# Patient Record
Sex: Female | Born: 1960
Health system: Southern US, Community
[De-identification: ages and names within clinical notes are randomized; demographics above are authoritative.]

## PROBLEM LIST (undated history)

## (undated) DIAGNOSIS — G43909 Migraine, unspecified, not intractable, without status migrainosus: Secondary | ICD-10-CM

## (undated) DIAGNOSIS — T7840XA Allergy, unspecified, initial encounter: Secondary | ICD-10-CM

## (undated) DIAGNOSIS — C50919 Malignant neoplasm of unspecified site of unspecified female breast: Secondary | ICD-10-CM

## (undated) DIAGNOSIS — G4730 Sleep apnea, unspecified: Secondary | ICD-10-CM

## (undated) DIAGNOSIS — R51 Headache: Secondary | ICD-10-CM

## (undated) DIAGNOSIS — R0789 Other chest pain: Secondary | ICD-10-CM

## (undated) DIAGNOSIS — C50411 Malignant neoplasm of upper-outer quadrant of right female breast: Secondary | ICD-10-CM

## (undated) DIAGNOSIS — R0600 Dyspnea, unspecified: Secondary | ICD-10-CM

## (undated) DIAGNOSIS — T8859XA Other complications of anesthesia, initial encounter: Secondary | ICD-10-CM

## (undated) DIAGNOSIS — D510 Vitamin B12 deficiency anemia due to intrinsic factor deficiency: Secondary | ICD-10-CM

## (undated) DIAGNOSIS — E039 Hypothyroidism, unspecified: Secondary | ICD-10-CM

## (undated) DIAGNOSIS — R310 Gross hematuria: Secondary | ICD-10-CM

## (undated) DIAGNOSIS — R0609 Other forms of dyspnea: Secondary | ICD-10-CM

## (undated) DIAGNOSIS — I10 Essential (primary) hypertension: Secondary | ICD-10-CM

## (undated) DIAGNOSIS — Z923 Personal history of irradiation: Secondary | ICD-10-CM

## (undated) DIAGNOSIS — M8580 Other specified disorders of bone density and structure, unspecified site: Secondary | ICD-10-CM

## (undated) DIAGNOSIS — Z1379 Encounter for other screening for genetic and chromosomal anomalies: Secondary | ICD-10-CM

## (undated) DIAGNOSIS — E785 Hyperlipidemia, unspecified: Secondary | ICD-10-CM

## (undated) DIAGNOSIS — D649 Anemia, unspecified: Secondary | ICD-10-CM

## (undated) DIAGNOSIS — Z8041 Family history of malignant neoplasm of ovary: Secondary | ICD-10-CM

## (undated) DIAGNOSIS — R002 Palpitations: Secondary | ICD-10-CM

## (undated) DIAGNOSIS — T4145XA Adverse effect of unspecified anesthetic, initial encounter: Secondary | ICD-10-CM

## (undated) DIAGNOSIS — R55 Syncope and collapse: Secondary | ICD-10-CM

## (undated) HISTORY — DX: Encounter for other screening for genetic and chromosomal anomalies: Z13.79

## (undated) HISTORY — DX: Vitamin B12 deficiency anemia due to intrinsic factor deficiency: D51.0

## (undated) HISTORY — PX: DILATION AND CURETTAGE OF UTERUS: SHX78

## (undated) HISTORY — DX: Other specified disorders of bone density and structure, unspecified site: M85.80

## (undated) HISTORY — DX: Other forms of dyspnea: R06.09

## (undated) HISTORY — PX: COLONOSCOPY: SHX174

## (undated) HISTORY — DX: Dyspnea, unspecified: R06.00

## (undated) HISTORY — DX: Malignant neoplasm of upper-outer quadrant of right female breast: C50.411

## (undated) HISTORY — DX: Hyperlipidemia, unspecified: E78.5

## (undated) HISTORY — DX: Migraine, unspecified, not intractable, without status migrainosus: G43.909

## (undated) HISTORY — DX: Syncope and collapse: R55

## (undated) HISTORY — PX: FUNCTIONAL ENDOSCOPIC SINUS SURGERY: SUR616

## (undated) HISTORY — DX: Headache: R51

## (undated) HISTORY — DX: Family history of malignant neoplasm of ovary: Z80.41

## (undated) HISTORY — DX: Allergy, unspecified, initial encounter: T78.40XA

## (undated) HISTORY — DX: Palpitations: R00.2

## (undated) HISTORY — DX: Anemia, unspecified: D64.9

## (undated) HISTORY — DX: Sleep apnea, unspecified: G47.30

## (undated) HISTORY — DX: Hypothyroidism, unspecified: E03.9

## (undated) HISTORY — PX: CHOLECYSTECTOMY: SHX55

## (undated) HISTORY — DX: Other chest pain: R07.89

## (undated) HISTORY — DX: Essential (primary) hypertension: I10

---
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
## 2003-09-18 ENCOUNTER — Other Ambulatory Visit: Payer: Self-pay

## 2004-04-01 ENCOUNTER — Ambulatory Visit: Payer: Self-pay | Admitting: Otolaryngology

## 2004-04-06 ENCOUNTER — Ambulatory Visit: Payer: Self-pay

## 2004-05-15 ENCOUNTER — Emergency Department: Payer: Self-pay | Admitting: Emergency Medicine

## 2005-04-07 ENCOUNTER — Ambulatory Visit: Payer: Self-pay

## 2006-09-19 ENCOUNTER — Ambulatory Visit: Payer: Self-pay | Admitting: Unknown Physician Specialty

## 2006-12-05 ENCOUNTER — Inpatient Hospital Stay: Payer: Self-pay | Admitting: Endocrinology

## 2008-01-02 ENCOUNTER — Ambulatory Visit: Payer: Self-pay

## 2008-01-02 ENCOUNTER — Ambulatory Visit: Payer: Self-pay | Admitting: Otolaryngology

## 2008-01-10 ENCOUNTER — Ambulatory Visit: Payer: Self-pay | Admitting: Otolaryngology

## 2008-02-08 DIAGNOSIS — G4730 Sleep apnea, unspecified: Secondary | ICD-10-CM

## 2008-02-08 HISTORY — DX: Sleep apnea, unspecified: G47.30

## 2008-10-21 ENCOUNTER — Observation Stay: Payer: Self-pay | Admitting: Surgery

## 2008-12-08 DIAGNOSIS — D510 Vitamin B12 deficiency anemia due to intrinsic factor deficiency: Secondary | ICD-10-CM | POA: Insufficient documentation

## 2009-02-07 HISTORY — PX: ABDOMINAL HYSTERECTOMY: SHX81

## 2009-05-01 ENCOUNTER — Ambulatory Visit: Payer: Self-pay

## 2009-05-08 ENCOUNTER — Ambulatory Visit: Payer: Self-pay | Admitting: Family Medicine

## 2009-12-30 ENCOUNTER — Ambulatory Visit: Payer: Self-pay

## 2010-01-07 ENCOUNTER — Ambulatory Visit: Payer: Self-pay

## 2010-01-09 LAB — PATHOLOGY REPORT

## 2010-07-12 ENCOUNTER — Ambulatory Visit: Payer: Self-pay

## 2010-12-27 ENCOUNTER — Ambulatory Visit: Payer: Self-pay | Admitting: Unknown Physician Specialty

## 2010-12-29 LAB — PATHOLOGY REPORT

## 2011-10-07 ENCOUNTER — Ambulatory Visit: Payer: Self-pay

## 2013-01-02 ENCOUNTER — Ambulatory Visit: Payer: Self-pay

## 2013-05-30 ENCOUNTER — Ambulatory Visit: Payer: Self-pay | Admitting: Family Medicine

## 2013-06-07 ENCOUNTER — Ambulatory Visit: Payer: Self-pay | Admitting: Family Medicine

## 2013-07-08 DIAGNOSIS — R51 Headache: Secondary | ICD-10-CM | POA: Insufficient documentation

## 2013-07-08 DIAGNOSIS — I10 Essential (primary) hypertension: Secondary | ICD-10-CM | POA: Insufficient documentation

## 2013-07-08 DIAGNOSIS — D649 Anemia, unspecified: Secondary | ICD-10-CM | POA: Insufficient documentation

## 2013-07-08 DIAGNOSIS — E785 Hyperlipidemia, unspecified: Secondary | ICD-10-CM | POA: Insufficient documentation

## 2013-07-08 DIAGNOSIS — R519 Headache, unspecified: Secondary | ICD-10-CM | POA: Insufficient documentation

## 2013-09-12 ENCOUNTER — Encounter: Payer: Self-pay | Admitting: *Deleted

## 2013-09-13 ENCOUNTER — Encounter: Payer: Self-pay | Admitting: Cardiovascular Disease

## 2013-09-13 ENCOUNTER — Ambulatory Visit (INDEPENDENT_AMBULATORY_CARE_PROVIDER_SITE_OTHER): Payer: 59 | Admitting: Cardiovascular Disease

## 2013-09-13 VITALS — BP 132/74 | HR 87 | Ht 61.0 in | Wt 270.2 lb

## 2013-09-13 DIAGNOSIS — R0609 Other forms of dyspnea: Secondary | ICD-10-CM | POA: Insufficient documentation

## 2013-09-13 DIAGNOSIS — R0602 Shortness of breath: Secondary | ICD-10-CM

## 2013-09-13 DIAGNOSIS — I10 Essential (primary) hypertension: Secondary | ICD-10-CM

## 2013-09-13 DIAGNOSIS — R0600 Dyspnea, unspecified: Secondary | ICD-10-CM | POA: Insufficient documentation

## 2013-09-13 DIAGNOSIS — R0789 Other chest pain: Secondary | ICD-10-CM

## 2013-09-13 MED ORDER — LOSARTAN POTASSIUM-HCTZ 50-12.5 MG PO TABS: 1.0000 | ORAL_TABLET | Freq: Every day | ORAL | Status: DC

## 2013-09-13 NOTE — Patient Instructions (Addendum)
Your physician has requested that you have an echocardiogram. Echocardiography is a painless test that uses sound waves to create images of your heart. It provides your doctor with information about the size and shape of your heart and how well your heart's chambers and valves are working. This procedure takes approximately one hour. There are no restrictions for this procedure.  Your physician has recommended you make the following change in your medication:  Stop Olmesartan  Start Losartan - HCTZ 50/12.5 mg once daily   Your physician recommends that you schedule a follow-up appointment in:  1 month

## 2013-09-13 NOTE — Progress Notes (Signed)
Primary care physician: Dr. Lovie Macadamia  HPI  This is a pleasant 53 year old female who is self-referred for evaluation of shortness of breath and hypertension. She has known history of hypertension, hypothyroidism, hyperlipidemia and obesity. She reports history of migraine headache which was being treated for verapamil which also treated hypertension. However, she had worsening control of blood pressure and was switched to diltiazem. She was also started on Olmesartan- hydrochlorothiazide. Since then, she had experienced significant diarrhea. She reports prolonged history of exertional dyspnea. She occasionally gets substernal chest pain and tightness mostly at rest when she is anxious. She has had increased stress at work recently. Recent labs were reviewed and overall were unremarkable. LDL was 127. TSH was normal. She had a previous stress test in 2008 which showed no evidence of ischemia. She is a lifelong nonsmoker and has no family history of premature coronary artery disease. She is a Naval architect with a stressful job.  Allergies  Allergen Reactions  . Niacin Anaphylaxis  . Iodine Hives  . Ivp Dye [Iodinated Diagnostic Agents]   . Morphine     Other reaction(s): Unknown  . Other     Other reaction(s): Unknown Contrast dye  . Penicillins     Other reaction(s): Unknown  . Shrimp [Shellfish Allergy]   . Cephalexin Rash  . Sulfa Antibiotics Rash     Current Outpatient Prescriptions on File Prior to Visit  Medication Sig Dispense Refill  . B Complex Vitamins (VITAMIN B COMPLEX IJ) Inject as directed every 30 (thirty) days.      . Cholecalciferol (VITAMIN D3) 2000 UNITS capsule Take 2,000 Units by mouth daily.      Marland Kitchen diltiazem (DILACOR XR) 240 MG 24 hr capsule Take 240 mg by mouth daily.      Marland Kitchen levothyroxine (SYNTHROID, LEVOTHROID) 150 MCG tablet Take 150 mcg by mouth daily before breakfast.       No current facility-administered medications on file prior to visit.      Past Medical History  Diagnosis Date  . Hyperlipidemia   . Headache(784.0)   . Anemia   . Pernicious anemia   . Sleep apnea   . Allergic state   . Hypothyroidism   . Migraines   . Osteopenia   . Hypertension      Past Surgical History  Procedure Laterality Date  . Cesarean section    . Cholecystectomy    . Abdominal hysterectomy    . Dilation and curettage of uterus    . Functional endoscopic sinus surgery    . Colonoscopy       Family History  Problem Relation Age of Onset  . Pulmonary embolism Mother   . Hypertension Mother   . Hyperlipidemia Mother   . Hypertension Father      History   Social History  . Marital Status: Married    Spouse Name: N/A    Number of Children: N/A  . Years of Education: N/A   Occupational History  . Not on file.   Social History Main Topics  . Smoking status: Never Smoker   . Smokeless tobacco: Not on file  . Alcohol Use: No  . Drug Use: No  . Sexual Activity: Not on file   Other Topics Concern  . Not on file   Social History Narrative  . No narrative on file     ROS A 10 point review of system was performed. It is negative other than that mentioned in the history of present illness.  PHYSICAL EXAM   BP 132/74  Pulse 87  Ht 5\' 1"  (1.549 m)  Wt 270 lb 4 oz (122.585 kg)  BMI 51.09 kg/m2 Constitutional: She is oriented to person, place, and time. She appears well-developed and well-nourished. No distress.  HENT: No nasal discharge.  Head: Normocephalic and atraumatic.  Eyes: Pupils are equal and round. No discharge.  Neck: Normal range of motion. Neck supple. No JVD present. No thyromegaly present.  Cardiovascular: Normal rate, regular rhythm, normal heart sounds. Exam reveals no gallop and no friction rub. No murmur heard.  Pulmonary/Chest: Effort normal and breath sounds normal. No stridor. No respiratory distress. She has no wheezes. She has no rales. She exhibits no tenderness.  Abdominal: Soft.  Bowel sounds are normal. She exhibits no distension. There is no tenderness. There is no rebound and no guarding.  Musculoskeletal: Normal range of motion. She exhibits no edema and no tenderness.  Neurological: She is alert and oriented to person, place, and time. Coordination normal.  Skin: Skin is warm and dry. No rash noted. She is not diaphoretic. No erythema. No pallor.  Psychiatric: She has a normal mood and affect. Her behavior is normal. Judgment and thought content normal.     [id number]  Rhythm  Low voltage in precordial leads.   ABNORMAL     ASSESSMENT AND PLAN

## 2013-09-13 NOTE — Assessment & Plan Note (Signed)
Exertional dyspnea is likely multifactorial due to obesity, physical deconditioning and possible diastolic heart failure. I requested an echocardiogram for evaluation. She also has multiple risk factors for coronary artery disease and at some point we should consider stress testing.

## 2013-09-13 NOTE — Assessment & Plan Note (Signed)
Blood pressure is reasonably controlled. However, she is having diarrhea likely related to treatment with olmesartan. Thus, I switched her to losartan-hydrochlorothiazide.

## 2013-09-27 ENCOUNTER — Other Ambulatory Visit: Payer: Self-pay

## 2013-09-27 ENCOUNTER — Other Ambulatory Visit (INDEPENDENT_AMBULATORY_CARE_PROVIDER_SITE_OTHER): Payer: 59

## 2013-09-27 DIAGNOSIS — R079 Chest pain, unspecified: Secondary | ICD-10-CM

## 2013-09-27 DIAGNOSIS — R0602 Shortness of breath: Secondary | ICD-10-CM

## 2013-10-01 ENCOUNTER — Telehealth: Payer: Self-pay | Admitting: *Deleted

## 2013-10-01 NOTE — Telephone Encounter (Signed)
Her BP machine is probably not accurate. It is unusual for diastolic pressure to be this high when the systolic pressure is completely normal.

## 2013-10-01 NOTE — Telephone Encounter (Signed)
Patient called again please use her work or cell phone.

## 2013-10-01 NOTE — Telephone Encounter (Signed)
LVM 8/25

## 2013-10-01 NOTE — Telephone Encounter (Signed)
Informed patient of Dr. Jacklynn Ganong response  She stated that she will stop on the way home from work today and have it checked at urgent care I instructed her to call and let us know if blood pressure if her SBP remains elevated  Patient verbalized understanding

## 2013-10-01 NOTE — Telephone Encounter (Signed)
Patient wanted to let Dr. Fletcher Anon know that her systolic pressure is still running high  On average blood pressure is around 115/95 Pressure has been reaching 115/105  Her meds were changed 08/07  Your physician has recommended you make the following change in your medication:  Stop Olmesartan  Start Losartan - HCTZ 50/12.5 mg once daily

## 2013-10-02 ENCOUNTER — Telehealth: Payer: Self-pay

## 2013-10-02 DIAGNOSIS — I10 Essential (primary) hypertension: Secondary | ICD-10-CM

## 2013-10-02 NOTE — Telephone Encounter (Signed)
Pt left vm last night at 6:30 stating she went to [facility] acute care for them to take her BP it was 164/96. States he home BP readings were accurate. Please call.

## 2013-10-02 NOTE — Telephone Encounter (Signed)
Patient went to urgent care after she called [place] yesterday regarding her elevated diastolic blood pressure  Her pressure at urgent care was 164/96

## 2013-10-03 MED ORDER — LOSARTAN POTASSIUM-HCTZ 100-25 MG PO TABS: 1.0000 | ORAL_TABLET | Freq: Every day | ORAL | Status: DC

## 2013-10-03 NOTE — Telephone Encounter (Signed)
Increase Losartan-HCTZ to 100-25 mg once daily. Check BMP in 1 week.

## 2013-10-03 NOTE — Telephone Encounter (Signed)
Informed patient of Dr. Jacklynn Ganong recommendation Appt scheduled  Patient verbalized understanding

## 2013-10-10 ENCOUNTER — Ambulatory Visit (INDEPENDENT_AMBULATORY_CARE_PROVIDER_SITE_OTHER): Payer: 59 | Admitting: *Deleted

## 2013-10-10 DIAGNOSIS — I10 Essential (primary) hypertension: Secondary | ICD-10-CM

## 2013-10-11 LAB — BASIC METABOLIC PANEL
BUN/Creatinine Ratio: 20 (ref 9–23)
BUN: 16 mg/dL (ref 6–24)
CALCIUM: 9.6 mg/dL (ref 8.7–10.2)
CHLORIDE: 97 mmol/L (ref 97–108)
CO2: 21 mmol/L (ref 18–29)
Creatinine, Ser: 0.79 mg/dL (ref 0.57–1.00)
GFR calc Af Amer: 99 mL/min/{1.73_m2} (ref >59)
GFR calc non Af Amer: 86 mL/min/{1.73_m2} (ref >59)
GLUCOSE: 88 mg/dL (ref 65–99)
Potassium: 4.3 mmol/L (ref 3.5–5.2)
Sodium: 140 mmol/L (ref 134–144)

## 2013-10-18 ENCOUNTER — Ambulatory Visit: Payer: 59 | Admitting: Cardiovascular Disease

## 2013-10-29 ENCOUNTER — Ambulatory Visit: Payer: 59 | Admitting: Cardiovascular Disease

## 2013-11-12 ENCOUNTER — Encounter: Payer: Self-pay | Admitting: Cardiovascular Disease

## 2013-11-12 ENCOUNTER — Ambulatory Visit (INDEPENDENT_AMBULATORY_CARE_PROVIDER_SITE_OTHER): Payer: 59 | Admitting: Cardiovascular Disease

## 2013-11-12 VITALS — BP 128/90 | HR 76 | Ht 61.0 in | Wt 273.2 lb

## 2013-11-12 DIAGNOSIS — R0602 Shortness of breath: Secondary | ICD-10-CM

## 2013-11-12 DIAGNOSIS — I10 Essential (primary) hypertension: Secondary | ICD-10-CM

## 2013-11-12 NOTE — Assessment & Plan Note (Signed)
She has not been able to lose weight in spite of multiple attempts. I discussed the option of surgical weight loss and referred her to the weight loss clinic at [facility].

## 2013-11-12 NOTE — Progress Notes (Signed)
Primary care physician: Dr. Lovie Macadamia  HPI  This is a pleasant 53 year old female who is here today for a followup visit regarding  shortness of breath and hypertension. She has known history of hypertension, hypothyroidism, hyperlipidemia and obesity. She reports history of migraine headache which was being treated for verapamil which also treated hypertension. However, she had worsening control of blood pressure and was switched to diltiazem. During last visit, I switched her from Olmesartan- hydrochlorothiazide to losartan- hydrochlorothiazide due to diarrhea.  Blood pressure improved after increasing the dose. She reports resolution of diarrhea and overall feeling better. She continues to have significant exertional dyspnea and she has been trying to exercise on the elliptical but there is still limitations related to dyspnea. She denies any chest discomfort. Echocardiogram was normal.  Allergies  Allergen Reactions  . Niacin Anaphylaxis  . Iodine Hives  . Ivp Dye [Iodinated Diagnostic Agents]   . Morphine     Other reaction(s): Unknown  . Other     Other reaction(s): Unknown Contrast dye  . Penicillins     Other reaction(s): Unknown  . Shrimp [Shellfish Allergy]   . Cephalexin Rash  . Sulfa Antibiotics Rash     Current Outpatient Prescriptions on File Prior to Visit  Medication Sig Dispense Refill  . B Complex Vitamins (VITAMIN B COMPLEX IJ) Inject as directed every 30 (thirty) days.      . Cholecalciferol (VITAMIN D3) 2000 UNITS capsule Take 2,000 Units by mouth daily.      Marland Kitchen diltiazem (DILACOR XR) 240 MG 24 hr capsule Take 240 mg by mouth daily.      Marland Kitchen levothyroxine (SYNTHROID, LEVOTHROID) 150 MCG tablet Take 150 mcg by mouth daily before breakfast.      . losartan-hydrochlorothiazide (HYZAAR) 100-25 MG per tablet Take 1 tablet by mouth daily.  90 tablet  3   No current facility-administered medications on file prior to visit.     Past Medical History  Diagnosis Date  .  Hyperlipidemia   . Headache(784.0)   . Anemia   . Pernicious anemia   . Sleep apnea   . Allergic state   . Hypothyroidism   . Migraines   . Osteopenia   . Hypertension      Past Surgical History  Procedure Laterality Date  . Cesarean section    . Cholecystectomy    . Abdominal hysterectomy    . Dilation and curettage of uterus    . Functional endoscopic sinus surgery    . Colonoscopy       Family History  Problem Relation Age of Onset  . Pulmonary embolism Mother   . Hypertension Mother   . Hyperlipidemia Mother   . Hypertension Father      History   Social History  . Marital Status: Married    Spouse Name: N/A    Number of Children: N/A  . Years of Education: N/A   Occupational History  . Not on file.   Social History Main Topics  . Smoking status: Never Smoker   . Smokeless tobacco: Not on file  . Alcohol Use: No  . Drug Use: No  . Sexual Activity: Not on file   Other Topics Concern  . Not on file   Social History Narrative  . No narrative on file     ROS A 10 point review of system was performed. It is negative other than that mentioned in the history of present illness.   PHYSICAL EXAM   BP 128/90  Pulse 76  Ht 5\' 1"  (1.549 m)  Wt 273 lb 4 oz (123.945 kg)  BMI 51.66 kg/m2 Constitutional: She is oriented to person, place, and time. She appears well-developed and well-nourished. No distress.  HENT: No nasal discharge.  Head: Normocephalic and atraumatic.  Eyes: Pupils are equal and round. No discharge.  Neck: Normal range of motion. Neck supple. No JVD present. No thyromegaly present.  Cardiovascular: Normal rate, regular rhythm, normal heart sounds. Exam reveals no gallop and no friction rub. No murmur heard.  Pulmonary/Chest: Effort normal and breath sounds normal. No stridor. No respiratory distress. She has no wheezes. She has no rales. She exhibits no tenderness.  Abdominal: Soft. Bowel sounds are normal. She exhibits no distension.  There is no tenderness. There is no rebound and no guarding.  Musculoskeletal: Normal range of motion. She exhibits no edema and no tenderness.  Neurological: She is alert and oriented to person, place, and time. Coordination normal.  Skin: Skin is warm and dry. No rash noted. She is not diaphoretic. No erythema. No pallor.  Psychiatric: She has a normal mood and affect. Her behavior is normal. Judgment and thought content normal.        ASSESSMENT AND PLAN

## 2013-11-12 NOTE — Assessment & Plan Note (Signed)
Blood pressure is now well controlled on current medications without significant side effects. Continue same medications.

## 2013-11-12 NOTE — Patient Instructions (Addendum)
[place]  Your caregiver has ordered a Stress Test with nuclear imaging. The purpose of this test is to evaluate the blood supply to your heart muscle. This procedure is referred to as a "Non-Invasive Stress Test." This is because other than having an IV started in your vein, nothing is inserted or "invades" your body. Cardiac stress tests are done to find areas of poor blood flow to the heart by determining the extent of coronary artery disease (CAD). Some patients exercise on a treadmill, which naturally increases the blood flow to your heart, while others who are  unable to walk on a treadmill due to physical limitations have a pharmacologic/chemical stress agent called Lexiscan . This medicine will mimic walking on a treadmill by temporarily increasing your coronary blood flow.   Please note: these test may take anywhere between 2-4 hours to complete  PLEASE REPORT TO [facility] AT THE FIRST DESK WILL DIRECT YOU WHERE TO GO  Date of Procedure:________10/16/15_____________________________  Arrival Time for Procedure:____0745 am__________________________  PLEASE NOTIFY THE OFFICE AT LEAST 24 HOURS IN ADVANCE IF YOU ARE UNABLE TO KEEP YOUR APPOINTMENT.  [phone] AND  PLEASE NOTIFY NUCLEAR MEDICINE AT [facility] AT LEAST 24 HOURS IN ADVANCE IF YOU ARE UNABLE TO KEEP YOUR APPOINTMENT. [phone]  How to prepare for your Myoview test:  1. Do not eat or drink after midnight 2. No caffeine for 24 hours prior to test 3. No smoking 24 hours prior to test. 4. Your medication may be taken with water.  If your doctor stopped a medication because of this test, do not take that medication. 5. Ladies, please do not wear dresses.  Skirts or pants are appropriate. Please wear a short sleeve shirt. 6. No perfume, cologne or lotion. 7. Wear comfortable walking shoes. No heels!   You registered for [facility] for Weight Loss  [facility]   Tuesday, November 26, 2013  6-8 pm  [phone] Call for changes or questions   Your physician wants you to follow-up in: 6 months with Dr. Fletcher Anon. You will receive a reminder letter in the mail two months in advance. If you don't receive a letter, please call our office to schedule the follow-up appointment.

## 2013-11-12 NOTE — Assessment & Plan Note (Signed)
Echocardiogram was normal. She continues to have significant exertional dyspnea without chest discomfort. I recommend evaluation with a pharmacologic nuclear stress test to rule out ischemic etiology. She is not able to exercise on a treadmill due to significant knee arthritis.

## 2013-11-22 ENCOUNTER — Ambulatory Visit: Payer: Self-pay | Admitting: Cardiovascular Disease

## 2013-11-22 DIAGNOSIS — R0602 Shortness of breath: Secondary | ICD-10-CM

## 2013-11-25 ENCOUNTER — Telehealth: Payer: Self-pay | Admitting: *Deleted

## 2013-11-25 ENCOUNTER — Other Ambulatory Visit: Payer: Self-pay

## 2013-11-25 DIAGNOSIS — R0602 Shortness of breath: Secondary | ICD-10-CM

## 2013-11-25 NOTE — Telephone Encounter (Signed)
Please call patient. She wants her stress test results

## 2013-11-27 NOTE — Telephone Encounter (Signed)
See result note.  

## 2014-01-06 ENCOUNTER — Telehealth: Payer: Self-pay

## 2014-01-06 NOTE — Telephone Encounter (Signed)
Pt called, states she has been on new BP medication, and asks does she need to have her labs rechecked. Please call and advise.

## 2014-01-06 NOTE — Telephone Encounter (Signed)
I spoke with the patient and informed her that we should not need to recheck her labs until she follows up with Dr. Fletcher Anon in 6 months. I have advised her if she starts to have persistent leg cramping to call and let us know and we can recheck her potassium since she is on the losartan/ hctz.

## 2014-02-04 LAB — HM PAP SMEAR

## 2014-02-07 DIAGNOSIS — Z1379 Encounter for other screening for genetic and chromosomal anomalies: Secondary | ICD-10-CM

## 2014-02-07 HISTORY — DX: Encounter for other screening for genetic and chromosomal anomalies: Z13.79

## 2014-02-15 DIAGNOSIS — R310 Gross hematuria: Secondary | ICD-10-CM | POA: Insufficient documentation

## 2014-03-10 ENCOUNTER — Ambulatory Visit: Payer: Self-pay

## 2014-03-10 LAB — HM MAMMOGRAPHY

## 2014-08-19 ENCOUNTER — Telehealth: Payer: Self-pay | Admitting: Cardiovascular Disease

## 2014-08-19 NOTE — Telephone Encounter (Signed)
Pt c/o Syncope: STAT if syncope occurred within 30 minutes and pt complains of lightheadedness High Priority if episode of passing out, completely, today or in last 24 hours   1. Did you pass out today? no  2. When is the last time you passed out?  About 1 month ago   3. Has this occurred multiple times? Yes, twice   4. Did you have any symptoms prior to passing out?  Checked bp after 80's/40's        Patient was rx 2 additional bp meds by Dr. Lovie Macadamia and the 2 times she took she passed out as her bp dropped too low and she has not taken              since those episodes and has since not had any episodes    bp readings have been ok lately  120's/70's  140's/90's  sometimes high in the evening as she has a stressful day

## 2014-08-20 NOTE — Telephone Encounter (Signed)
S/w pt who indicated her PCP ordered additional BP med in May due to elevated BP at [facility]. States when she took additional med (she thinks it is coreg), she felt dizzy, pressure was 80s/40s. Waited few days, took med again and became symptomatic again. Has not taken additional BP med since then and states pressure in 120s. Suggested to pt she keep daily BP log and medications taken and bring to August appt with Arida. Pt verbalized understanding with no further questions.

## 2014-09-30 ENCOUNTER — Encounter: Payer: Self-pay | Admitting: Cardiovascular Disease

## 2014-09-30 ENCOUNTER — Ambulatory Visit (INDEPENDENT_AMBULATORY_CARE_PROVIDER_SITE_OTHER): Payer: 59 | Admitting: Cardiovascular Disease

## 2014-09-30 VITALS — BP 156/82 | HR 89 | Ht 61.0 in | Wt 275.2 lb

## 2014-09-30 DIAGNOSIS — R0602 Shortness of breath: Secondary | ICD-10-CM | POA: Diagnosis not present

## 2014-09-30 DIAGNOSIS — I10 Essential (primary) hypertension: Secondary | ICD-10-CM

## 2014-09-30 NOTE — Assessment & Plan Note (Signed)
She is considering lap band procedure. No preoperative cardiovascular evaluation would be needed given that her test were normal last year. She is at low risk from a cardiac standpoint.

## 2014-09-30 NOTE — Assessment & Plan Note (Signed)
Blood pressure is overall reasonably controlled. I'm hesitant to increase or add other antihypertensives medications given that she occasionally has low blood pressure readings at home. Continue observation and lifestyle changes.

## 2014-09-30 NOTE — Progress Notes (Signed)
Primary care physician: Dr. Lovie Macadamia  HPI  This is a pleasant 54 year old female who is here today for a followup visit regarding  shortness of breath and hypertension. She has known history of hypertension, hypothyroidism, sleep apnea on CPAP, migraine, hyperlipidemia and obesity.  dyspnea. She denies any chest discomfort.  Echocardiogram in August 2015 was normal. Nuclear stress test in March 2015 was done or shortness of breath and was normal. Blood pressure has been reasonably controlled on diltiazem and losartan-hydrochlorothiazide. She reports no side effects. She has been more busy lately at work and has been traveling frequently. I reviewed her home blood pressure readings and she had few readings about 540 systolic. At the same time, she had readings of 981 systolic. The blood pressure tends to fluctuate.  Allergies  Allergen Reactions  . Niacin Anaphylaxis  . Iodine Hives  . Ivp Dye [Iodinated Diagnostic Agents]   . Morphine     Other reaction(s): Unknown  . Other     Other reaction(s): Unknown Contrast dye  . Penicillins     Other reaction(s): Unknown  . Shrimp [Shellfish Allergy]   . Cephalexin Rash  . Sulfa Antibiotics Rash     Current Outpatient Prescriptions on File Prior to Visit  Medication Sig Dispense Refill  . B Complex Vitamins (VITAMIN B COMPLEX IJ) Inject as directed every 30 (thirty) days.    . Cholecalciferol (VITAMIN D3) 2000 UNITS capsule Take 4,000 Units by mouth daily.     Marland Kitchen diltiazem (DILACOR XR) 240 MG 24 hr capsule Take 240 mg by mouth daily.    Marland Kitchen levothyroxine (SYNTHROID, LEVOTHROID) 150 MCG tablet Take 150 mcg by mouth daily before breakfast.    . losartan-hydrochlorothiazide (HYZAAR) 100-25 MG per tablet Take 1 tablet by mouth daily. 90 tablet 3   No current facility-administered medications on file prior to visit.     Past Medical History  Diagnosis Date  . Hyperlipidemia   . Headache(784.0)   . Anemia   . Pernicious anemia   . Sleep  apnea   . Allergic state   . Hypothyroidism   . Migraines   . Osteopenia   . Hypertension      Past Surgical History  Procedure Laterality Date  . Cesarean section    . Cholecystectomy    . Abdominal hysterectomy    . Dilation and curettage of uterus    . Functional endoscopic sinus surgery    . Colonoscopy       Family History  Problem Relation Age of Onset  . Pulmonary embolism Mother   . Hypertension Mother   . Hyperlipidemia Mother   . Hypertension Father      Social History   Social History  . Marital Status: Married    Spouse Name: N/A  . Number of Children: N/A  . Years of Education: N/A   Occupational History  . Not on file.   Social History Main Topics  . Smoking status: Never Smoker   . Smokeless tobacco: Not on file  . Alcohol Use: No  . Drug Use: No  . Sexual Activity: Not on file   Other Topics Concern  . Not on file   Social History Narrative     ROS A 10 point review of system was performed. It is negative other than that mentioned in the history of present illness.   PHYSICAL EXAM   BP 156/82 mmHg  Pulse 89  Ht 5\' 1"  (1.549 m)  Wt 275 lb 4 oz (124.853 kg)  BMI 52.04 kg/m2 Constitutional: She is oriented to person, place, and time. She appears well-developed and well-nourished. No distress.  HENT: No nasal discharge.  Head: Normocephalic and atraumatic.  Eyes: Pupils are equal and round. No discharge.  Neck: Normal range of motion. Neck supple. No JVD present. No thyromegaly present.  Cardiovascular: Normal rate, regular rhythm, normal heart sounds. Exam reveals no gallop and no friction rub. No murmur heard.  Pulmonary/Chest: Effort normal and breath sounds normal. No stridor. No respiratory distress. She has no wheezes. She has no rales. She exhibits no tenderness.  Abdominal: Soft. Bowel sounds are normal. She exhibits no distension. There is no tenderness. There is no rebound and no guarding.  Musculoskeletal: Normal range  of motion. She exhibits no edema and no tenderness.  Neurological: She is alert and oriented to person, place, and time. Coordination normal.  Skin: Skin is warm and dry. No rash noted. She is not diaphoretic. No erythema. No pallor.  Psychiatric: She has a normal mood and affect. Her behavior is normal. Judgment and thought content normal.     BWL:SLHTD  Rhythm  WITHIN NORMAL LIMITS   ASSESSMENT AND PLAN

## 2014-09-30 NOTE — Assessment & Plan Note (Signed)
There is probably a component of physical deconditioning. Cardiac workup last year was negative including an echocardiogram and a stress test.

## 2014-09-30 NOTE — Patient Instructions (Signed)
Medication Instructions: Continue same medications.   Labwork: None.   Procedures/Testing: None.   Follow-Up: Follow up as needed.   Any Additional Special Instructions Will Be Listed Below (If Applicable).

## 2014-10-07 ENCOUNTER — Encounter: Payer: Self-pay | Admitting: Urgent Care

## 2014-10-07 ENCOUNTER — Emergency Department
Admission: EM | Admit: 2014-10-07 | Discharge: 2014-10-08 | Disposition: A | Discharge status: Home / Self Care | Payer: 59 | Attending: Student | Admitting: Student

## 2014-10-07 ENCOUNTER — Emergency Department: Payer: 59

## 2014-10-07 DIAGNOSIS — Z79899 Other long term (current) drug therapy: Secondary | ICD-10-CM | POA: Diagnosis not present

## 2014-10-07 DIAGNOSIS — R109 Unspecified abdominal pain: Secondary | ICD-10-CM

## 2014-10-07 DIAGNOSIS — N390 Urinary tract infection, site not specified: Secondary | ICD-10-CM

## 2014-10-07 DIAGNOSIS — I10 Essential (primary) hypertension: Secondary | ICD-10-CM | POA: Insufficient documentation

## 2014-10-07 DIAGNOSIS — Z880 Allergy status to penicillin: Secondary | ICD-10-CM | POA: Diagnosis not present

## 2014-10-07 DIAGNOSIS — R1031 Right lower quadrant pain: Secondary | ICD-10-CM | POA: Diagnosis present

## 2014-10-07 LAB — COMPREHENSIVE METABOLIC PANEL
ALBUMIN: 4.4 g/dL (ref 3.5–5.0)
ALK PHOS: 82 U/L (ref 38–126)
ALT: 18 U/L (ref 14–54)
AST: 25 U/L (ref 15–41)
Anion gap: 11 (ref 5–15)
BILIRUBIN TOTAL: 1 mg/dL (ref 0.3–1.2)
BUN: 18 mg/dL (ref 6–20)
CALCIUM: 9.8 mg/dL (ref 8.9–10.3)
CO2: 28 mmol/L (ref 22–32)
CREATININE: 0.78 mg/dL (ref 0.44–1.00)
Chloride: 100 mmol/L — ABNORMAL LOW (ref 101–111)
GFR calc Af Amer: >60 mL/min (ref >60)
GFR calc non Af Amer: >60 mL/min (ref >60)
GLUCOSE: 111 mg/dL — AB (ref 65–99)
Potassium: 3.5 mmol/L (ref 3.5–5.1)
Sodium: 139 mmol/L (ref 135–145)
TOTAL PROTEIN: 7.8 g/dL (ref 6.5–8.1)

## 2014-10-07 LAB — CBC
HEMATOCRIT: 44.3 % (ref 35.0–47.0)
HEMOGLOBIN: 14.9 g/dL (ref 12.0–16.0)
MCH: 29.3 pg (ref 26.0–34.0)
MCHC: 33.7 g/dL (ref 32.0–36.0)
MCV: 86.9 fL (ref 80.0–100.0)
Platelets: 322 10*3/uL (ref 150–440)
RBC: 5.09 MIL/uL (ref 3.80–5.20)
RDW: 13.5 % (ref 11.5–14.5)
WBC: 10.9 10*3/uL (ref 3.6–11.0)

## 2014-10-07 LAB — LIPASE, BLOOD: Lipase: 19 U/L — ABNORMAL LOW (ref 22–51)

## 2014-10-07 MED ORDER — SODIUM CHLORIDE 0.9 % IV BOLUS (SEPSIS)
1000.0000 mL | Freq: Once | INTRAVENOUS | Status: AC
  Administered 2014-10-07: 1000 mL via INTRAVENOUS

## 2014-10-07 MED ORDER — LORAZEPAM 2 MG/ML IJ SOLN
INTRAMUSCULAR | Status: AC
  Filled 2014-10-07: qty 1

## 2014-10-07 NOTE — ED Notes (Signed)
Patient presents with c/o SIGHT side abd pain; worse in RLQ. (+) nausea reported. Denies urinary symptoms. PMH significant for ovarian cysts.

## 2014-10-07 NOTE — ED Notes (Signed)
Patient offered IV interventions for pain, however declined citing that she has allergies to many medications. States, "I do not do well with narcotics." Patient initially anxious in triage, however when moved to subwait area and positioned for comfort in recliner her anxiety level decreased and patient appears to be more comfortable. Patient made aware of the fact that this RN had spoken with MD regarding her presenting c/o and triage assessment; MD had given orders for CT scan and IVFs; updated on POC as it stands at this point. Charge nurse made aware of the fact that patient is in [place] and will need a bed secondary to her acute onset of pain.

## 2014-10-07 NOTE — ED Provider Notes (Signed)
[facility] Emergency Department Provider Note  ____________________________________________  Time seen: Approximately 10:37 PM  I have reviewed the triage vital signs and the nursing notes.   HISTORY  Chief Complaint Abdominal Pain    HPI Tammy Turner is a 54 y.o. female With hyperlipidemia, pernicious anemia, hypertension, history of ovarian cysts, status post cholecystectomy, 2 x C-sections, hysterectomy who presents for evaluation of gradual onset constant throbbing right lower quadrant pain today. She has had nausea but no vomiting, no diarrhea, no fevers or chills. No chest pain or difficulty breathing. No abnormal vaginal bleeding or vaginal discharge.she denies any concern for sexual transmitted infection, she is in a mutual monogamous sexual relationship. No modifying factors although she reports her pain was severe initially however she received Ativan prior to me seeing her and she reports her pain is significantly improved at this time.   Past Medical History  Diagnosis Date  . Hyperlipidemia   . Headache(784.0)   . Anemia   . Pernicious anemia   . Sleep apnea   . Allergic state   . Hypothyroidism   . Migraines   . Osteopenia   . Hypertension     Patient Active Problem List   Diagnosis Date Noted  . Morbid obesity 11/12/2013  . SOB (shortness of breath) 09/13/2013  . Hypertension     Past Surgical History  Procedure Laterality Date  . Cesarean section    . Cholecystectomy    . Abdominal hysterectomy    . Dilation and curettage of uterus    . Functional endoscopic sinus surgery    . Colonoscopy      Current Outpatient Rx  Name  Route  Sig  Dispense  Refill  . B Complex Vitamins (VITAMIN B COMPLEX IJ)   Injection   Inject as directed every 30 (thirty) days.         . Cholecalciferol (VITAMIN D3) 2000 UNITS capsule   Oral   Take 4,000 Units by mouth daily.          Marland Kitchen diltiazem (DILACOR XR) 240 MG 24 hr capsule   Oral    Take 240 mg by mouth daily.         Marland Kitchen levothyroxine (SYNTHROID, LEVOTHROID) 150 MCG tablet   Oral   Take 150 mcg by mouth daily before breakfast.         . losartan-hydrochlorothiazide (HYZAAR) 100-25 MG per tablet   Oral   Take 1 tablet by mouth daily.   90 tablet   3     Allergies Niacin; Iodine; Ivp dye; Morphine; Other; Penicillins; Shrimp; Cephalexin; and Sulfa antibiotics  Family History  Problem Relation Age of Onset  . Pulmonary embolism Mother   . Hypertension Mother   . Hyperlipidemia Mother   . Hypertension Father     Social History Social History  Substance Use Topics  . Smoking status: Never Smoker   . Smokeless tobacco: None  . Alcohol Use: No    Review of Systems Constitutional: No fever/chills Eyes: No visual changes. ENT: No sore throat. Cardiovascular: Denies chest pain. Respiratory: Denies shortness of breath. Gastrointestinal: + abdominal pain.  + nausea, no vomiting.  No diarrhea.  No constipation. Genitourinary: Negative for dysuria. Musculoskeletal: Negative for back pain. Skin: Negative for rash. Neurological: Negative for headaches, focal weakness or numbness.  10-point ROS otherwise negative.  ____________________________________________   PHYSICAL EXAM:  VITAL SIGNS: ED Triage Vitals  Enc Vitals Group     BP 10/07/14 2049 134/94 mmHg  Pulse Rate 10/07/14 2049 95     Resp 10/07/14 2049 22     Temp 10/07/14 2049 98.1 F (36.7 C)     Temp Source 10/07/14 2049 Oral     SpO2 10/07/14 2049 96 %     Weight 10/07/14 2049 268 lb (121.564 kg)     Height 10/07/14 2049 5\' 3"  (1.6 m)     Head Cir --      Peak Flow --      Pain Score 10/07/14 2050 10     Pain Loc --      Pain Edu? --      Excl. in [place]? --     Constitutional: Alert and oriented. Well appearing and in no acute distress. Eyes: Conjunctivae are normal. PERRL. EOMI. Head: Atraumatic. Nose: No congestion/rhinnorhea. Mouth/Throat: Mucous membranes are moist.   Oropharynx non-erythematous. Neck: No stridor.   Cardiovascular: Normal rate, regular rhythm. Grossly normal heart sounds.  Good peripheral circulation. Respiratory: Normal respiratory effort.  No retractions. Lungs CTAB. Gastrointestinal: Soft with mild right lower quadrant tenderness. Normal bowel sounds. Obese body habitus. No distention. No abdominal bruits. No CVA tenderness. Genitourinary: deferred Musculoskeletal: No lower extremity tenderness nor edema.  No joint effusions. Neurologic:  Normal speech and language. No gross focal neurologic deficits are appreciated. No gait instability. Skin:  Skin is warm, dry and intact. No rash noted. Psychiatric: Mood and affect are normal. Speech and behavior are normal.  ____________________________________________   LABS (all labs ordered are listed, but only abnormal results are displayed)  Labs Reviewed  LIPASE, BLOOD - Abnormal; Notable for the following:    Lipase 19 (*)    All other components within normal limits  COMPREHENSIVE METABOLIC PANEL - Abnormal; Notable for the following:    Chloride 100 (*)    Glucose, Bld 111 (*)    All other components within normal limits  CBC  URINALYSIS COMPLETEWITH MICROSCOPIC ([facility] ONLY)   ____________________________________________  EKG  none ____________________________________________  RADIOLOGY  CT abdomen and pelvis  FINDINGS: The visualized lung bases are clear. A tiny hiatal hernia is noted.  The liver and spleen are unremarkable in appearance. The patient is status post cholecystectomy, with clips noted at the gallbladder fossa. The pancreas and adrenal glands are unremarkable.  The kidneys are unremarkable in appearance. There is no evidence of hydronephrosis. No renal or ureteral stones are seen. No perinephric stranding is appreciated.  No free fluid is identified. The small bowel is unremarkable in appearance. The stomach is within normal limits. No acute  vascular abnormalities are seen.  The appendix is normal in caliber, without evidence for appendicitis. The colon is unremarkable in appearance.  The bladder is mildly distended and grossly unremarkable. The patient is status post hysterectomy. The ovaries are grossly symmetric. No suspicious adnexal masses are seen. No inguinal lymphadenopathy is seen.  No acute osseous abnormalities are identified.  IMPRESSION: 1. No acute abnormality seen within the abdomen or pelvis. 2. Tiny hiatal hernia noted.  ____________________________________________   PROCEDURES  Procedure(s) performed: None  Critical Care performed: No  ____________________________________________   INITIAL IMPRESSION / ASSESSMENT AND PLAN / ED COURSE  Pertinent labs & imaging results that were available during my care of the patient were reviewed by me and considered in my medical decision making (see chart for details).  Tammy Turner is a 54 y.o. female With hyperlipidemia, pernicious anemia, hypertension, history of ovarian cysts, status post cholecystectomy, 2 x C-sections, hysterectomy who presents for evaluation of gradual onset  constant throbbing right lower quadrant pain today. On exam, she is well-appearing and in NAD. VSS and she is afebrile. She has mild right lower quadrant tenderness to palpation.CBC normal. Normal CMP. Normal lipase. CT of the abdomen and pelvis was obtained without contrast due to the patient's iodine allergy. Appendix is normal. Ovaries appear grossly normal bilaterally and no suspicious masses are noted making acute torsion less likely. She is status post hysterectomy. There is no acute abnormality noted on her CT scan; no CT findings to explain her pain. Her pain is improved at this time. We discussed extensive and meticulous return precautions and she is comfortable with discharge plan. Her urinalysis is pending. Care transferred to Dr. Beather Arbour at this 11:45 pm pending urinalysis after  which anticipate discharge home with close PCP follow-up. ____________________________________________   FINAL CLINICAL IMPRESSION(S) / ED DIAGNOSES  Final diagnoses:  RLQ abdominal pain      Joanne Gavel, MD 10/07/14 2355

## 2014-10-08 LAB — URINALYSIS COMPLETE WITH MICROSCOPIC (ARMC ONLY)
BACTERIA UA: NONE SEEN
Bilirubin Urine: NEGATIVE
GLUCOSE, UA: NEGATIVE mg/dL
Ketones, ur: NEGATIVE mg/dL
Nitrite: NEGATIVE
Protein, ur: NEGATIVE mg/dL
Specific Gravity, Urine: 1.008 (ref 1.005–1.030)
pH: 7 (ref 5.0–8.0)

## 2014-10-08 MED ORDER — NITROFURANTOIN MONOHYD MACRO 100 MG PO CAPS: 100.0000 mg | ORAL_CAPSULE | Freq: Two times a day (BID) | ORAL | Status: DC

## 2014-10-08 MED ORDER — NITROFURANTOIN MONOHYD MACRO 100 MG PO CAPS
100.0000 mg | ORAL_CAPSULE | Freq: Once | ORAL | Status: AC
  Administered 2014-10-08: 100 mg via ORAL
  Filled 2014-10-08: qty 1

## 2014-10-08 NOTE — Discharge Instructions (Signed)
1. Take antibiotics as prescribed (Macrobid 100 mg twice daily 7 days). 2. Return to the ER for worsening symptoms, persistent vomiting, fever or other concerns.  Abdominal Pain Many things can cause abdominal pain. Usually, abdominal pain is not caused by a disease and will improve without treatment. It can often be observed and treated at home. Your health care provider will do a physical exam and possibly order blood tests and X-rays to help determine the seriousness of your pain. However, in many cases, more time must pass before a clear cause of the pain can be found. Before that point, your health care provider may not know if you need more testing or further treatment. HOME CARE INSTRUCTIONS  Monitor your abdominal pain for any changes. The following actions may help to alleviate any discomfort you are experiencing:  Only take over-the-counter or prescription medicines as directed by your health care provider.  Do not take laxatives unless directed to do so by your health care provider.  Try a clear liquid diet (broth, tea, or water) as directed by your health care provider. Slowly move to a bland diet as tolerated. SEEK MEDICAL CARE IF:  You have unexplained abdominal pain.  You have abdominal pain associated with nausea or diarrhea.  You have pain when you urinate or have a bowel movement.  You experience abdominal pain that wakes you in the night.  You have abdominal pain that is worsened or improved by eating food.  You have abdominal pain that is worsened with eating fatty foods.  You have a fever. SEEK IMMEDIATE MEDICAL CARE IF:   Your pain does not go away within 2 hours.  You keep throwing up (vomiting).  Your pain is felt only in portions of the abdomen, such as the right side or the left lower portion of the abdomen.  You pass bloody or Freund tarry stools. MAKE SURE YOU:  Understand these instructions.   Will watch your condition.   Will get help right  away if you are not doing well or get worse.  Document Released: 11/03/2004 Document Revised: 01/29/2013 Document Reviewed: 10/03/2012 [facility] Patient Information 2015 [place], [place]. This information is not intended to replace advice given to you by your health care provider. Make sure you discuss any questions you have with your health care provider.  Urinary Tract Infection Urinary tract infections (UTIs) can develop anywhere along your urinary tract. Your urinary tract is your body's drainage system for removing wastes and extra water. Your urinary tract includes two kidneys, two ureters, a bladder, and a urethra. Your kidneys are a pair of bean-shaped organs. Each kidney is about the size of your fist. They are located below your ribs, one on each side of your spine. CAUSES Infections are caused by microbes, which are microscopic organisms, including fungi, viruses, and bacteria. These organisms are so small that they can only be seen through a microscope. Bacteria are the microbes that most commonly cause UTIs. SYMPTOMS  Symptoms of UTIs may vary by age and gender of the patient and by the location of the infection. Symptoms in young women typically include a frequent and intense urge to urinate and a painful, burning feeling in the bladder or urethra during urination. Older women and men are more likely to be tired, shaky, and weak and have muscle aches and abdominal pain. A fever may mean the infection is in your kidneys. Other symptoms of a kidney infection include pain in your back or sides below the ribs, nausea,  and vomiting. DIAGNOSIS To diagnose a UTI, your caregiver will ask you about your symptoms. Your caregiver also will ask to provide a urine sample. The urine sample will be tested for bacteria and white blood cells. White blood cells are made by your body to help fight infection. TREATMENT  Typically, UTIs can be treated with medication. Because most UTIs are caused by a bacterial  infection, they usually can be treated with the use of antibiotics. The choice of antibiotic and length of treatment depend on your symptoms and the type of bacteria causing your infection. HOME CARE INSTRUCTIONS  If you were prescribed antibiotics, take them exactly as your caregiver instructs you. Finish the medication even if you feel better after you have only taken some of the medication.  Drink enough water and fluids to keep your urine clear or pale yellow.  Avoid caffeine, tea, and carbonated beverages. They tend to irritate your bladder.  Empty your bladder often. Avoid holding urine for long periods of time.  Empty your bladder before and after sexual intercourse.  After a bowel movement, women should cleanse from front to back. Use each tissue only once. SEEK MEDICAL CARE IF:   You have back pain.  You develop a fever.  Your symptoms do not begin to resolve within 3 days. SEEK IMMEDIATE MEDICAL CARE IF:   You have severe back pain or lower abdominal pain.  You develop chills.  You have nausea or vomiting.  You have continued burning or discomfort with urination. MAKE SURE YOU:   Understand these instructions.  Will watch your condition.  Will get help right away if you are not doing well or get worse. Document Released: 11/03/2004 Document Revised: 07/26/2011 Document Reviewed: 03/04/2011 [facility] Patient Information 2015 [place], [place]. This information is not intended to replace advice given to you by your health care provider. Make sure you discuss any questions you have with your health care provider.

## 2014-10-08 NOTE — ED Provider Notes (Signed)
-----------------------------------------   12:53 AM on 10/08/2014 -----------------------------------------  Urinalysis noted with 2+ leukocytes, WBCs 6-30. Updated patient and spouse of results. Extensive discussion with patient regarding her allergies and sensitivities to antibiotics. Will treat with Macrobid. Patient declines anything for pain or nausea. Strict return precautions given. Patient and spouse verbalized understanding and agree with plan of care.  Paulette Blanch, MD 10/08/14 [phone]

## 2015-01-27 DIAGNOSIS — G43909 Migraine, unspecified, not intractable, without status migrainosus: Secondary | ICD-10-CM | POA: Insufficient documentation

## 2015-01-27 DIAGNOSIS — E039 Hypothyroidism, unspecified: Secondary | ICD-10-CM | POA: Diagnosis present

## 2015-01-27 DIAGNOSIS — I10 Essential (primary) hypertension: Secondary | ICD-10-CM | POA: Insufficient documentation

## 2015-01-27 DIAGNOSIS — G4730 Sleep apnea, unspecified: Secondary | ICD-10-CM | POA: Diagnosis present

## 2015-01-27 DIAGNOSIS — M8580 Other specified disorders of bone density and structure, unspecified site: Secondary | ICD-10-CM | POA: Insufficient documentation

## 2015-10-02 ENCOUNTER — Encounter: Payer: Self-pay | Admitting: *Deleted

## 2015-10-05 ENCOUNTER — Encounter
Admission: RE | Disposition: A | Discharge status: Home / Self Care | Payer: Self-pay | Source: Ambulatory Visit | Attending: Unknown Physician Specialty

## 2015-10-05 ENCOUNTER — Ambulatory Visit
Admission: RE | Admit: 2015-10-05 | Discharge: 2015-10-05 | Disposition: A | Discharge status: Home / Self Care | Payer: 59 | Source: Ambulatory Visit | Attending: Unknown Physician Specialty | Admitting: Unknown Physician Specialty

## 2015-10-05 ENCOUNTER — Ambulatory Visit: Payer: 59 | Admitting: Anesthesiology

## 2015-10-05 DIAGNOSIS — G4730 Sleep apnea, unspecified: Secondary | ICD-10-CM | POA: Insufficient documentation

## 2015-10-05 DIAGNOSIS — Z881 Allergy status to other antibiotic agents status: Secondary | ICD-10-CM | POA: Diagnosis not present

## 2015-10-05 DIAGNOSIS — Z880 Allergy status to penicillin: Secondary | ICD-10-CM | POA: Insufficient documentation

## 2015-10-05 DIAGNOSIS — Z1211 Encounter for screening for malignant neoplasm of colon: Secondary | ICD-10-CM | POA: Insufficient documentation

## 2015-10-05 DIAGNOSIS — E785 Hyperlipidemia, unspecified: Secondary | ICD-10-CM | POA: Diagnosis not present

## 2015-10-05 DIAGNOSIS — Z86010 Personal history of colonic polyps: Secondary | ICD-10-CM | POA: Diagnosis present

## 2015-10-05 DIAGNOSIS — Z9049 Acquired absence of other specified parts of digestive tract: Secondary | ICD-10-CM | POA: Insufficient documentation

## 2015-10-05 DIAGNOSIS — Z882 Allergy status to sulfonamides status: Secondary | ICD-10-CM | POA: Diagnosis not present

## 2015-10-05 DIAGNOSIS — M8580 Other specified disorders of bone density and structure, unspecified site: Secondary | ICD-10-CM | POA: Diagnosis not present

## 2015-10-05 DIAGNOSIS — Z91048 Other nonmedicinal substance allergy status: Secondary | ICD-10-CM | POA: Diagnosis not present

## 2015-10-05 DIAGNOSIS — Z6841 Body Mass Index (BMI) 40.0 and over, adult: Secondary | ICD-10-CM | POA: Insufficient documentation

## 2015-10-05 DIAGNOSIS — Z885 Allergy status to narcotic agent status: Secondary | ICD-10-CM | POA: Diagnosis not present

## 2015-10-05 DIAGNOSIS — Z91013 Allergy to seafood: Secondary | ICD-10-CM | POA: Insufficient documentation

## 2015-10-05 DIAGNOSIS — D510 Vitamin B12 deficiency anemia due to intrinsic factor deficiency: Secondary | ICD-10-CM | POA: Diagnosis not present

## 2015-10-05 DIAGNOSIS — E039 Hypothyroidism, unspecified: Secondary | ICD-10-CM | POA: Diagnosis not present

## 2015-10-05 DIAGNOSIS — I10 Essential (primary) hypertension: Secondary | ICD-10-CM | POA: Diagnosis not present

## 2015-10-05 DIAGNOSIS — K640 First degree hemorrhoids: Secondary | ICD-10-CM | POA: Insufficient documentation

## 2015-10-05 DIAGNOSIS — Z79899 Other long term (current) drug therapy: Secondary | ICD-10-CM | POA: Diagnosis not present

## 2015-10-05 DIAGNOSIS — Z91041 Radiographic dye allergy status: Secondary | ICD-10-CM | POA: Insufficient documentation

## 2015-10-05 DIAGNOSIS — Z90710 Acquired absence of both cervix and uterus: Secondary | ICD-10-CM | POA: Diagnosis not present

## 2015-10-05 DIAGNOSIS — Z8249 Family history of ischemic heart disease and other diseases of the circulatory system: Secondary | ICD-10-CM | POA: Diagnosis not present

## 2015-10-05 HISTORY — DX: Morbid (severe) obesity due to excess calories: E66.01

## 2015-10-05 HISTORY — DX: Gross hematuria: R31.0

## 2015-10-05 HISTORY — PX: COLONOSCOPY WITH PROPOFOL: SHX5780

## 2015-10-05 SURGERY — COLONOSCOPY WITH PROPOFOL
Anesthesia: General

## 2015-10-05 MED ORDER — SODIUM CHLORIDE 0.9 % IV SOLN
INTRAVENOUS | Status: DC
  Administered 2015-10-05: 1000 mL via INTRAVENOUS

## 2015-10-05 MED ORDER — PROPOFOL 10 MG/ML IV BOLUS
INTRAVENOUS | Status: DC | PRN
  Administered 2015-10-05 (×3): 30 mg via INTRAVENOUS

## 2015-10-05 MED ORDER — DEXAMETHASONE SODIUM PHOSPHATE 10 MG/ML IJ SOLN
INTRAMUSCULAR | Status: DC | PRN
Start: 2015-10-05 — End: 2015-10-05
  Administered 2015-10-05: 10 mg via INTRAVENOUS

## 2015-10-05 MED ORDER — SODIUM CHLORIDE 0.9 % IV SOLN: INTRAVENOUS | Status: DC

## 2015-10-05 MED ORDER — PROPOFOL 500 MG/50ML IV EMUL
INTRAVENOUS | Status: DC | PRN
  Administered 2015-10-05: 80 ug/kg/min via INTRAVENOUS

## 2015-10-05 MED ORDER — LIDOCAINE HCL (CARDIAC) 20 MG/ML IV SOLN
INTRAVENOUS | Status: DC | PRN
  Administered 2015-10-05: 80 mg via INTRAVENOUS

## 2015-10-05 NOTE — Anesthesia Preprocedure Evaluation (Signed)
Anesthesia Evaluation  Patient identified by MRN, date of birth, ID band Patient awake    Reviewed: Allergy & Precautions, H&P , NPO status , Patient's Chart, lab work & pertinent test results, reviewed documented beta blocker date and time   Airway Mallampati: III   Neck ROM: full    Dental  (+) Teeth Intact   Pulmonary neg pulmonary ROS, shortness of breath and with exertion, sleep apnea and Continuous Positive Airway Pressure Ventilation ,    Pulmonary exam normal        Cardiovascular hypertension, negative cardio ROS Normal cardiovascular exam Rhythm:regular Rate:Normal     Neuro/Psych  Headaches, negative neurological ROS  negative psych ROS   GI/Hepatic negative GI ROS, Neg liver ROS,   Endo/Other  negative endocrine ROSHypothyroidism   Renal/GU negative Renal ROS  negative genitourinary   Musculoskeletal   Abdominal   Peds  Hematology negative hematology ROS (+) anemia ,   Anesthesia Other Findings Past Medical History: No date: Allergic state No date: Allergic state No date: Anemia No date: Anemia No date: Gross hematuria No date: Headache(784.0) No date: Hyperlipidemia No date: Hyperlipidemia No date: Hypertension No date: Hypothyroidism No date: Hypothyroidism No date: Migraines No date: Morbid obesity ([place]) No date: Osteopenia No date: Osteopenia No date: Pernicious anemia No date: Sleep apnea No date: Sleep apnea Past Surgical History: No date: ABDOMINAL HYSTERECTOMY No date: CESAREAN SECTION No date: CHOLECYSTECTOMY No date: COLONOSCOPY No date: DILATION AND CURETTAGE OF UTERUS No date: FUNCTIONAL ENDOSCOPIC SINUS SURGERY BMI    Body Mass Index:  50.83 kg/m     Reproductive/Obstetrics                             Anesthesia Physical Anesthesia Plan  ASA: III  Anesthesia Plan: General   Post-op Pain Management:    Induction:   Airway Management  Planned:   Additional Equipment:   Intra-op Plan:   Post-operative Plan:   Informed Consent: I have reviewed the patients History and Physical, chart, labs and discussed the procedure including the risks, benefits and alternatives for the proposed anesthesia with the patient or authorized representative who has indicated his/her understanding and acceptance.   Dental Advisory Given  Plan Discussed with: CRNA  Anesthesia Plan Comments:         Anesthesia Quick Evaluation

## 2015-10-05 NOTE — H&P (Signed)
Primary Care Physician:  Juluis Pitch, MD Primary Gastroenterologist:  Dr. Vira Agar  Pre-Procedure History & Physical: HPI:  Tammy Turner is a 55 y.o. female is here for an colonoscopy.   Past Medical History:  Diagnosis Date  . Allergic state   . Allergic state   . Anemia   . Anemia   . Gross hematuria   . Headache(784.0)   . Hyperlipidemia   . Hyperlipidemia   . Hypertension   . Hypothyroidism   . Hypothyroidism   . Migraines   . Morbid obesity ([place])   . Osteopenia   . Osteopenia   . Pernicious anemia   . Sleep apnea   . Sleep apnea     Past Surgical History:  Procedure Laterality Date  . ABDOMINAL HYSTERECTOMY    . CESAREAN SECTION    . CHOLECYSTECTOMY    . COLONOSCOPY    . DILATION AND CURETTAGE OF UTERUS    . FUNCTIONAL ENDOSCOPIC SINUS SURGERY      Prior to Admission medications   Medication Sig Start Date End Date Taking? Authorizing Provider  B Complex Vitamins (VITAMIN B COMPLEX IJ) Inject as directed every 30 (thirty) days.   Yes Historical Provider, MD  Cholecalciferol (VITAMIN D3) 2000 UNITS capsule Take 4,000 Units by mouth daily.    Yes Historical Provider, MD  diltiazem (DILACOR XR) 240 MG 24 hr capsule Take 240 mg by mouth daily.   Yes Historical Provider, MD  levothyroxine (SYNTHROID, LEVOTHROID) 150 MCG tablet Take 150 mcg by mouth daily before breakfast.   Yes Historical Provider, MD  losartan-hydrochlorothiazide (HYZAAR) 100-25 MG per tablet Take 1 tablet by mouth daily. 10/03/13  Yes Wellington Hampshire, MD  nitrofurantoin, macrocrystal-monohydrate, (MACROBID) 100 MG capsule Take 1 capsule (100 mg total) by mouth 2 (two) times daily. 10/08/14  Yes Paulette Blanch, MD    Allergies as of 08/24/2015 - Review Complete 09/30/2014  Allergen Reaction Noted  . Niacin Anaphylaxis 09/13/2013  . Iodine Hives 09/13/2013  . Ivp dye [iodinated diagnostic agents]  09/13/2013  . Morphine  09/13/2013  . Other  09/13/2013  . Penicillins  09/13/2013  . Shrimp  [shellfish allergy]  09/13/2013  . Cephalexin Rash 09/13/2013  . Sulfa antibiotics Rash 09/13/2013    Family History  Problem Relation Age of Onset  . Pulmonary embolism Mother   . Hypertension Mother   . Hyperlipidemia Mother   . Hypertension Father     Social History   Social History  . Marital status: Married    Spouse name: N/A  . Number of children: N/A  . Years of education: N/A   Occupational History  . Not on file.   Social History Main Topics  . Smoking status: Never Smoker  . Smokeless tobacco: Not on file  . Alcohol use No  . Drug use: No  . Sexual activity: Not on file   Other Topics Concern  . Not on file   Social History Narrative  . No narrative on file    Review of Systems: See HPI, otherwise negative ROS  Physical Exam: BP (!) 168/76   Pulse 75   Temp 98.3 F (36.8 C) (Tympanic)   Resp 18   Ht 5\' 1"  (1.549 m)   Wt 122 kg (269 lb)   SpO2 99%   BMI 50.83 kg/m  General:   Alert,  pleasant and cooperative in NAD Head:  Normocephalic and atraumatic. Neck:  Supple; no masses or thyromegaly. Lungs:  Clear throughout to auscultation.  Heart:  Regular rate and rhythm. Abdomen:  Soft, nontender and nondistended. Normal bowel sounds, without guarding, and without rebound.   Neurologic:  Alert and  oriented x4;  grossly normal neurologically.  Impression/Plan: Anoosha Boeh is here for an colonoscopy to be performed for [facility] colon polyps  Risks, benefits, limitations, and alternatives regarding  colonoscopy have been reviewed with the patient.  Questions have been answered.  All parties agreeable.   Gaylyn Cheers, MD  10/05/2015, 2:25 PM

## 2015-10-05 NOTE — Anesthesia Postprocedure Evaluation (Signed)
Anesthesia Post Note  Patient: Tammy Turner  Procedure(s) Performed: Procedure(s) (LRB): COLONOSCOPY WITH PROPOFOL (N/A)  Patient location during evaluation: PACU Anesthesia Type: General Level of consciousness: awake and alert Pain management: pain level controlled Vital Signs Assessment: post-procedure vital signs reviewed and stable Respiratory status: spontaneous breathing, nonlabored ventilation and respiratory function stable Cardiovascular status: blood pressure returned to baseline and stable Postop Assessment: no signs of nausea or vomiting Anesthetic complications: no    Last Vitals:  Vitals:   10/05/15 1509 10/05/15 1517  BP: 112/86 130/73  Pulse: 76 67  Resp: 15 17  Temp: (!) 36 C     Last Pain:  Vitals:   10/05/15 1322  TempSrc: Tympanic                 Branna Cortina

## 2015-10-05 NOTE — Transfer of Care (Signed)
Immediate Anesthesia Transfer of Care Note  Patient: Tammy Turner  Procedure(s) Performed: Procedure(s): COLONOSCOPY WITH PROPOFOL (N/A)  Patient Location: PACU  Anesthesia Type:General  Level of Consciousness: awake, alert  and oriented  Airway & Oxygen Therapy: Patient Spontanous Breathing and Patient connected to nasal cannula oxygen  Post-op Assessment: Report given to RN and Post -op Vital signs reviewed and stable  Post vital signs: Reviewed and stable  Last Vitals:  Vitals:   10/05/15 1322 10/05/15 1509  BP: (!) 168/76 122/76  Pulse: 75 76  Resp: 18   Temp: 36.8 C (!) (P) 36 C    Last Pain:  Vitals:   10/05/15 1322  TempSrc: Tympanic         Complications: No apparent anesthesia complications

## 2015-10-05 NOTE — Op Note (Signed)
[facility] Patient Name: Tammy Turner Procedure Date: 10/05/2015 2:19 PM MRN: [id number] Account #: [id number] Date of Birth: [birth year] Admit Type: Outpatient Age: 55 Room: [facility] ENDO ROOM 1 Gender: Female Note Status: Finalized Procedure:            Colonoscopy Indications:          High risk colon cancer surveillance: Personal history                        of colonic polyps Providers:            Manya Silvas, MD Referring MD:         Youlanda Roys. Lovie Macadamia, MD (Referring MD) Medicines:            Propofol per Anesthesia Complications:        No immediate complications. Procedure:            Pre-Anesthesia Assessment:                       - After reviewing the risks and benefits, the patient                        was deemed in satisfactory condition to undergo the                        procedure.                       After obtaining informed consent, the colonoscope was                        passed under direct vision. Throughout the procedure,                        the patient's blood pressure, pulse, and oxygen                        saturations were monitored continuously. The                        Colonoscope was introduced through the anus and                        advanced to the the cecum, identified by appendiceal                        orifice and ileocecal valve. The colonoscopy was                        performed without difficulty. The patient tolerated the                        procedure well. The quality of the bowel preparation                        was good. Findings:      Internal hemorrhoids were found during endoscopy. The hemorrhoids were       small-medium-sized and Grade I (internal hemorrhoids that do not       prolapse).      The exam was otherwise without abnormality. Impression:           -  Internal hemorrhoids.                       - The examination was otherwise normal.                       - No  specimens collected. Recommendation:       - Repeat colonoscopy in 5 years for surveillance. Manya Silvas, MD 10/05/2015 3:04:17 PM This report has been signed electronically. Number of Addenda: 0 Note Initiated On: 10/05/2015 2:19 PM Scope Withdrawal Time: 0 hours 10 minutes 11 seconds  Total Procedure Duration: 0 hours 15 minutes 46 seconds       [facility]

## 2015-11-19 ENCOUNTER — Encounter: Payer: Self-pay | Admitting: Unknown Physician Specialty

## 2016-03-03 DIAGNOSIS — G4733 Obstructive sleep apnea (adult) (pediatric): Secondary | ICD-10-CM | POA: Diagnosis not present

## 2016-03-23 DIAGNOSIS — E538 Deficiency of other specified B group vitamins: Secondary | ICD-10-CM | POA: Diagnosis not present

## 2016-04-20 ENCOUNTER — Telehealth: Payer: Self-pay | Admitting: Obstetrics and Gynecology

## 2016-04-20 DIAGNOSIS — Z1239 Encounter for other screening for malignant neoplasm of breast: Secondary | ICD-10-CM

## 2016-04-20 NOTE — Telephone Encounter (Signed)
Pt is calling today needing an referral for [place] for mammography. Pt was seen in office in December. Please advise. [phone]

## 2016-04-21 NOTE — Telephone Encounter (Signed)
Pt aware.

## 2016-04-21 NOTE — Telephone Encounter (Signed)
Order put in [place]. RN to notify pt to sched at her convenience.

## 2016-04-29 DIAGNOSIS — G4730 Sleep apnea, unspecified: Secondary | ICD-10-CM | POA: Diagnosis not present

## 2016-04-29 DIAGNOSIS — R5382 Chronic fatigue, unspecified: Secondary | ICD-10-CM | POA: Diagnosis not present

## 2016-04-29 DIAGNOSIS — E538 Deficiency of other specified B group vitamins: Secondary | ICD-10-CM | POA: Diagnosis not present

## 2016-04-29 DIAGNOSIS — E039 Hypothyroidism, unspecified: Secondary | ICD-10-CM | POA: Diagnosis not present

## 2016-04-29 DIAGNOSIS — I10 Essential (primary) hypertension: Secondary | ICD-10-CM | POA: Diagnosis not present

## 2016-05-09 DIAGNOSIS — N390 Urinary tract infection, site not specified: Secondary | ICD-10-CM | POA: Diagnosis not present

## 2016-06-07 DIAGNOSIS — C50411 Malignant neoplasm of upper-outer quadrant of right female breast: Secondary | ICD-10-CM

## 2016-06-07 HISTORY — DX: Malignant neoplasm of upper-outer quadrant of right female breast: C50.411

## 2016-06-14 ENCOUNTER — Ambulatory Visit
Admission: RE | Admit: 2016-06-14 | Discharge: 2016-06-14 | Disposition: A | Discharge status: Home / Self Care | Payer: 59 | Source: Ambulatory Visit | Attending: Obstetrics and Gynecology | Admitting: Obstetrics and Gynecology

## 2016-06-14 DIAGNOSIS — Z1231 Encounter for screening mammogram for malignant neoplasm of breast: Secondary | ICD-10-CM | POA: Insufficient documentation

## 2016-06-15 ENCOUNTER — Telehealth: Payer: Self-pay

## 2016-06-15 ENCOUNTER — Other Ambulatory Visit: Payer: Self-pay | Admitting: Obstetrics and Gynecology

## 2016-06-15 DIAGNOSIS — R928 Other abnormal and inconclusive findings on diagnostic imaging of breast: Secondary | ICD-10-CM

## 2016-06-15 NOTE — Telephone Encounter (Signed)
Pt needs addl views, orders in computer. Pt hasn't been called by [facility] yet. Pt aware. Questions answered.

## 2016-06-15 NOTE — Telephone Encounter (Signed)
Patient calling for mammogram results from yesterday. Please advise. cb# [phone]. Per pt ok to leave voicemail.

## 2016-06-23 ENCOUNTER — Ambulatory Visit
Admission: RE | Admit: 2016-06-23 | Discharge: 2016-06-23 | Disposition: A | Discharge status: Home / Self Care | Payer: 59 | Source: Ambulatory Visit | Attending: Obstetrics and Gynecology | Admitting: Obstetrics and Gynecology

## 2016-06-23 ENCOUNTER — Telehealth: Payer: Self-pay | Admitting: Obstetrics and Gynecology

## 2016-06-23 DIAGNOSIS — N6311 Unspecified lump in the right breast, upper outer quadrant: Secondary | ICD-10-CM | POA: Insufficient documentation

## 2016-06-23 DIAGNOSIS — E538 Deficiency of other specified B group vitamins: Secondary | ICD-10-CM | POA: Diagnosis not present

## 2016-06-23 DIAGNOSIS — R928 Other abnormal and inconclusive findings on diagnostic imaging of breast: Secondary | ICD-10-CM | POA: Diagnosis present

## 2016-06-23 NOTE — Telephone Encounter (Signed)
Spoke with pt re: Birads 5 mammo. [facility] to sched bx. Will then send pt to gen surg regardless of results, per radiologist recommendation. Suggested [place] or [place]. Will f/u with pt once I get bx results.

## 2016-06-24 ENCOUNTER — Telehealth: Payer: Self-pay

## 2016-06-24 ENCOUNTER — Other Ambulatory Visit: Payer: Self-pay | Admitting: Obstetrics and Gynecology

## 2016-06-24 DIAGNOSIS — G4733 Obstructive sleep apnea (adult) (pediatric): Secondary | ICD-10-CM | POA: Diagnosis not present

## 2016-06-24 DIAGNOSIS — N6310 Unspecified lump in the right breast, unspecified quadrant: Secondary | ICD-10-CM

## 2016-06-24 DIAGNOSIS — R928 Other abnormal and inconclusive findings on diagnostic imaging of breast: Secondary | ICD-10-CM

## 2016-06-24 NOTE — Telephone Encounter (Signed)
Pt calling re abnl breast screening.  Radiologist rec surg regardless of bx results.  Wouldn't it be better to just have the surg vs two diff procedures if she has to do surg anyway?  She is concerned about having two procedures done.  Courtesy call to pt to let her now [facility] not in office today but may be checking msgs.  May not hear back from [facility] until Mon.  Adv pt that usually the first surg tells them how to proceed c second surg.  Pt has had two people tell her that surg makes the cancer spread.  Please call  [phone].

## 2016-06-27 ENCOUNTER — Encounter: Payer: Self-pay | Admitting: Obstetrics and Gynecology

## 2016-06-27 ENCOUNTER — Other Ambulatory Visit: Payer: Self-pay | Admitting: Obstetrics and Gynecology

## 2016-06-27 ENCOUNTER — Encounter: Payer: Self-pay | Admitting: General Surgery

## 2016-06-27 ENCOUNTER — Inpatient Hospital Stay: Payer: Self-pay

## 2016-06-27 ENCOUNTER — Ambulatory Visit (INDEPENDENT_AMBULATORY_CARE_PROVIDER_SITE_OTHER): Payer: 59 | Admitting: General Surgery

## 2016-06-27 VITALS — BP 150/76 | HR 79 | Resp 14 | Ht 61.0 in | Wt 270.0 lb

## 2016-06-27 DIAGNOSIS — C50411 Malignant neoplasm of upper-outer quadrant of right female breast: Secondary | ICD-10-CM | POA: Diagnosis not present

## 2016-06-27 DIAGNOSIS — N6311 Unspecified lump in the right breast, upper outer quadrant: Secondary | ICD-10-CM

## 2016-06-27 DIAGNOSIS — R140 Abdominal distension (gaseous): Secondary | ICD-10-CM

## 2016-06-27 DIAGNOSIS — R928 Other abnormal and inconclusive findings on diagnostic imaging of breast: Secondary | ICD-10-CM | POA: Insufficient documentation

## 2016-06-27 NOTE — Telephone Encounter (Signed)
Spoke with pt. She already had questions answered by Dr. Radford Pax. Will do ref to gen surg for after bx.

## 2016-06-27 NOTE — Progress Notes (Signed)
Pt aware of birads 5 mammo. [facility] to sched bx. Will refer to Dr. Bary Castilla for after bx for consultation.  Pt also with pelvic bloating/cramping. FH of ovar cancer but pt is My Risk neg 2016. Will check u/s.

## 2016-06-27 NOTE — Progress Notes (Signed)
Patient ID: Tammy Turner, female   DOB: 1960-03-30, 56 y.o.   MRN: [id number]  Chief Complaint  Patient presents with  . Other    mammogram    HPI Tammy Turner is a 56 y.o. female here for evaluation of findings on her mammogram. She gets regular mammograms done and does monthly breast checks. She had her mammogram done on 06/14/16 with added views and ultrasound of the right breast on 06/23/16. She has not felt anything with the breast prior to this. She denies any breast pain pain or nipple discharge. She does report that she has been feeling tired for the past 5 months.  HPI  Past Medical History:  Diagnosis Date  . Allergic state   . Anemia   . Family history of ovarian cancer    Pt is My Risk cancer genetic testing neg 2016  . Genetic testing 2016   My Risk negative  . Gross hematuria   . Headache(784.0)   . Hyperlipidemia   . Hypertension   . Hypothyroidism   . Migraines   . Morbid obesity ([place])   . Osteopenia   . Osteopenia   . Pernicious anemia   . Sleep apnea 2010   uses CPAP    Past Surgical History:  Procedure Laterality Date  . ABDOMINAL HYSTERECTOMY    . CESAREAN SECTION     x 2  . CHOLECYSTECTOMY    . COLONOSCOPY    . COLONOSCOPY WITH PROPOFOL N/A 10/05/2015   Procedure: COLONOSCOPY WITH PROPOFOL;  Surgeon: Manya Silvas, MD;  Location: [facility] ENDOSCOPY;  Service: Endoscopy;  Laterality: N/A;  . DILATION AND CURETTAGE OF UTERUS    . FUNCTIONAL ENDOSCOPIC SINUS SURGERY      Family History  Problem Relation Age of Onset  . Pulmonary embolism Mother   . Hypertension Mother   . Hyperlipidemia Mother   . Hypertension Father   . Ovarian cancer Maternal Grandmother        60  . Colon cancer Paternal Grandmother        24  . Breast cancer Neg Hx     Social History Social History  Substance Use Topics  . Smoking status: Never Smoker  . Smokeless tobacco: Never Used  . Alcohol use No    Allergies  Allergen Reactions  . Niacin Anaphylaxis  .  Iodine Hives  . Ivp Dye [Iodinated Diagnostic Agents]   . Levaquin [Levofloxacin In D5w] Other (See Comments)    Tendonitis   . Morphine     Other reaction(s): Unknown  . Other     Other reaction(s): Unknown Contrast dye  . Penicillins     Other reaction(s): Unknown  . Shrimp [Shellfish Allergy]   . Cephalexin Rash  . Sulfa Antibiotics Rash    Current Outpatient Prescriptions  Medication Sig Dispense Refill  . B Complex Vitamins (VITAMIN B COMPLEX IJ) Inject as directed every 30 (thirty) days.    . Cholecalciferol (VITAMIN D3) 2000 UNITS capsule Take 4,000 Units by mouth daily.     Marland Kitchen levothyroxine (SYNTHROID, LEVOTHROID) 150 MCG tablet Take 150 mcg by mouth daily before breakfast.    . losartan-hydrochlorothiazide (HYZAAR) 100-25 MG per tablet Take 1 tablet by mouth daily. 90 tablet 3   No current facility-administered medications for this visit.     Review of Systems Review of Systems  Constitutional: Positive for fatigue. Negative for activity change, appetite change, chills, diaphoresis, fever and unexpected weight change.  Respiratory: Negative.   Cardiovascular: Negative.  Blood pressure (!) 150/76, pulse 79, resp. rate 14, height 5\' 1"  (1.549 m), weight 270 lb (122.5 kg).  Physical Exam Physical Exam  Constitutional: She is oriented to person, place, and time. She appears well-developed and well-nourished.  Eyes: Conjunctivae are normal. No scleral icterus.  Neck: Neck supple.  Cardiovascular: Normal rate, regular rhythm and normal heart sounds.   Pulmonary/Chest: Effort normal and breath sounds normal. Right breast exhibits no inverted nipple, no mass, no nipple discharge, no skin change and no tenderness. Left breast exhibits no inverted nipple, no mass, no nipple discharge, no skin change and no tenderness.    Lymphadenopathy:    She has no cervical adenopathy.       Right: No supraclavicular adenopathy present.       Left: No supraclavicular adenopathy  present.  Neurological: She is alert and oriented to person, place, and time.  Skin: Skin is warm and dry.  Psychiatric: She has a normal mood and affect.    Data Reviewed 03/10/2014 bilateral mammograms as well as 06/14/2016 screening mammograms and 06/23/2016 diagnostic and right ultrasound imaging studies reviewed. New density has developed in the upper outer quadrant of the right breast. BI-RADS-5.  The patient was interested in proceeding with early biopsy of the new mammographic abnormality.  Ultrasound examination of the right breast showed irregular density at the 10:00 location, 5 cm (slightly more medial than noted above) measuring 0.28 x 0.43 x 0.56 cm. This had dense acoustic shadowing. It was not particularly taller than it was wide. A reaming tissue is noted around it.  10 mL of 0.5% Xylocaine with 0.25% Marcaine with 1-200,000 epinephrine was instilled well tolerated.  Making use of a 14-gauge Finesse biopsy device a proximally 8 core samples were obtained from a lateral-medial position. Approximately 2 mL of blood loss was noted. A postbiopsy clip was placed. At least 50% of the lesion was removed. Skin defect was closed with benzoin followed by Steri-Strips, Telfa and Tegaderm dressing.  Postbiopsy instructions reviewed.  Assessment    New right breast mass, biopsied.    Plan    The patient tolerated the biopsy well. She will be contacted by phone tomorrow with pathology results.    HPI, Physical Exam, Assessment and Plan have been scribed under the direction and in the presence of Robert Bellow, MD  Concepcion Living, LPN  I have completed the exam and reviewed the above documentation for accuracy and completeness.  I agree with the above.  Haematologist has been used and any errors in dictation or transcription are unintentional.  Hervey Ard, M.D., F.A.C.S.   Robert Bellow 06/27/2016, 8:37 PM

## 2016-06-27 NOTE — Patient Instructions (Addendum)
CARE AFTER BREAST BIOPSY  1. Leave the dressing on that your doctor applied after the biopsy. It is waterproof. You may bathe, shower and/or swim. The dressing can be removed in 3 days, you will see small strips of tape against your skin on the incision. Do not remove these strips they will gradually fall off in about 2-3 weeks. You may use an ice pack on and off for the first 12-24 hours for comfort.  2. You may want to use a gauze,cloth or similar protection in your bra to prevent rubbing against your dressing and incision. This is not necessary, but you may feel more comfortable doing so.  3. It is recommended that you wear a bra day and night to give support to the breast. This will prevent the weight of the breast from pulling on the incision.  4. Your breast may feel hard and lumpy under the incision. Do not be alarmed. This is the underlying stitching of tissue. Softening of this tissue will occur in time.  5. You may have a follow up appointment or phone follow up in one week after your biopsy. The office phone number is [phone].  6. You will notice about a week or two after your office visit that the strips of the tape on your incision will begin to loosen. These may then be removed.  7. Report to your doctor any of the following:  * Severe pain not relieved by your pain medication  *Redness of the incision  * Drainage from the incision  *Fever greater than 101 degrees  We will call you with the results.

## 2016-06-28 ENCOUNTER — Telehealth: Payer: Self-pay | Admitting: General Surgery

## 2016-06-28 NOTE — Telephone Encounter (Signed)
The patient was notified that yesterday's biopsy showed a grade 1 invasive ductal carcinoma. She reports tolerating the biopsy well.  We briefly discussed options for management including mastectomy and breast conservation is being equivalent procedures. With her very large breasts and very tiny tumor I think she would be best served by lumpectomy.  Opportunity for second surgical opinion was reviewed.  The patient has a scheduled business trip to [place], [place] in the first half of June. We'll try to have an office visit prior to that and then plan for surgery afterwards. She was encouraged to call the office if she has any questions between now and her preop visit.

## 2016-06-28 NOTE — Telephone Encounter (Signed)
Spoke with pt. She is doing ok, just in shock.

## 2016-06-29 ENCOUNTER — Other Ambulatory Visit: Payer: Self-pay | Admitting: *Deleted

## 2016-06-29 ENCOUNTER — Telehealth: Payer: Self-pay

## 2016-06-29 ENCOUNTER — Ambulatory Visit: Payer: 59

## 2016-06-29 ENCOUNTER — Ambulatory Visit: Payer: 59 | Admitting: General Surgery

## 2016-06-29 DIAGNOSIS — C50911 Malignant neoplasm of unspecified site of right female breast: Secondary | ICD-10-CM

## 2016-06-29 NOTE — Telephone Encounter (Signed)
Patient would like to have her surgery at [facility] on 07/25/16. She will be seen here for a pre op and discussion with Dr Bary Castilla on 07/12/16 at 8:30 am.

## 2016-06-29 NOTE — Telephone Encounter (Signed)
-----   Message from Robert Bellow, MD sent at 06/28/2016  4:15 PM EDT ----- The patient has been notified of her biopsy results.   Please give her a call on her work number to find a surgical date after her upcoming trip to [place]. I would like to see her in the office prior to her trip to review her plan of care.

## 2016-07-01 ENCOUNTER — Encounter: Payer: Self-pay | Admitting: General Surgery

## 2016-07-05 ENCOUNTER — Ambulatory Visit (INDEPENDENT_AMBULATORY_CARE_PROVIDER_SITE_OTHER): Payer: 59

## 2016-07-05 DIAGNOSIS — R140 Abdominal distension (gaseous): Secondary | ICD-10-CM | POA: Diagnosis not present

## 2016-07-06 ENCOUNTER — Telehealth: Payer: Self-pay

## 2016-07-06 ENCOUNTER — Encounter: Payer: Self-pay | Admitting: Obstetrics and Gynecology

## 2016-07-06 NOTE — Telephone Encounter (Signed)
Pt calling for results of yesterday's u/s of ovaries.  Pt wasn't able to sleep last night worried about results and recent dx of breast cancer.  Pt would like to have results today.  [facility] told her last week she would let her know.  [phone].

## 2016-07-06 NOTE — Telephone Encounter (Signed)
Sent pt email through [place] since I was out of town traveling. Will call pt early tomorrow AM as well.

## 2016-07-07 ENCOUNTER — Encounter: Payer: Self-pay | Admitting: Obstetrics and Gynecology

## 2016-07-07 NOTE — Progress Notes (Signed)
Review of ULTRASOUND.    I have personally reviewed images and report of recent ultrasound done at [facility].    Plan of management to be discussed with patient.  

## 2016-07-07 NOTE — Telephone Encounter (Signed)
Pt aware of neg u/s. Discussed etiology of pelvic pain and to f/u with PCP if sx persist.

## 2016-07-12 ENCOUNTER — Ambulatory Visit (INDEPENDENT_AMBULATORY_CARE_PROVIDER_SITE_OTHER): Payer: 59 | Admitting: General Surgery

## 2016-07-12 ENCOUNTER — Encounter: Payer: Self-pay | Admitting: General Surgery

## 2016-07-12 VITALS — BP 130/74 | HR 74 | Resp 12 | Ht 60.0 in | Wt 264.0 lb

## 2016-07-12 DIAGNOSIS — Z170 Estrogen receptor positive status [ER+]: Secondary | ICD-10-CM | POA: Diagnosis not present

## 2016-07-12 DIAGNOSIS — C50411 Malignant neoplasm of upper-outer quadrant of right female breast: Secondary | ICD-10-CM

## 2016-07-12 NOTE — Progress Notes (Signed)
Patient ID: Tammy Turner, female   DOB: Jul 08, 1960, 56 y.o.   MRN: [id number]  Chief Complaint  Patient presents with  . Pre-op Exam    HPI Tammy Turner is a 56 y.o. female here today for her pre op right partial mastectomy with axillarysentinel lymph node biopsy scheduled on 07/25/2016. Husband, Nicki Reaper is present at visit.  HPI  Past Medical History:  Diagnosis Date  . Allergic state   . Anemia   . Breast cancer of upper-outer quadrant of right female breast ([place]) 06/2016   ER 95%, PR 90%, HER-2/neu not overexpressed.  . Family history of ovarian cancer    Pt is My Risk cancer genetic testing neg 2016  . Genetic testing 2016   My Risk negative  . Gross hematuria   . Headache(784.0)   . Hyperlipidemia   . Hypertension   . Hypothyroidism   . Migraines   . Morbid obesity ([place])   . Osteopenia   . Osteopenia   . Pernicious anemia   . Sleep apnea 2010   uses CPAP    Past Surgical History:  Procedure Laterality Date  . ABDOMINAL HYSTERECTOMY  2011   [place]; Dr. Laurey Morale, due to leio/adenomyosis  . CESAREAN SECTION     x 2  . CHOLECYSTECTOMY    . COLONOSCOPY    . COLONOSCOPY WITH PROPOFOL N/A 10/05/2015   Procedure: COLONOSCOPY WITH PROPOFOL;  Surgeon: Manya Silvas, MD;  Location: [name] ENDOSCOPY;  Service: Endoscopy;  Laterality: N/A;  . DILATION AND CURETTAGE OF UTERUS    . FUNCTIONAL ENDOSCOPIC SINUS SURGERY      Family History  Problem Relation Age of Onset  . Pulmonary embolism Mother   . Hypertension Mother   . Hyperlipidemia Mother   . Hypertension Father   . Ovarian cancer Maternal Grandmother        50  . Colon cancer Paternal Grandmother        76  . Breast cancer Neg Hx     Social History Social History  Substance Use Topics  . Smoking status: Never Smoker  . Smokeless tobacco: Never Used  . Alcohol use No    Allergies  Allergen Reactions  . Niacin Anaphylaxis  . Iodine Hives  . Ivp Dye [Iodinated Diagnostic Agents]   . Levaquin [Levofloxacin  In D5w] Other (See Comments)    Tendonitis   . Morphine     Other reaction(s): Unknown  . Other     Other reaction(s): Unknown Contrast dye  . Penicillins     Other reaction(s): Unknown  . Shrimp [Shellfish Allergy]   . Cephalexin Rash  . Sulfa Antibiotics Rash    Current Outpatient Prescriptions  Medication Sig Dispense Refill  . Ascorbic Acid (VITAMIN C) 100 MG tablet Take 100 mg by mouth daily.    . B Complex Vitamins (VITAMIN B COMPLEX IJ) Inject as directed every 30 (thirty) days.    . Cholecalciferol (VITAMIN D3) 2000 UNITS capsule Take 4,000 Units by mouth daily.     . Flaxseed, Linseed, (FLAX SEEDS PO) Take by mouth.    . levothyroxine (SYNTHROID, LEVOTHROID) 175 MCG tablet TAKE 1 TABLET BY MOUTH ONCE DAILY ON EMPTY STOMACH WITH GLASS OF WATER 30-60 MIN BEFORE BREAKFAST  1  . losartan-hydrochlorothiazide (HYZAAR) 100-25 MG per tablet Take 1 tablet by mouth daily. 90 tablet 3   No current facility-administered medications for this visit.     Review of Systems Review of Systems  Constitutional: Negative.   Respiratory: Negative.  Cardiovascular: Negative.     Blood pressure 130/74, pulse 74, resp. rate 12, height 5' (1.524 m), weight 264 lb (119.7 kg).  Physical Exam Physical Exam  Constitutional: She is oriented to person, place, and time. She appears well-developed and well-nourished.  Cardiovascular: Normal rate, regular rhythm and normal heart sounds.   Pulmonary/Chest: Effort normal and breath sounds normal.  Neurological: She is alert and oriented to person, place, and time.  Skin: Skin is warm and dry.    Data Reviewed Pathology revealed a 5 mm ER/PR positive, HER-2/neu not overexpressed invasive ductal carcinoma.  Assessment    Stage I carcinoma the right breast.    Plan    The majority of the visit was spent reviewing the options for breast cancer treatment. Breast conservation with lumpectomy and radiation therapy  was presented as equivalent to  mastectomy for long-term control. The pros and cons of each treatment regimen were reviewed.    At this time, there is no indication to chemotherapy will be of benefit based on tumor size and receptor status.  The patient desires to proceed with breast conservation. The role for sentinel node biopsy was discussed.  The patient is going to [place] for the next week as part of business. Surgeries presently scheduled for 07/25/2016.  HPI, Physical Exam, Assessment and Plan have been scribed under the direction and in the presence of Hervey Ard, MD.   Gaspar Cola, CMA  I have completed the exam and reviewed the above documentation for accuracy and completeness.  I agree with the above.  Haematologist has been used and any errors in dictation or transcription are unintentional.  Hervey Ard, M.D., F.A.C.S. Robert Bellow 07/12/2016, 9:52 AM

## 2016-07-15 DIAGNOSIS — M1712 Unilateral primary osteoarthritis, left knee: Secondary | ICD-10-CM | POA: Diagnosis not present

## 2016-07-15 DIAGNOSIS — M25562 Pain in left knee: Secondary | ICD-10-CM | POA: Diagnosis not present

## 2016-07-18 ENCOUNTER — Encounter: Payer: Self-pay | Admitting: *Deleted

## 2016-07-18 ENCOUNTER — Encounter
Admission: RE | Admit: 2016-07-18 | Discharge: 2016-07-18 | Disposition: A | Discharge status: Home / Self Care | Payer: 59 | Source: Ambulatory Visit | Attending: General Surgery | Admitting: General Surgery

## 2016-07-18 NOTE — Patient Instructions (Signed)
  Your procedure is scheduled on: 07/25/16 Report to [place] AM    Remember: Instructions that are not followed completely may result in serious medical risk, up to and including death, or upon the discretion of your surgeon and anesthesiologist your surgery may need to be rescheduled.    _x___ 1. Do not eat food or drink liquids after midnight. No gum chewing or                              hard candies.     __x__ 2. No Alcohol for 24 hours before or after surgery.   __x__3. No Smoking for 24 prior to surgery.   ____  4. Bring all medications with you on the day of surgery if instructed.    __x__ 5. Notify your doctor if there is any change in your medical condition     (cold, fever, infections).     Do not wear jewelry, make-up, hairpins, clips or nail polish.  Do not wear lotions, powders, or perfumes. You may wear deodorant.  Do not shave 48 hours prior to surgery. Men may shave face and neck.  Do not bring valuables to the hospital.    [facility] is not responsible for any belongings or valuables.               Contacts, dentures or bridgework may not be worn into surgery.  Leave your suitcase in the car. After surgery it may be brought to your room.  For patients admitted to the hospital, discharge time is determined by your                       treatment team.   Patients discharged the day of surgery will not be allowed to drive home.  You will need someone to drive you home and stay with you the night of your procedure.    Please read over the following fact sheets that you were given:   [facility] Preparing for Surgery and or MRSA Information   _x___ Take anti-hypertensive (unless it includes a diuretic), cardiac, seizure, asthma,     anti-reflux and psychiatric medicines. These include:  1. LEVOTHYROXINE  2.  3.  4.  5.  6.  ____Fleets enema or Magnesium Citrate as directed.   _x___ Use CHG Soap or sage wipes as directed on instruction sheet   ____  Use inhalers on the day of surgery and bring to hospital day of surgery  ____ Stop Metformin and Janumet 2 days prior to surgery.    ____ Take 1/2 of usual insulin dose the night before surgery and none on the morning     surgery.   ____ Follow recommendations from Cardiologist, Pulmonologist or PCP regarding          stopping Aspirin, Coumadin, Pllavix ,Eliquis, Effient, or Pradaxa, and Pletal.  X____Stop Anti-inflammatories such as Advil, Aleve, Ibuprofen, Motrin, Naproxen, Naprosyn, Goodies powders or aspirin products. OK to take Tylenol and  Celebrex.   _x___ Stop supplements until after surgery.  But may continue Vitamin D, Vitamin B,  and multivitamin.  HAS ALREADY STOPPED FLAX SUPPLEMENTS. OTHER SUPPLEMENTS OK TO TAKE UNTIL SURGERY  __X__ Bring C-Pap to the hospital.

## 2016-07-22 ENCOUNTER — Encounter
Admission: RE | Admit: 2016-07-22 | Discharge: 2016-07-22 | Disposition: A | Discharge status: Home / Self Care | Payer: 59 | Source: Ambulatory Visit | Attending: General Surgery | Admitting: General Surgery

## 2016-07-22 DIAGNOSIS — I10 Essential (primary) hypertension: Secondary | ICD-10-CM | POA: Diagnosis not present

## 2016-07-22 DIAGNOSIS — E039 Hypothyroidism, unspecified: Secondary | ICD-10-CM | POA: Diagnosis not present

## 2016-07-22 DIAGNOSIS — Z8041 Family history of malignant neoplasm of ovary: Secondary | ICD-10-CM | POA: Diagnosis not present

## 2016-07-22 DIAGNOSIS — Z79899 Other long term (current) drug therapy: Secondary | ICD-10-CM | POA: Diagnosis not present

## 2016-07-22 DIAGNOSIS — G4730 Sleep apnea, unspecified: Secondary | ICD-10-CM | POA: Diagnosis not present

## 2016-07-22 DIAGNOSIS — D510 Vitamin B12 deficiency anemia due to intrinsic factor deficiency: Secondary | ICD-10-CM | POA: Diagnosis not present

## 2016-07-22 DIAGNOSIS — C50411 Malignant neoplasm of upper-outer quadrant of right female breast: Secondary | ICD-10-CM | POA: Diagnosis not present

## 2016-07-22 DIAGNOSIS — Z7951 Long term (current) use of inhaled steroids: Secondary | ICD-10-CM | POA: Diagnosis not present

## 2016-07-22 DIAGNOSIS — Z6841 Body Mass Index (BMI) 40.0 and over, adult: Secondary | ICD-10-CM | POA: Diagnosis not present

## 2016-07-22 DIAGNOSIS — Z170 Estrogen receptor positive status [ER+]: Secondary | ICD-10-CM | POA: Diagnosis not present

## 2016-07-22 LAB — POTASSIUM: Potassium: 3.3 mmol/L — ABNORMAL LOW (ref 3.5–5.1)

## 2016-07-25 ENCOUNTER — Encounter
Admission: RE | Admit: 2016-07-25 | Discharge: 2016-07-25 | Disposition: A | Discharge status: Home / Self Care | Payer: 59 | Source: Ambulatory Visit | Attending: General Surgery | Admitting: General Surgery

## 2016-07-25 ENCOUNTER — Ambulatory Visit: Payer: 59

## 2016-07-25 ENCOUNTER — Encounter
Admission: RE | Disposition: A | Discharge status: Home / Self Care | Payer: Self-pay | Source: Ambulatory Visit | Attending: General Surgery

## 2016-07-25 ENCOUNTER — Ambulatory Visit: Payer: 59 | Admitting: Certified Registered Nurse Anesthetist

## 2016-07-25 ENCOUNTER — Ambulatory Visit
Admission: RE | Admit: 2016-07-25 | Discharge: 2016-07-25 | Disposition: A | Discharge status: Home / Self Care | Payer: 59 | Source: Ambulatory Visit | Attending: General Surgery | Admitting: General Surgery

## 2016-07-25 ENCOUNTER — Encounter: Payer: Self-pay | Admitting: *Deleted

## 2016-07-25 DIAGNOSIS — I10 Essential (primary) hypertension: Secondary | ICD-10-CM | POA: Insufficient documentation

## 2016-07-25 DIAGNOSIS — N6310 Unspecified lump in the right breast, unspecified quadrant: Secondary | ICD-10-CM | POA: Diagnosis not present

## 2016-07-25 DIAGNOSIS — Z79899 Other long term (current) drug therapy: Secondary | ICD-10-CM | POA: Insufficient documentation

## 2016-07-25 DIAGNOSIS — D510 Vitamin B12 deficiency anemia due to intrinsic factor deficiency: Secondary | ICD-10-CM | POA: Insufficient documentation

## 2016-07-25 DIAGNOSIS — C50911 Malignant neoplasm of unspecified site of right female breast: Secondary | ICD-10-CM | POA: Diagnosis not present

## 2016-07-25 DIAGNOSIS — Z8041 Family history of malignant neoplasm of ovary: Secondary | ICD-10-CM | POA: Insufficient documentation

## 2016-07-25 DIAGNOSIS — G4730 Sleep apnea, unspecified: Secondary | ICD-10-CM | POA: Diagnosis not present

## 2016-07-25 DIAGNOSIS — Z6841 Body Mass Index (BMI) 40.0 and over, adult: Secondary | ICD-10-CM | POA: Insufficient documentation

## 2016-07-25 DIAGNOSIS — E039 Hypothyroidism, unspecified: Secondary | ICD-10-CM | POA: Insufficient documentation

## 2016-07-25 DIAGNOSIS — C50411 Malignant neoplasm of upper-outer quadrant of right female breast: Secondary | ICD-10-CM | POA: Diagnosis not present

## 2016-07-25 DIAGNOSIS — Z7951 Long term (current) use of inhaled steroids: Secondary | ICD-10-CM | POA: Insufficient documentation

## 2016-07-25 DIAGNOSIS — R928 Other abnormal and inconclusive findings on diagnostic imaging of breast: Secondary | ICD-10-CM

## 2016-07-25 DIAGNOSIS — Z170 Estrogen receptor positive status [ER+]: Secondary | ICD-10-CM | POA: Insufficient documentation

## 2016-07-25 HISTORY — PX: PARTIAL MASTECTOMY WITH AXILLARY SENTINEL LYMPH NODE BIOPSY: SHX6004

## 2016-07-25 HISTORY — PX: BREAST EXCISIONAL BIOPSY: SUR124

## 2016-07-25 LAB — POCT I-STAT 4, (NA,K, GLUC, HGB,HCT)
Glucose, Bld: 95 mg/dL (ref 65–99)
HCT: 39 % (ref 36.0–46.0)
Hemoglobin: 13.3 g/dL (ref 12.0–15.0)
POTASSIUM: 4 mmol/L (ref 3.5–5.1)
SODIUM: 141 mmol/L (ref 135–145)

## 2016-07-25 SURGERY — PARTIAL MASTECTOMY WITH AXILLARY SENTINEL LYMPH NODE BIOPSY
Anesthesia: General | Laterality: Right | Wound class: Clean

## 2016-07-25 MED ORDER — OXYCODONE HCL 5 MG/5ML PO SOLN: 5.0000 mg | Freq: Once | ORAL | Status: DC | PRN

## 2016-07-25 MED ORDER — FENTANYL CITRATE (PF) 100 MCG/2ML IJ SOLN
INTRAMUSCULAR | Status: AC
  Filled 2016-07-25: qty 2

## 2016-07-25 MED ORDER — METHYLENE BLUE 0.5 % INJ SOLN
INTRAVENOUS | Status: AC
  Filled 2016-07-25: qty 10

## 2016-07-25 MED ORDER — SUCCINYLCHOLINE CHLORIDE 20 MG/ML IJ SOLN
INTRAMUSCULAR | Status: DC | PRN
  Administered 2016-07-25: 100 mg via INTRAVENOUS

## 2016-07-25 MED ORDER — ONDANSETRON HCL 4 MG/2ML IJ SOLN
INTRAMUSCULAR | Status: DC | PRN
  Administered 2016-07-25: 4 mg via INTRAVENOUS

## 2016-07-25 MED ORDER — LIDOCAINE HCL (PF) 2 % IJ SOLN
INTRAMUSCULAR | Status: AC
  Filled 2016-07-25: qty 2

## 2016-07-25 MED ORDER — TECHNETIUM TC 99M SULFUR COLLOID FILTERED
1.0000 | Freq: Once | INTRAVENOUS | Status: AC | PRN
  Administered 2016-07-25: 0.862 via INTRADERMAL

## 2016-07-25 MED ORDER — DEXAMETHASONE SODIUM PHOSPHATE 10 MG/ML IJ SOLN
INTRAMUSCULAR | Status: DC | PRN
  Administered 2016-07-25: 5 mg via INTRAVENOUS

## 2016-07-25 MED ORDER — HYDROCODONE-ACETAMINOPHEN 5-325 MG PO TABS: 1.0000 | ORAL_TABLET | ORAL | 0 refills | Status: DC | PRN

## 2016-07-25 MED ORDER — PROMETHAZINE HCL 25 MG/ML IJ SOLN: 6.2500 mg | INTRAMUSCULAR | Status: DC | PRN

## 2016-07-25 MED ORDER — KETOROLAC TROMETHAMINE 30 MG/ML IJ SOLN
INTRAMUSCULAR | Status: AC
  Filled 2016-07-25: qty 1

## 2016-07-25 MED ORDER — MEPERIDINE HCL 50 MG/ML IJ SOLN: 6.2500 mg | INTRAMUSCULAR | Status: DC | PRN

## 2016-07-25 MED ORDER — ACETAMINOPHEN 10 MG/ML IV SOLN
INTRAVENOUS | Status: DC | PRN
  Administered 2016-07-25: 1000 mg via INTRAVENOUS

## 2016-07-25 MED ORDER — ROCURONIUM BROMIDE 50 MG/5ML IV SOLN
INTRAVENOUS | Status: AC
  Filled 2016-07-25: qty 1

## 2016-07-25 MED ORDER — ACETAMINOPHEN 10 MG/ML IV SOLN
INTRAVENOUS | Status: AC
  Filled 2016-07-25: qty 200

## 2016-07-25 MED ORDER — HYDROCODONE-ACETAMINOPHEN 5-325 MG PO TABS
ORAL_TABLET | ORAL | Status: AC
  Administered 2016-07-25: 1
  Filled 2016-07-25: qty 1

## 2016-07-25 MED ORDER — ONDANSETRON HCL 4 MG/2ML IJ SOLN
INTRAMUSCULAR | Status: AC
  Filled 2016-07-25: qty 2

## 2016-07-25 MED ORDER — FENTANYL CITRATE (PF) 100 MCG/2ML IJ SOLN
25.0000 ug | INTRAMUSCULAR | Status: DC | PRN
  Administered 2016-07-25 (×2): 25 ug via INTRAVENOUS

## 2016-07-25 MED ORDER — GLYCOPYRROLATE 0.2 MG/ML IJ SOLN
INTRAMUSCULAR | Status: DC | PRN
  Administered 2016-07-25: 0.2 mg via INTRAVENOUS

## 2016-07-25 MED ORDER — OXYCODONE HCL 5 MG PO TABS: 5.0000 mg | ORAL_TABLET | Freq: Once | ORAL | Status: DC | PRN

## 2016-07-25 MED ORDER — FAMOTIDINE 20 MG PO TABS
20.0000 mg | ORAL_TABLET | Freq: Once | ORAL | Status: AC
  Administered 2016-07-25: 20 mg via ORAL

## 2016-07-25 MED ORDER — LIDOCAINE HCL (CARDIAC) 20 MG/ML IV SOLN
INTRAVENOUS | Status: DC | PRN
  Administered 2016-07-25: 80 mg via INTRAVENOUS

## 2016-07-25 MED ORDER — PROPOFOL 10 MG/ML IV BOLUS
INTRAVENOUS | Status: AC
  Filled 2016-07-25: qty 20

## 2016-07-25 MED ORDER — PROPOFOL 10 MG/ML IV BOLUS
INTRAVENOUS | Status: DC | PRN
  Administered 2016-07-25: 200 mg via INTRAVENOUS
  Administered 2016-07-25 (×2): 50 mg via INTRAVENOUS

## 2016-07-25 MED ORDER — BUPIVACAINE-EPINEPHRINE (PF) 0.5% -1:200000 IJ SOLN
INTRAMUSCULAR | Status: DC | PRN
  Administered 2016-07-25: 30 mL

## 2016-07-25 MED ORDER — FENTANYL CITRATE (PF) 100 MCG/2ML IJ SOLN
INTRAMUSCULAR | Status: DC | PRN
  Administered 2016-07-25 (×4): 50 ug via INTRAVENOUS

## 2016-07-25 MED ORDER — LACTATED RINGERS IV SOLN
INTRAVENOUS | Status: DC
  Administered 2016-07-25: 10:00:00 via INTRAVENOUS

## 2016-07-25 MED ORDER — MIDAZOLAM HCL 2 MG/2ML IJ SOLN
INTRAMUSCULAR | Status: DC | PRN
  Administered 2016-07-25: 2 mg via INTRAVENOUS

## 2016-07-25 MED ORDER — DEXAMETHASONE SODIUM PHOSPHATE 10 MG/ML IJ SOLN
INTRAMUSCULAR | Status: AC
  Filled 2016-07-25: qty 1

## 2016-07-25 MED ORDER — SODIUM CHLORIDE FLUSH 0.9 % IV SOLN
INTRAVENOUS | Status: AC
  Filled 2016-07-25: qty 10

## 2016-07-25 MED ORDER — BUPIVACAINE-EPINEPHRINE (PF) 0.5% -1:200000 IJ SOLN
INTRAMUSCULAR | Status: AC
  Filled 2016-07-25: qty 30

## 2016-07-25 MED ORDER — KETOROLAC TROMETHAMINE 30 MG/ML IJ SOLN
INTRAMUSCULAR | Status: DC | PRN
  Administered 2016-07-25: 30 mg via INTRAVENOUS

## 2016-07-25 MED ORDER — FAMOTIDINE 20 MG PO TABS
ORAL_TABLET | ORAL | Status: AC
  Filled 2016-07-25: qty 1

## 2016-07-25 MED ORDER — GLYCOPYRROLATE 0.2 MG/ML IJ SOLN
INTRAMUSCULAR | Status: AC
  Filled 2016-07-25: qty 1

## 2016-07-25 MED ORDER — SUCCINYLCHOLINE CHLORIDE 20 MG/ML IJ SOLN
INTRAMUSCULAR | Status: AC
  Filled 2016-07-25: qty 1

## 2016-07-25 MED ORDER — METHYLENE BLUE 0.5 % INJ SOLN
INTRAVENOUS | Status: DC | PRN
  Administered 2016-07-25: 5 mL via SUBMUCOSAL

## 2016-07-25 MED ORDER — MIDAZOLAM HCL 2 MG/2ML IJ SOLN
INTRAMUSCULAR | Status: AC
  Filled 2016-07-25: qty 2

## 2016-07-25 SURGICAL SUPPLY — 53 items
BANDAGE ELASTIC 6 LF NS (GAUZE/BANDAGES/DRESSINGS) IMPLANT
BLADE SURG 15 STRL SS SAFETY (BLADE) ×4 IMPLANT
BNDG GAUZE 4.5X4.1 6PLY STRL (MISCELLANEOUS) IMPLANT
BRA SURGICAL 2XLRG (MISCELLANEOUS) ×2 IMPLANT
BULB RESERV EVAC DRAIN JP 100C (MISCELLANEOUS) IMPLANT
CANISTER SUCT 1200ML W/VALVE (MISCELLANEOUS) ×2 IMPLANT
CHLORAPREP W/TINT 26ML (MISCELLANEOUS) ×2 IMPLANT
CNTNR SPEC 2.5X3XGRAD LEK (MISCELLANEOUS)
CONT SPEC 4OZ STER OR WHT (MISCELLANEOUS)
CONTAINER SPEC 2.5X3XGRAD LEK (MISCELLANEOUS) IMPLANT
COVER PROBE FLX POLY STRL (MISCELLANEOUS) ×2 IMPLANT
DEVICE DUBIN SPECIMEN MAMMOGRA (MISCELLANEOUS) ×2 IMPLANT
DRAIN CHANNEL JP 15F RND 16 (MISCELLANEOUS) IMPLANT
DRAPE LAPAROTOMY TRNSV 106X77 (MISCELLANEOUS) ×2 IMPLANT
DRSG TEGADERM 2-3/8X2-3/4 SM (GAUZE/BANDAGES/DRESSINGS) ×2 IMPLANT
DRSG TELFA 3X8 NADH (GAUZE/BANDAGES/DRESSINGS) ×2 IMPLANT
ELECT CAUTERY BLADE TIP 2.5 (TIP) ×2
ELECT REM PT RETURN 9FT ADLT (ELECTROSURGICAL) ×2
ELECTRODE CAUTERY BLDE TIP 2.5 (TIP) ×1 IMPLANT
ELECTRODE REM PT RTRN 9FT ADLT (ELECTROSURGICAL) ×1 IMPLANT
GAUZE FLUFF 18X24 1PLY STRL (GAUZE/BANDAGES/DRESSINGS) ×2 IMPLANT
GAUZE SPONGE 4X4 12PLY STRL (GAUZE/BANDAGES/DRESSINGS) IMPLANT
GLOVE BIO SURGEON STRL SZ7.5 (GLOVE) ×4 IMPLANT
GLOVE INDICATOR 8.0 STRL GRN (GLOVE) ×2 IMPLANT
GOWN STRL REUS W/ TWL LRG LVL3 (GOWN DISPOSABLE) ×2 IMPLANT
GOWN STRL REUS W/TWL LRG LVL3 (GOWN DISPOSABLE) ×2
KIT RM TURNOVER STRD PROC AR (KITS) ×2 IMPLANT
LABEL OR SOLS (LABEL) IMPLANT
MARGIN MAP 10MM (MISCELLANEOUS) ×2 IMPLANT
MARKER SKIN DUAL TIP RULER LAB (MISCELLANEOUS) ×2 IMPLANT
NDL SAFETY 22GX1.5 (NEEDLE) ×2 IMPLANT
NEEDLE HYPO 25X1 1.5 SAFETY (NEEDLE) ×6 IMPLANT
PACK BASIN MINOR ARMC (MISCELLANEOUS) ×2 IMPLANT
SHEARS FOC LG CVD HARMONIC 17C (MISCELLANEOUS) IMPLANT
SHEARS HARMONIC 9CM CVD (BLADE) IMPLANT
SLEVE PROBE SENORX GAMMA FIND (MISCELLANEOUS) ×2 IMPLANT
STRIP CLOSURE SKIN 1/2X4 (GAUZE/BANDAGES/DRESSINGS) ×2 IMPLANT
SUT ETHILON 3-0 FS-10 30 BLK (SUTURE) ×2
SUT SILK 2 0 (SUTURE)
SUT SILK 2-0 18XBRD TIE 12 (SUTURE) IMPLANT
SUT VIC AB 2-0 CT1 27 (SUTURE) ×3
SUT VIC AB 2-0 CT1 TAPERPNT 27 (SUTURE) ×3 IMPLANT
SUT VIC AB 3-0 SH 27 (SUTURE) ×1
SUT VIC AB 3-0 SH 27X BRD (SUTURE) ×1 IMPLANT
SUT VIC AB 4-0 FS2 27 (SUTURE) ×2 IMPLANT
SUT VICRYL+ 3-0 144IN (SUTURE) ×2 IMPLANT
SUTURE EHLN 3-0 FS-10 30 BLK (SUTURE) ×1 IMPLANT
SWABSTK COMLB BENZOIN TINCTURE (MISCELLANEOUS) ×2 IMPLANT
SYR BULB IRRIG 60ML STRL (SYRINGE) ×2 IMPLANT
SYR CONTROL 10ML (SYRINGE) ×2 IMPLANT
SYRINGE 10CC LL (SYRINGE) ×2 IMPLANT
TAPE TRANSPORE STRL 2 31045 (GAUZE/BANDAGES/DRESSINGS) IMPLANT
WATER STERILE IRR 1000ML POUR (IV SOLUTION) ×2 IMPLANT

## 2016-07-25 NOTE — Progress Notes (Signed)
Dr Bary Castilla in to talk with patient and her family.

## 2016-07-25 NOTE — Anesthesia Post-op Follow-up Note (Cosign Needed)
Anesthesia QCDR form completed.        

## 2016-07-25 NOTE — H&P (Signed)
No change in clinical history or exam. For right breast wide excision, SLN biopsy.  

## 2016-07-25 NOTE — Anesthesia Preprocedure Evaluation (Signed)
Anesthesia Evaluation  Patient identified by MRN, date of birth, ID band Patient awake    Reviewed: Allergy & Precautions, NPO status , Patient's Chart, lab work & pertinent test results  History of Anesthesia Complications Negative for: history of anesthetic complications  Airway Mallampati: III  TM Distance: >3 FB Neck ROM: Full    Dental no notable dental hx.    Pulmonary sleep apnea and Continuous Positive Airway Pressure Ventilation , neg COPD,    breath sounds clear to auscultation- rhonchi (-) wheezing      Cardiovascular hypertension, Pt. on medications (-) CAD and (-) Past MI  Rhythm:Regular Rate:Normal - Systolic murmurs and - Diastolic murmurs    Neuro/Psych  Headaches, negative psych ROS   GI/Hepatic negative GI ROS, Neg liver ROS,   Endo/Other  neg diabetesHypothyroidism   Renal/GU negative Renal ROS     Musculoskeletal negative musculoskeletal ROS (+)   Abdominal (+) + obese,   Peds  Hematology  (+) anemia ,   Anesthesia Other Findings Past Medical History: No date: Allergic state No date: Anemia 06/2016: Breast cancer of upper-outer quadrant of right*     Comment: ER 95%, PR 90%, HER-2/neu not overexpressed. No date: Family history of ovarian cancer     Comment: Pt is My Risk cancer genetic testing neg 2016 2016: Genetic testing     Comment: My Risk negative No date: Gross hematuria No date: Headache(784.0) No date: Hyperlipidemia No date: Hypertension No date: Hypothyroidism No date: Migraines No date: Morbid obesity ([place]) No date: Osteopenia No date: Osteopenia No date: Pernicious anemia 2010: Sleep apnea     Comment: uses CPAP   Reproductive/Obstetrics                             Anesthesia Physical Anesthesia Plan  ASA: III  Anesthesia Plan: General   Post-op Pain Management:    Induction: Intravenous  PONV Risk Score and Plan: 2 and Ondansetron,  Dexamethasone and Propofol  Airway Management Planned: Oral ETT  Additional Equipment:   Intra-op Plan:   Post-operative Plan: Extubation in OR  Informed Consent: I have reviewed the patients History and Physical, chart, labs and discussed the procedure including the risks, benefits and alternatives for the proposed anesthesia with the patient or authorized representative who has indicated his/her understanding and acceptance.   Dental advisory given  Plan Discussed with: CRNA and Anesthesiologist  Anesthesia Plan Comments:         Anesthesia Quick Evaluation

## 2016-07-25 NOTE — Anesthesia Procedure Notes (Signed)
Procedure Name: Intubation Date/Time: 07/25/2016 10:47 AM Performed by: Johnna Acosta Pre-anesthesia Checklist: Patient identified, Emergency Drugs available, Suction available, Patient being monitored and Timeout performed Patient Re-evaluated:Patient Re-evaluated prior to inductionOxygen Delivery Method: Circle system utilized Preoxygenation: Pre-oxygenation with 100% oxygen Intubation Type: IV induction Ventilation: Mask ventilation without difficulty and Oral airway inserted - appropriate to patient size Laryngoscope Size: Miller and 3 Grade View: Grade II Tube type: Oral Tube size: 7.5 mm Number of attempts: 1 Airway Equipment and Method: Stylet Placement Confirmation: ETT inserted through vocal cords under direct vision,  positive ETCO2 and breath sounds checked- equal and bilateral Secured at: 22 cm Tube secured with: Tape Dental Injury: Teeth and Oropharynx as per pre-operative assessment  Difficulty Due To: Difficulty was anticipated, Difficult Airway- due to large tongue and Difficult Airway- due to limited oral opening

## 2016-07-25 NOTE — Op Note (Signed)
Preoperative diagnosis: Right breast cancer, upper outer quadrant.  Postoperative diagnosis: Same.  Operative procedure: Right breast wide excision with ultrasound guidance, sentinel node biopsy.  Operating surgeon: Ollen Bowl, M.D.  Anesthesia: Gen. endotracheal, Marcaine 0.5% with 1-200,000 units of epinephrine, 30 mL.  Estimated blood loss: 5 mL.  Clinical note: This 56 year old woman had an abnormal mammogram and core biopsy showed evidence of an invasive mammary cancer. She desired breast conservation. She was injected with technetium sulfur colloid prior to the procedure.  Operative note: After induction of general endotracheal anesthesia 5 mL of  percent methylene blue was injected in the subareolar plexus. The breast was then taped to the left to provide better exposure of the axilla. The breast chest and axilla was then prepped with ChloraPrep and draped. Ultrasound is used to confirm location of the previously noted superficial mass within the 10:00 position about 7 cm from the nipple. Local anesthetic was infiltrated and a curvilinear incision was made. Beginning just 5 mm below the skin surface the 4 x 4 by 4 cm block of tissue was excised, orientated and sent for specimen radiograph. This confirmed the previously placed clip somewhat away from the mass evident on the films. Phone report from pathology showed the margins to be clear.  While the breast specimen was being processed attention was turned to the axilla. Counts were proximally 100-125 in the lower edge of the axilla. Local anesthetic was infiltrated and a transverse incision was made. A generous layer of adipose tissue was divided to an or the axillary and below. A prominent blue lymphatic was followed down to a hot, blue lymph node and adjacent to this was a hot, non-blue lymph node. After these were removed counts in the axilla were below 10% of the peak level noted at 1400. Hemostasis was achieved with electrocautery  and the deep tissue was approximated in layers with interrupted 2-0 Vicryl figure-of-eight sutures. The skin was closed with a running 4-0 Vicryl subcuticular suture.  Attention was turned toward reapproximating the breast wound. Due to the generous breast volume (52 DD) efforts were made to maintain a skin spacing should accelerated partial breast radiation be chosen. The breast was mobilized circumferentially and then approximated in layers with interrupted 2-0 Vicryl figure-of-eight sutures. This provided a good centimeter, 1.2 cm offer between the area for treatment and the overlying skin. The skin was approximated with a 3-0 Vicryl septic suture. Benzoin and Steri-Strips were applied. A Tegaderm dressing was applied to the axilla. A surgical bra with fluffed gauze for compression was placed and the patient taken to recovery in stable condition.

## 2016-07-25 NOTE — Transfer of Care (Signed)
Immediate Anesthesia Transfer of Care Note  Patient: Tammy Turner  Procedure(s) Performed: Procedure(s): PARTIAL MASTECTOMY WITH AXILLARY SENTINEL LYMPH NODE BIOPSY (Right)  Patient Location: PACU  Anesthesia Type:General  Level of Consciousness: awake and alert   Airway & Oxygen Therapy: Patient Spontanous Breathing and Patient connected to face mask oxygen  Post-op Assessment: Report given to RN and Post -op Vital signs reviewed and stable  Post vital signs: Reviewed and stable  Last Vitals:  Vitals:   07/25/16 0837  BP: 133/66  Pulse: 82  Resp: 12  Temp: 36.3 C    Last Pain:  Vitals:   07/25/16 0837  TempSrc: Tympanic         Complications: No apparent anesthesia complications

## 2016-07-25 NOTE — Anesthesia Postprocedure Evaluation (Signed)
Anesthesia Post Note  Patient: Waunetta Riggle  Procedure(s) Performed: Procedure(s) (LRB): PARTIAL MASTECTOMY WITH AXILLARY SENTINEL LYMPH NODE BIOPSY (Right)  Patient location during evaluation: PACU Anesthesia Type: General Level of consciousness: awake and alert and oriented Pain management: pain level controlled Vital Signs Assessment: post-procedure vital signs reviewed and stable Respiratory status: spontaneous breathing, nonlabored ventilation and respiratory function stable Cardiovascular status: blood pressure returned to baseline and stable Postop Assessment: no signs of nausea or vomiting Anesthetic complications: no     Last Vitals:  Vitals:   07/25/16 1245 07/25/16 1301  BP: (!) 138/93 125/90  Pulse: 97 95  Resp:  18  Temp: 36.4 C 36.7 C    Last Pain:  Vitals:   07/25/16 1301  TempSrc: Tympanic  PainSc: 6                  Montrell Cessna

## 2016-07-26 ENCOUNTER — Encounter: Payer: Self-pay | Admitting: General Surgery

## 2016-07-27 ENCOUNTER — Telehealth: Payer: Self-pay | Admitting: *Deleted

## 2016-07-27 ENCOUNTER — Encounter: Payer: Self-pay | Admitting: *Deleted

## 2016-07-27 LAB — SURGICAL PATHOLOGY

## 2016-07-27 NOTE — Telephone Encounter (Signed)
Patient's surgery has been scheduled for 08-08-16 at [facility].  My chart message sent per patient's request with the details.   Patient to keep appointment with Dr. Bary Castilla next week as scheduled.

## 2016-07-27 NOTE — Telephone Encounter (Signed)
I talked with the patient and she feels like it is a possible reaction to the anesthesia. She is having body aches "soreness like I went to a gym". Denies fever or chills. She states her legs and abdomen are the worst esp if she sneezes or coughs. She is taking tylenol and one aleve twice a day. Recommend increase to 2 alee BID and monitor symptoms, that if it is anesthesia hopefully on the down hill slope (24-48 hrs) and she will get better. Aware I will let Dr Bary Castilla know and get back with her if there is anything else to do, agrees.

## 2016-07-27 NOTE — Telephone Encounter (Signed)
Patient called, she had right mastectomy on 07/25/16 and everything is good with the surgery but she is concern because every muscle in her body is really sore. She wants to know what could cause this and its it something to be concern about.

## 2016-07-27 NOTE — Telephone Encounter (Signed)
-----   Message from Robert Bellow, MD sent at 07/27/2016 12:05 PM EDT ----- The patient has been notified that we have a positive margin and will need to go back to re-excise skin. This be about a 30 minute procedure. It's a 91478-? Code. Local with sedation. She is coming in Tuesday, but I told her you would give her a call at a time to look dates. Thank you

## 2016-07-28 ENCOUNTER — Other Ambulatory Visit: Payer: Self-pay

## 2016-08-02 ENCOUNTER — Encounter: Payer: Self-pay | Admitting: General Surgery

## 2016-08-02 ENCOUNTER — Ambulatory Visit (INDEPENDENT_AMBULATORY_CARE_PROVIDER_SITE_OTHER): Payer: 59 | Admitting: General Surgery

## 2016-08-02 VITALS — BP 122/64 | HR 80 | Resp 14 | Ht 60.0 in | Wt 258.0 lb

## 2016-08-02 DIAGNOSIS — E538 Deficiency of other specified B group vitamins: Secondary | ICD-10-CM | POA: Diagnosis not present

## 2016-08-02 DIAGNOSIS — Z170 Estrogen receptor positive status [ER+]: Secondary | ICD-10-CM

## 2016-08-02 DIAGNOSIS — C50411 Malignant neoplasm of upper-outer quadrant of right female breast: Secondary | ICD-10-CM

## 2016-08-02 NOTE — Progress Notes (Signed)
Patient ID: Tammy Turner, female   DOB: [birth year], 56 y.o.   MRN: [id number]  Chief Complaint  Patient presents with  . Routine Post Op  . Pre-op Exam    HPI Tammy Turner is a 56 y.o. female.Here today for postoperative visit, right partial mastectomy on 07-25-16, she states she is doing well.  Denies any gastrointestinal issues, bowels were constipated postoperatively but now are moving regular.  The patient had severe generalized muscle aches involving the thigh abdomen and shoulders developing on postoperative day 1. She did receive succinylcholine for induction of anesthesia.    Preop for re excision right breast mass scheduled for 08-08-16. She is here with husband, Tammy Turner.  HPI  Past Medical History:  Diagnosis Date  . Allergic state   . Anemia   . Breast cancer of upper-outer quadrant of right female breast ([place]) 06/2016   ER 95%, PR 90%, HER-2/neu not overexpressed.  . Family history of ovarian cancer    Pt is My Risk cancer genetic testing neg 2016  . Genetic testing 2016   My Risk negative  . Gross hematuria   . Headache(784.0)   . Hyperlipidemia   . Hypertension   . Hypothyroidism   . Migraines   . Morbid obesity ([place])   . Osteopenia   . Osteopenia   . Pernicious anemia   . Sleep apnea 2010   uses CPAP    Past Surgical History:  Procedure Laterality Date  . ABDOMINAL HYSTERECTOMY  2011   [place]; Dr. Laurey Morale, due to leio/adenomyosis  . CESAREAN SECTION     x 2  . CHOLECYSTECTOMY    . COLONOSCOPY    . COLONOSCOPY WITH PROPOFOL N/A 10/05/2015   Procedure: COLONOSCOPY WITH PROPOFOL;  Surgeon: Manya Silvas, MD;  Location: [facility] ENDOSCOPY;  Service: Endoscopy;  Laterality: N/A;  . DILATION AND CURETTAGE OF UTERUS    . FUNCTIONAL ENDOSCOPIC SINUS SURGERY    . PARTIAL MASTECTOMY WITH AXILLARY SENTINEL LYMPH NODE BIOPSY Right 07/25/2016   Procedure: PARTIAL MASTECTOMY WITH AXILLARY SENTINEL LYMPH NODE BIOPSY;  Surgeon: Robert Bellow, MD;  Location: [facility]  ORS;  Service: General;  Laterality: Right;    Family History  Problem Relation Age of Onset  . Pulmonary embolism Mother   . Hypertension Mother   . Hyperlipidemia Mother   . Hypertension Father   . Ovarian cancer Maternal Grandmother        66  . Colon cancer Paternal Grandmother        20  . Breast cancer Neg Hx     Social History Social History  Substance Use Topics  . Smoking status: Never Smoker  . Smokeless tobacco: Never Used  . Alcohol use No    Allergies  Allergen Reactions  . Morphine Other (See Comments)    Depressed respirations  . Niacin Anaphylaxis  . Iodine Hives and Itching  . Ivp Dye [Iodinated Diagnostic Agents] Hives and Itching  . Levaquin [Levofloxacin In D5w] Other (See Comments)    Tendonitis   . Cephalexin Rash  . Penicillins Hives and Rash    Has patient had a PCN reaction causing immediate rash, facial/tongue/throat swelling, SOB or lightheadedness with hypotension: No Has patient had a PCN reaction causing severe rash involving mucus membranes or skin necrosis: No Has patient had a PCN reaction that required hospitalization: No Has patient had a PCN reaction occurring within the last 10 years: Yes If all of the above answers are "NO", then may proceed with Cephalosporin use.   Marland Kitchen  Sulfa Antibiotics Rash    Current Outpatient Prescriptions  Medication Sig Dispense Refill  . ASCORBIC ACID PO Take 2 tablets by mouth every evening.    . Carboxymethylcellulose Sodium (THERATEARS OP) Place 1 drop into both eyes 2 (two) times daily as needed (dry eyes).    . cholecalciferol (VITAMIN D) 1000 units tablet Take 1,000 Units by mouth every evening.    . Cyanocobalamin (VITAMIN B-12 IJ) Inject 1,000 mcg as directed every 30 (thirty) days.    . Flaxseed, Linseed, (FLAX SEEDS PO) Take 1 capsule by mouth every evening.     . fluticasone (FLONASE) 50 MCG/ACT nasal spray Place 2 sprays into both nostrils daily.    Marland Kitchen levothyroxine (SYNTHROID, LEVOTHROID) 175  MCG tablet TAKE 1 TABLET BY MOUTH ONCE DAILY ON EMPTY STOMACH WITH GLASS OF WATER 30-60 MIN BEFORE BREAKFAST  1  . losartan-hydrochlorothiazide (HYZAAR) 100-25 MG per tablet Take 1 tablet by mouth daily. 90 tablet 3   No current facility-administered medications for this visit.     Review of Systems Review of Systems  Constitutional: Negative.   Respiratory: Negative.   Cardiovascular: Negative.   Gastrointestinal: Positive for constipation.    Blood pressure 122/64, pulse 80, resp. rate 14, height 5' (1.524 m), weight 258 lb (117 kg).  Physical Exam Physical Exam  Constitutional: She is oriented to person, place, and time. She appears well-developed and well-nourished.  Cardiovascular: Normal rate, regular rhythm and normal heart sounds.   Pulmonary/Chest: Effort normal and breath sounds normal.    Neurological: She is alert and oriented to person, place, and time.  Skin: Skin is warm and dry.    Data Reviewed 07/25/2016 wide excision:   T1b, N0, ER/PR positive, HER-2/neu negative. 10 mm lesion (5 mm on ultrasound and mammogram) sees.   Positive superficial margin. Patient scheduled for reexcision.  Assessment    Stage I carcinoma of the right breast with positive anterior (superficial) C margin. For reexcision. Node negative.    Plan    Indications for reexcision reviewed.  The patient underwent general endotracheal anesthesia for her last procedure. The severe muscle aches on postoperative day 1 are of great concern to her.  Options for management include using a nondepolarizing relaxant for intubation if required, completing the procedure with an LMA so the relaxation is not mandated (no clear history of reflux a stone record review or medications administered or completing the procedure under local anesthesia with sedation.    Patient scheduled for re excision right partial mastectomy on 08/08/16.  Arrangements were made for the patient get a informational book  from the [place]. She'll pick this up on the way home.  Website information provided.  Plans for medical oncology consultation if desired after reexcision was discussed.  Goals of anatomically allowing partial breast radiation if appropriate based on her very generous breast volume discussed.  HPI, Physical Exam, Assessment and Plan have been scribed under the direction and in the presence of Robert Bellow, MD.  Karie Fetch, RN  I have completed the exam and reviewed the above documentation for accuracy and completeness.  I agree with the above.  Haematologist has been used and any errors in dictation or transcription are unintentional.  Hervey Ard, M.D., F.A.C.S.  Robert Bellow 08/02/2016, 9:40 PM

## 2016-08-02 NOTE — Patient Instructions (Addendum)
Patient scheduled for re excision right partial mastectomy.

## 2016-08-03 ENCOUNTER — Encounter
Admission: RE | Admit: 2016-08-03 | Discharge: 2016-08-03 | Disposition: A | Discharge status: Home / Self Care | Payer: 59 | Source: Ambulatory Visit | Attending: General Surgery | Admitting: General Surgery

## 2016-08-03 HISTORY — DX: Adverse effect of unspecified anesthetic, initial encounter: T41.45XA

## 2016-08-03 HISTORY — DX: Other complications of anesthesia, initial encounter: T88.59XA

## 2016-08-03 NOTE — Patient Instructions (Addendum)
  Your procedure is scheduled on: 08-08-16 Report to Same Day Surgery 2nd floor medical mall [facility] Entrance-take elevator on left to 2nd floor.  Check in with surgery information desk.) To find out your arrival time please call [phone] between 1PM - 3PM on 08-05-16  Remember: Instructions that are not followed completely may result in serious medical risk, up to and including death, or upon the discretion of your surgeon and anesthesiologist your surgery may need to be rescheduled.    _x___ 1. Do not eat food or drink liquids after midnight. No gum chewing or hard candies.     __x__ 2. No Alcohol for 24 hours before or after surgery.   __x__3. No Smoking for 24 prior to surgery.   ____  4. Bring all medications with you on the day of surgery if instructed.    __x__ 5. Notify your doctor if there is any change in your medical condition     (cold, fever, infections).     Do not wear jewelry, make-up, hairpins, clips or nail polish.  Do not wear lotions, powders, or perfumes. You may wear deodorant.  Do not shave 48 hours prior to surgery. Men may shave face and neck.  Do not bring valuables to the hospital.    [facility] is not responsible for any belongings or valuables.               Contacts, dentures or bridgework may not be worn into surgery.  Leave your suitcase in the car. After surgery it may be brought to your room.  For patients admitted to the hospital, discharge time is determined by your treatment team.   Patients discharged the day of surgery will not be allowed to drive home.  You will need someone to drive you home and stay with you the night of your procedure.    Please read over the following fact sheets that you were given:      _x___ [place] WITH A SMALL SIP OF WATER. These include:  1. LEVOTHYROXINE  2.  3.  4.  5.  6.  ____Fleets enema or Magnesium Citrate as directed.   _x___ Use CHG Soap or sage  wipes as directed on instruction sheet   ____ Use inhalers on the day of surgery and bring to hospital day of surgery  ____ Stop Metformin and Janumet 2 days prior to surgery.    ____ Take 1/2 of usual insulin dose the night before surgery and none on the morning     surgery.   ____ Follow recommendations from Cardiologist, Pulmonologist or PCP regarding stopping Aspirin, Coumadin, Pllavix ,Eliquis, Effient, or Pradaxa, and Pletal.  ____Stop Anti-inflammatories such as Advil, Aleve, Ibuprofen, Motrin, Naproxen, Naprosyn, Goodies powders or aspirin products. OK to take Tylenol    _x___ Stop supplements until after surgery-STOP FLAXSEED NOW   _X___ Bring C-Pap to the hospital.

## 2016-08-05 ENCOUNTER — Encounter: Payer: Self-pay | Admitting: *Deleted

## 2016-08-08 ENCOUNTER — Ambulatory Visit: Payer: 59 | Admitting: Anesthesiology

## 2016-08-08 ENCOUNTER — Encounter
Admission: RE | Disposition: A | Discharge status: Home / Self Care | Payer: Self-pay | Source: Ambulatory Visit | Attending: General Surgery

## 2016-08-08 ENCOUNTER — Encounter: Payer: Self-pay | Admitting: *Deleted

## 2016-08-08 ENCOUNTER — Ambulatory Visit
Admission: RE | Admit: 2016-08-08 | Discharge: 2016-08-08 | Disposition: A | Discharge status: Home / Self Care | Payer: 59 | Source: Ambulatory Visit | Attending: General Surgery | Admitting: General Surgery

## 2016-08-08 DIAGNOSIS — Z9011 Acquired absence of right breast and nipple: Secondary | ICD-10-CM | POA: Insufficient documentation

## 2016-08-08 DIAGNOSIS — Z79899 Other long term (current) drug therapy: Secondary | ICD-10-CM | POA: Insufficient documentation

## 2016-08-08 DIAGNOSIS — E785 Hyperlipidemia, unspecified: Secondary | ICD-10-CM | POA: Insufficient documentation

## 2016-08-08 DIAGNOSIS — Z880 Allergy status to penicillin: Secondary | ICD-10-CM | POA: Diagnosis not present

## 2016-08-08 DIAGNOSIS — Z8041 Family history of malignant neoplasm of ovary: Secondary | ICD-10-CM | POA: Diagnosis not present

## 2016-08-08 DIAGNOSIS — L7634 Postprocedural seroma of skin and subcutaneous tissue following other procedure: Secondary | ICD-10-CM | POA: Diagnosis not present

## 2016-08-08 DIAGNOSIS — M8580 Other specified disorders of bone density and structure, unspecified site: Secondary | ICD-10-CM | POA: Diagnosis not present

## 2016-08-08 DIAGNOSIS — Z87892 Personal history of anaphylaxis: Secondary | ICD-10-CM | POA: Insufficient documentation

## 2016-08-08 DIAGNOSIS — G4730 Sleep apnea, unspecified: Secondary | ICD-10-CM | POA: Diagnosis not present

## 2016-08-08 DIAGNOSIS — Y848 Other medical procedures as the cause of abnormal reaction of the patient, or of later complication, without mention of misadventure at the time of the procedure: Secondary | ICD-10-CM | POA: Diagnosis not present

## 2016-08-08 DIAGNOSIS — Z7951 Long term (current) use of inhaled steroids: Secondary | ICD-10-CM | POA: Insufficient documentation

## 2016-08-08 DIAGNOSIS — Z170 Estrogen receptor positive status [ER+]: Secondary | ICD-10-CM | POA: Diagnosis not present

## 2016-08-08 DIAGNOSIS — G43909 Migraine, unspecified, not intractable, without status migrainosus: Secondary | ICD-10-CM | POA: Insufficient documentation

## 2016-08-08 DIAGNOSIS — Z91041 Radiographic dye allergy status: Secondary | ICD-10-CM | POA: Insufficient documentation

## 2016-08-08 DIAGNOSIS — Z9049 Acquired absence of other specified parts of digestive tract: Secondary | ICD-10-CM | POA: Diagnosis not present

## 2016-08-08 DIAGNOSIS — C50411 Malignant neoplasm of upper-outer quadrant of right female breast: Secondary | ICD-10-CM | POA: Diagnosis not present

## 2016-08-08 DIAGNOSIS — Z885 Allergy status to narcotic agent status: Secondary | ICD-10-CM | POA: Diagnosis not present

## 2016-08-08 DIAGNOSIS — Z800 Family history of malignant neoplasm of digestive organs: Secondary | ICD-10-CM | POA: Insufficient documentation

## 2016-08-08 DIAGNOSIS — Z882 Allergy status to sulfonamides status: Secondary | ICD-10-CM | POA: Insufficient documentation

## 2016-08-08 DIAGNOSIS — Z888 Allergy status to other drugs, medicaments and biological substances status: Secondary | ICD-10-CM | POA: Insufficient documentation

## 2016-08-08 DIAGNOSIS — Z90710 Acquired absence of both cervix and uterus: Secondary | ICD-10-CM | POA: Diagnosis not present

## 2016-08-08 DIAGNOSIS — Z8349 Family history of other endocrine, nutritional and metabolic diseases: Secondary | ICD-10-CM | POA: Insufficient documentation

## 2016-08-08 DIAGNOSIS — E039 Hypothyroidism, unspecified: Secondary | ICD-10-CM | POA: Diagnosis not present

## 2016-08-08 DIAGNOSIS — C50911 Malignant neoplasm of unspecified site of right female breast: Secondary | ICD-10-CM | POA: Diagnosis present

## 2016-08-08 DIAGNOSIS — Z8249 Family history of ischemic heart disease and other diseases of the circulatory system: Secondary | ICD-10-CM | POA: Insufficient documentation

## 2016-08-08 DIAGNOSIS — I10 Essential (primary) hypertension: Secondary | ICD-10-CM | POA: Insufficient documentation

## 2016-08-08 DIAGNOSIS — Z6841 Body Mass Index (BMI) 40.0 and over, adult: Secondary | ICD-10-CM | POA: Insufficient documentation

## 2016-08-08 DIAGNOSIS — Z9889 Other specified postprocedural states: Secondary | ICD-10-CM | POA: Insufficient documentation

## 2016-08-08 DIAGNOSIS — R897 Abnormal histological findings in specimens from other organs, systems and tissues: Secondary | ICD-10-CM | POA: Diagnosis not present

## 2016-08-08 DIAGNOSIS — Z853 Personal history of malignant neoplasm of breast: Secondary | ICD-10-CM | POA: Diagnosis not present

## 2016-08-08 HISTORY — PX: BREAST LUMPECTOMY: SHX2

## 2016-08-08 SURGERY — BREAST LUMPECTOMY
Anesthesia: General | Site: Breast | Laterality: Right | Wound class: Clean

## 2016-08-08 MED ORDER — LACTATED RINGERS IV SOLN
INTRAVENOUS | Status: DC
  Administered 2016-08-08: 09:00:00 via INTRAVENOUS

## 2016-08-08 MED ORDER — LIDOCAINE-EPINEPHRINE 1 %-1:100000 IJ SOLN
INTRAMUSCULAR | Status: DC | PRN
  Administered 2016-08-08: 30 mL

## 2016-08-08 MED ORDER — ONDANSETRON HCL 4 MG/2ML IJ SOLN
INTRAMUSCULAR | Status: DC | PRN
  Administered 2016-08-08: 4 mg via INTRAVENOUS

## 2016-08-08 MED ORDER — BUPIVACAINE HCL (PF) 0.5 % IJ SOLN
INTRAMUSCULAR | Status: DC | PRN
  Administered 2016-08-08: 30 mL

## 2016-08-08 MED ORDER — MIDAZOLAM HCL 2 MG/2ML IJ SOLN
INTRAMUSCULAR | Status: DC | PRN
  Administered 2016-08-08: 2 mg via INTRAVENOUS

## 2016-08-08 MED ORDER — PROPOFOL 500 MG/50ML IV EMUL
INTRAVENOUS | Status: DC | PRN
  Administered 2016-08-08: 100 ug/kg/min via INTRAVENOUS

## 2016-08-08 MED ORDER — FAMOTIDINE 20 MG PO TABS
20.0000 mg | ORAL_TABLET | Freq: Once | ORAL | Status: AC
  Administered 2016-08-08: 20 mg via ORAL

## 2016-08-08 MED ORDER — FENTANYL CITRATE (PF) 100 MCG/2ML IJ SOLN
INTRAMUSCULAR | Status: DC | PRN
Start: 2016-08-08 — End: 2016-08-08
  Administered 2016-08-08 (×3): 25 ug via INTRAVENOUS

## 2016-08-08 MED ORDER — PROMETHAZINE HCL 25 MG/ML IJ SOLN: 6.2500 mg | INTRAMUSCULAR | Status: DC | PRN

## 2016-08-08 MED ORDER — FENTANYL CITRATE (PF) 100 MCG/2ML IJ SOLN: 25.0000 ug | INTRAMUSCULAR | Status: DC | PRN

## 2016-08-08 SURGICAL SUPPLY — 49 items
BENZOIN TINCTURE PRP APPL 2/3 (GAUZE/BANDAGES/DRESSINGS) ×2 IMPLANT
BLADE SURG 15 STRL SS SAFETY (BLADE) ×4 IMPLANT
BRA SURGICAL 2XLRG (MISCELLANEOUS) IMPLANT
BRA SURGICAL LRG (MISCELLANEOUS) IMPLANT
BRA SURGICAL XLRG (MISCELLANEOUS) IMPLANT
BULB RESERV EVAC DRAIN JP 100C (MISCELLANEOUS) IMPLANT
CANISTER SUCT 1200ML W/VALVE (MISCELLANEOUS) ×2 IMPLANT
CHLORAPREP W/TINT 26ML (MISCELLANEOUS) ×2 IMPLANT
CNTNR SPEC 2.5X3XGRAD LEK (MISCELLANEOUS) ×1
CONT SPEC 4OZ STER OR WHT (MISCELLANEOUS) ×1
CONTAINER SPEC 2.5X3XGRAD LEK (MISCELLANEOUS) ×1 IMPLANT
COVER PROBE FLX POLY STRL (MISCELLANEOUS) ×2 IMPLANT
DRAIN CHANNEL JP 15F RND 16 (MISCELLANEOUS) IMPLANT
DRAPE LAPAROTOMY TRNSV 106X77 (MISCELLANEOUS) ×2 IMPLANT
DRSG TEGADERM 4X4.75 (GAUZE/BANDAGES/DRESSINGS) ×2 IMPLANT
DRSG TELFA 3X8 NADH (GAUZE/BANDAGES/DRESSINGS) ×2 IMPLANT
ELECT CAUTERY BLADE TIP 2.5 (TIP) ×2
ELECT REM PT RETURN 9FT ADLT (ELECTROSURGICAL) ×2
ELECTRODE CAUTERY BLDE TIP 2.5 (TIP) ×1 IMPLANT
ELECTRODE REM PT RTRN 9FT ADLT (ELECTROSURGICAL) ×1 IMPLANT
GAUZE FLUFF 18X24 1PLY STRL (GAUZE/BANDAGES/DRESSINGS) IMPLANT
GLOVE BIO SURGEON STRL SZ7.5 (GLOVE) ×2 IMPLANT
GLOVE INDICATOR 8.0 STRL GRN (GLOVE) ×2 IMPLANT
GOWN STRL REUS W/ TWL LRG LVL3 (GOWN DISPOSABLE) ×2 IMPLANT
GOWN STRL REUS W/TWL LRG LVL3 (GOWN DISPOSABLE) ×2
KIT RM TURNOVER STRD PROC AR (KITS) ×2 IMPLANT
LABEL OR SOLS (LABEL) ×2 IMPLANT
MARGIN MAP 10MM (MISCELLANEOUS) ×2 IMPLANT
NDL SAFETY 22GX1.5 (NEEDLE) ×2 IMPLANT
NEEDLE HYPO 25X1 1.5 SAFETY (NEEDLE) ×4 IMPLANT
PACK BASIN MINOR ARMC (MISCELLANEOUS) ×2 IMPLANT
PAD TELFA 2X3 NADH STRL (GAUZE/BANDAGES/DRESSINGS) ×2 IMPLANT
SHEARS FOC LG CVD HARMONIC 17C (MISCELLANEOUS) IMPLANT
SHEARS HARMONIC 9CM CVD (BLADE) IMPLANT
STRIP CLOSURE SKIN 1/2X4 (GAUZE/BANDAGES/DRESSINGS) ×2 IMPLANT
SUT ETHILON 3-0 FS-10 30 BLK (SUTURE) ×2
SUT SILK 2 0 (SUTURE)
SUT SILK 2-0 18XBRD TIE 12 (SUTURE) IMPLANT
SUT VIC AB 2-0 CT1 27 (SUTURE) ×1
SUT VIC AB 2-0 CT1 TAPERPNT 27 (SUTURE) ×1 IMPLANT
SUT VIC AB 4-0 FS2 27 (SUTURE) ×2 IMPLANT
SUT VICRYL+ 3-0 144IN (SUTURE) ×2 IMPLANT
SUTURE EHLN 3-0 FS-10 30 BLK (SUTURE) ×1 IMPLANT
SWABSTK COMLB BENZOIN TINCTURE (MISCELLANEOUS) ×2 IMPLANT
SYR BULB IRRIG 60ML STRL (SYRINGE) ×2 IMPLANT
SYR CONTROL 10ML (SYRINGE) ×2 IMPLANT
SYRINGE 10CC LL (SYRINGE) ×2 IMPLANT
TAPE TRANSPORE STRL 2 31045 (GAUZE/BANDAGES/DRESSINGS) ×2 IMPLANT
WATER STERILE IRR 1000ML POUR (IV SOLUTION) ×2 IMPLANT

## 2016-08-08 NOTE — Transfer of Care (Signed)
Immediate Anesthesia Transfer of Care Note  Patient: Tammy Turner  Procedure(s) Performed: Procedure(s): BREAST RE-EXCISION (Right)  Patient Location: PACU  Anesthesia Type:General  Level of Consciousness: awake, alert  and oriented  Airway & Oxygen Therapy: Patient Spontanous Breathing  Post-op Assessment: Report given to RN and Post -op Vital signs reviewed and stable  Post vital signs: Reviewed and stable  Last Vitals:  Vitals:   08/08/16 0900 08/08/16 1136  BP: (!) 156/79 129/63  Pulse: 77 87  Resp: 16 (!) 24  Temp: 36.6 C 36.9 C    Last Pain:  Vitals:   08/08/16 1136  TempSrc: Tympanic  PainSc:          Complications: No apparent anesthesia complications

## 2016-08-08 NOTE — Discharge Instructions (Signed)

## 2016-08-08 NOTE — Anesthesia Preprocedure Evaluation (Signed)
Anesthesia Evaluation  Patient identified by MRN, date of birth, ID band Patient awake    Reviewed: Allergy & Precautions, NPO status , Patient's Chart, lab work & pertinent test results  History of Anesthesia Complications (+) history of anesthetic complications (myalgias with succinylcholine)  Airway Mallampati: III  TM Distance: >3 FB Neck ROM: Full    Dental no notable dental hx.    Pulmonary sleep apnea and Continuous Positive Airway Pressure Ventilation , neg COPD,    breath sounds clear to auscultation- rhonchi (-) wheezing      Cardiovascular Exercise Tolerance: Good hypertension, Pt. on medications (-) CAD and (-) Past MI  Rhythm:Regular Rate:Normal - Systolic murmurs and - Diastolic murmurs    Neuro/Psych  Headaches, negative psych ROS   GI/Hepatic negative GI ROS, Neg liver ROS,   Endo/Other  neg diabetesHypothyroidism Morbid obesity  Renal/GU negative Renal ROS     Musculoskeletal negative musculoskeletal ROS (+)   Abdominal (+) + obese,   Peds  Hematology  (+) anemia ,   Anesthesia Other Findings Past Medical History: No date: Allergic state No date: Anemia 06/2016: Breast cancer of upper-outer quadrant of right*     Comment: ER 95%, PR 90%, HER-2/neu not overexpressed. No date: Family history of ovarian cancer     Comment: Pt is My Risk cancer genetic testing neg 2016 2016: Genetic testing     Comment: My Risk negative No date: Gross hematuria No date: Headache(784.0) No date: Hyperlipidemia No date: Hypertension No date: Hypothyroidism No date: Migraines No date: Morbid obesity ([place]) No date: Osteopenia No date: Osteopenia No date: Pernicious anemia 2010: Sleep apnea     Comment: uses CPAP   Reproductive/Obstetrics negative OB ROS                             Anesthesia Physical  Anesthesia Plan  ASA: III  Anesthesia Plan: General   Post-op Pain  Management:    Induction: Intravenous  PONV Risk Score and Plan: 3 and Ondansetron, Dexamethasone, Propofol and Midazolam  Airway Management Planned: Natural Airway and Nasal Cannula  Additional Equipment:   Intra-op Plan:   Post-operative Plan:   Informed Consent: I have reviewed the patients History and Physical, chart, labs and discussed the procedure including the risks, benefits and alternatives for the proposed anesthesia with the patient or authorized representative who has indicated his/her understanding and acceptance.   Dental advisory given  Plan Discussed with: CRNA and Anesthesiologist  Anesthesia Plan Comments:         Anesthesia Quick Evaluation

## 2016-08-08 NOTE — Anesthesia Post-op Follow-up Note (Cosign Needed)
Anesthesia QCDR form completed.        

## 2016-08-08 NOTE — H&P (Signed)
Positive anterior, superficial margin noted on recent wide excision of the upper-outer quadrant of the right breast. For reexcision of the overlying skin.  Anesthesia options have been reviewed with the patient after her significant muscular discomfort with succinylcholine during her last procedure.  Based on limited resection will plan to do local anesthesia with sedation.

## 2016-08-08 NOTE — Anesthesia Postprocedure Evaluation (Signed)
Anesthesia Post Note  Patient: Tammy Turner  Procedure(s) Performed: Procedure(s) (LRB): BREAST RE-EXCISION (Right)  Patient location during evaluation: PACU Anesthesia Type: General Level of consciousness: awake and alert Pain management: pain level controlled Vital Signs Assessment: post-procedure vital signs reviewed and stable Respiratory status: spontaneous breathing, nonlabored ventilation, respiratory function stable and patient connected to nasal cannula oxygen Cardiovascular status: blood pressure returned to baseline and stable Postop Assessment: no signs of nausea or vomiting Anesthetic complications: no     Last Vitals:  Vitals:   08/08/16 1200 08/08/16 1212  BP: 129/74 131/76  Pulse: 73 71  Resp: 17 17  Temp:  37.2 C    Last Pain:  Vitals:   08/08/16 1212  TempSrc:   PainSc: 0-No pain                 Martha Clan

## 2016-08-08 NOTE — Op Note (Signed)
Preoperative diagnosis: Invasive mammary carcinoma right breast, positive anterior margin.  Postoperative diagnosis: Same.  Operative procedure: Reexcision anterior margin.  Operating surgeon: Ollen Bowl, M.D.  Anesthesia: Attended local, 60 mL 0.5% Xylocaine with 0.25% Marcaine with 1-200,000 epinephrine.  Estimated blood loss: Minimal.  Clinical note: This 56 year old woman had previously undergone biopsy of a cancer in the upper-outer quadrant right breast. Wide excision was undertaken and gross examination in radiographic imaging was negative for grossly positive margins. On final report the anterior superficial margin was positive. She returns the operating today for planned local excision.  Operative note: With the patient currently spine on the operating table and rolled to the left, with adhesive used to distract the breast medially the areas prepped with ChloraPrep and draped. Local anesthetic was infiltrated. An ellipse of skin including the medial aspect of the original incision which would've encompassed the area of the positive margin was excised. Hemostasis was electrocautery. The deep tissue was approximated with interrupted 2-0 Vicryl figure-of-eight sutures. The adipose layer was approximated with a running 3-0 Vicryl suture. Skin was closed with a running 4-0 Vicryl septic suture. Benzoin, Steri-Strips, Telfa and Tegaderm dressing was applied.  Ultrasound examination prior to excision suggested there was at least a 1 cm area between the skin and the underlying seroma cavity which was evacuated at the time of the procedure.  Surgical bra was placed and the patient was taken to the recovery room in stable condition.

## 2016-08-09 LAB — SURGICAL PATHOLOGY

## 2016-08-15 ENCOUNTER — Telehealth: Payer: Self-pay

## 2016-08-15 ENCOUNTER — Ambulatory Visit: Payer: 59 | Admitting: General Surgery

## 2016-08-15 ENCOUNTER — Inpatient Hospital Stay: Payer: Self-pay

## 2016-08-15 VITALS — BP 132/89 | HR 78 | Temp 98.0°F | Resp 13 | Ht 60.0 in | Wt 253.0 lb

## 2016-08-15 DIAGNOSIS — Z170 Estrogen receptor positive status [ER+]: Principal | ICD-10-CM

## 2016-08-15 DIAGNOSIS — N6489 Other specified disorders of breast: Secondary | ICD-10-CM

## 2016-08-15 DIAGNOSIS — C50411 Malignant neoplasm of upper-outer quadrant of right female breast: Secondary | ICD-10-CM

## 2016-08-15 MED ORDER — SULFAMETHOXAZOLE-TRIMETHOPRIM 800-160 MG PO TABS: 1.0000 | ORAL_TABLET | Freq: Two times a day (BID) | ORAL | 0 refills | Status: DC

## 2016-08-15 NOTE — Progress Notes (Signed)
Patient ID: Tammy Turner, female   DOB: 10-29-1960, 56 y.o.   MRN: [id number]  Chief Complaint  Patient presents with  . Routine Post Op    HPI Tammy Turner is a 56 y.o. female Patient here for surgical site check. Patient had breast re excision surgery on 08/08/16. She states that yesterday she had a very large amount of drainage from the surgical site, watery reddish brown in color. It was enough to soak her bra and bed sheets. She spoke with the On call doctor and was told it was probably a hematoma that drained. This morning she has had very little drainage but is still concerned about the area. She states that she changed the dressing at lunch time today and only had a small amount of light brownish drainage. She states that she had a little bit of a low grade fever last week but it has been normal the past two days.   Dressing removed and very small spot of light brown drainage noted. Breast surgical site is clean. No hardness or localized heat noted. Temp 98.0  HPI  Past Medical History:  Diagnosis Date  . Allergic state   . Anemia   . Breast cancer of upper-outer quadrant of right female breast ([place]) 06/2016   ER 95%, PR 90%, HER-2/neu not overexpressed.  . Complication of anesthesia    SEVERE MUSCLE ACHES FROM DIAPHRAGM TO THIGHS X 2 DAYS AFTER PARTIAL MASTECTOMY ON 07-25-16-PT WAS GIVEN SUCCINYLCHOLINE FOR INDUCTION  . Family history of ovarian cancer    Pt is My Risk cancer genetic testing neg 2016  . Genetic testing 2016   My Risk negative  . Gross hematuria   . Headache(784.0)   . Hyperlipidemia   . Hypertension   . Hypothyroidism   . Migraines   . Morbid obesity ([place])   . Osteopenia   . Osteopenia   . Pernicious anemia   . Sleep apnea 2010   uses CPAP    Past Surgical History:  Procedure Laterality Date  . ABDOMINAL HYSTERECTOMY  2011   [place]; Dr. Laurey Morale, due to leio/adenomyosis  . BREAST LUMPECTOMY Right 08/08/2016   Procedure: BREAST RE-EXCISION;  Surgeon:  Robert Bellow, MD;  Location: [facility] ORS;  Service: General;  Laterality: Right;  . CESAREAN SECTION     x 2  . CHOLECYSTECTOMY    . COLONOSCOPY    . COLONOSCOPY WITH PROPOFOL N/A 10/05/2015   Procedure: COLONOSCOPY WITH PROPOFOL;  Surgeon: Manya Silvas, MD;  Location: [facility] ENDOSCOPY;  Service: Endoscopy;  Laterality: N/A;  . DILATION AND CURETTAGE OF UTERUS    . FUNCTIONAL ENDOSCOPIC SINUS SURGERY    . PARTIAL MASTECTOMY WITH AXILLARY SENTINEL LYMPH NODE BIOPSY Right 07/25/2016   Procedure: PARTIAL MASTECTOMY WITH AXILLARY SENTINEL LYMPH NODE BIOPSY;  Surgeon: Robert Bellow, MD;  Location: [facility] ORS;  Service: General;  Laterality: Right;    Family History  Problem Relation Age of Onset  . Pulmonary embolism Mother   . Hypertension Mother   . Hyperlipidemia Mother   . Hypertension Father   . Ovarian cancer Maternal Grandmother        35  . Colon cancer Paternal Grandmother        23  . Breast cancer Neg Hx     Social History Social History  Substance Use Topics  . Smoking status: Never Smoker  . Smokeless tobacco: Never Used  . Alcohol use No    Allergies  Allergen Reactions  . Morphine Other (See  Comments)    Depressed respirations  . Niacin Anaphylaxis  . Iodine Hives and Itching  . Ivp Dye [Iodinated Diagnostic Agents] Hives and Itching  . Levaquin [Levofloxacin In D5w] Other (See Comments)    Tendonitis   . Cephalexin Rash  . Penicillins Hives and Rash    Has patient had a PCN reaction causing immediate rash, facial/tongue/throat swelling, SOB or lightheadedness with hypotension: No Has patient had a PCN reaction causing severe rash involving mucus membranes or skin necrosis: No Has patient had a PCN reaction that required hospitalization: No Has patient had a PCN reaction occurring within the last 10 years: Yes If all of the above answers are "NO", then may proceed with Cephalosporin use.   . Sulfa Antibiotics Rash    Current Outpatient  Prescriptions  Medication Sig Dispense Refill  . ASCORBIC ACID PO Take 2 tablets by mouth every evening.    . Carboxymethylcellulose Sodium (THERATEARS OP) Place 1 drop into both eyes 2 (two) times daily as needed (dry eyes).    . cholecalciferol (VITAMIN D) 1000 units tablet Take 1,000 Units by mouth every evening.    . Cyanocobalamin (VITAMIN B-12 IJ) Inject 1,000 mcg as directed every 30 (thirty) days.    . Flaxseed, Linseed, (FLAX SEEDS PO) Take 1 capsule by mouth every evening.     . fluticasone (FLONASE) 50 MCG/ACT nasal spray Place 2 sprays into both nostrils daily.    Marland Kitchen levothyroxine (SYNTHROID, LEVOTHROID) 175 MCG tablet TAKE 1 TABLET BY MOUTH ONCE DAILY ON EMPTY STOMACH WITH GLASS OF WATER 30-60 MIN BEFORE BREAKFAST  1  . losartan-hydrochlorothiazide (HYZAAR) 100-25 MG per tablet Take 1 tablet by mouth daily. 90 tablet 3  . sulfamethoxazole-trimethoprim (BACTRIM DS,SEPTRA DS) 800-160 MG tablet Take 1 tablet by mouth 2 (two) times daily. 20 tablet 0   No current facility-administered medications for this visit.     Review of Systems Review of Systems  Blood pressure 132/89, pulse 78, temperature 98 F (36.7 C), resp. rate 13, height 5' (1.524 m), weight 253 lb (114.8 kg).  Physical Exam Physical Exam  Constitutional: She is oriented to person, place, and time. She appears well-developed and well-nourished.  Neurological: She is alert and oriented to person, place, and time.  Skin: Skin is warm and dry.  Psychiatric: She has a normal mood and affect.    Data Reviewed Ultrasound examination of the breast was undertaken showing a complex cystic mass in the area of wide excision..  Assessment    Spontaneous seroma drainage.    Plan    With the faint erythema on the lower half of the breast the patient will be placed on Bactrim DS one by mouth twice a day. She has multiple medical allergies 10 biotics. She reports as a child she had a rash with Bactrim and has had no  reintroduction since that time. She'll take her first dose of Bactrim "negative" and if she needs to make use of Benadryl for a rashshe will do so. If she does develop a rash with the first toe she'll make use of Benadryl with each additional dose.    HPI, Physical Exam, Assessment and Plan have been scribed under the direction and in the presence of Robert Bellow, MD  Concepcion Living, LPN  I have completed the exam and reviewed the above documentation for accuracy and completeness.  I agree with the above.  Haematologist has been used and any errors in dictation or transcription are unintentional.  Hervey Ard,  M.D., F.A.C.S.  Robert Bellow 08/16/2016, 8:07 PM

## 2016-08-15 NOTE — Progress Notes (Signed)
Patient ID: Tammy Turner, female   DOB: Jul 02, 1960, 56 y.o.   MRN: [id number]

## 2016-08-15 NOTE — Telephone Encounter (Signed)
Patient had breast re excision surgery on 08/08/16. She states that yesterday she had a very large amount of drainage from the surgical site, watery reddish brown in color. It was enough to soak her bra and bed sheets. She spoke with the On call doctor and was told it was probably a hematoma that drained. This morning she has had very little drainage but is still concerned about the area. Patient to come in today to have the nurse look at the area.

## 2016-08-17 ENCOUNTER — Encounter: Payer: Self-pay | Admitting: General Surgery

## 2016-08-17 ENCOUNTER — Ambulatory Visit (INDEPENDENT_AMBULATORY_CARE_PROVIDER_SITE_OTHER): Payer: 59 | Admitting: General Surgery

## 2016-08-17 VITALS — BP 106/60 | HR 82 | Temp 98.1°F | Ht 60.0 in | Wt 255.0 lb

## 2016-08-17 DIAGNOSIS — N6489 Other specified disorders of breast: Secondary | ICD-10-CM

## 2016-08-17 NOTE — Patient Instructions (Signed)
The patient is aware to call back for any questions or concerns.  

## 2016-08-17 NOTE — Progress Notes (Addendum)
Patient ID: Tammy Turner, female   DOB: 06-06-1960, 56 y.o.   MRN: [id number]  Chief Complaint  Patient presents with  . Routine Post Op    HPI Tammy Turner is a 56 y.o. female. Here for postoperative incision check. Patient had breast re excisionsurgery on 08/08/16. She states that yesterday she had a fairly large amount of drainage from the surgical site, yellowish red  in color. She did notice some discomfort int he breast over night. She is currently on bactrim. She states her temperature yesterday was 99.2  The patient has had no ill effect with the initiation of Bactrim therapy. HPI  Past Medical History:  Diagnosis Date  . Allergic state   . Anemia   . Breast cancer of upper-outer quadrant of right female breast ([place]) 06/2016   ER 95%, PR 90%, HER-2/neu not overexpressed.  . Complication of anesthesia    SEVERE MUSCLE ACHES FROM DIAPHRAGM TO THIGHS X 2 DAYS AFTER PARTIAL MASTECTOMY ON 07-25-16-PT WAS GIVEN SUCCINYLCHOLINE FOR INDUCTION  . Family history of ovarian cancer    Pt is My Risk cancer genetic testing neg 2016  . Genetic testing 2016   My Risk negative  . Gross hematuria   . Headache(784.0)   . Hyperlipidemia   . Hypertension   . Hypothyroidism   . Migraines   . Morbid obesity ([place])   . Osteopenia   . Osteopenia   . Pernicious anemia   . Sleep apnea 2010   uses CPAP    Past Surgical History:  Procedure Laterality Date  . ABDOMINAL HYSTERECTOMY  2011   [place]; Dr. Laurey Morale, due to leio/adenomyosis  . BREAST LUMPECTOMY Right 08/08/2016   Procedure: BREAST RE-EXCISION;  Surgeon: Robert Bellow, MD;  Location: [facility] ORS;  Service: General;  Laterality: Right;  . CESAREAN SECTION     x 2  . CHOLECYSTECTOMY    . COLONOSCOPY    . COLONOSCOPY WITH PROPOFOL N/A 10/05/2015   Procedure: COLONOSCOPY WITH PROPOFOL;  Surgeon: Manya Silvas, MD;  Location: [facility] ENDOSCOPY;  Service: Endoscopy;  Laterality: N/A;  . DILATION AND CURETTAGE OF UTERUS    . FUNCTIONAL  ENDOSCOPIC SINUS SURGERY    . PARTIAL MASTECTOMY WITH AXILLARY SENTINEL LYMPH NODE BIOPSY Right 07/25/2016   Procedure: PARTIAL MASTECTOMY WITH AXILLARY SENTINEL LYMPH NODE BIOPSY;  Surgeon: Robert Bellow, MD;  Location: [facility] ORS;  Service: General;  Laterality: Right;    Family History  Problem Relation Age of Onset  . Pulmonary embolism Mother   . Hypertension Mother   . Hyperlipidemia Mother   . Hypertension Father   . Ovarian cancer Maternal Grandmother        33  . Colon cancer Paternal Grandmother        36  . Breast cancer Neg Hx     Social History Social History  Substance Use Topics  . Smoking status: Never Smoker  . Smokeless tobacco: Never Used  . Alcohol use No    Allergies  Allergen Reactions  . Morphine Other (See Comments)    Depressed respirations  . Niacin Anaphylaxis  . Iodine Hives and Itching  . Ivp Dye [Iodinated Diagnostic Agents] Hives and Itching  . Levaquin [Levofloxacin In D5w] Other (See Comments)    Tendonitis   . Cephalexin Rash  . Penicillins Hives and Rash    Has patient had a PCN reaction causing immediate rash, facial/tongue/throat swelling, SOB or lightheadedness with hypotension: No Has patient had a PCN reaction causing severe rash involving  mucus membranes or skin necrosis: No Has patient had a PCN reaction that required hospitalization: No Has patient had a PCN reaction occurring within the last 10 years: Yes If all of the above answers are "NO", then may proceed with Cephalosporin use.   . Sulfa Antibiotics Rash    Current Outpatient Prescriptions  Medication Sig Dispense Refill  . ASCORBIC ACID PO Take 2 tablets by mouth every evening.    . Carboxymethylcellulose Sodium (THERATEARS OP) Place 1 drop into both eyes 2 (two) times daily as needed (dry eyes).    . cholecalciferol (VITAMIN D) 1000 units tablet Take 1,000 Units by mouth every evening.    . Cyanocobalamin (VITAMIN B-12 IJ) Inject 1,000 mcg as directed every 30  (thirty) days.    . Flaxseed, Linseed, (FLAX SEEDS PO) Take 1 capsule by mouth every evening.     . fluticasone (FLONASE) 50 MCG/ACT nasal spray Place 2 sprays into both nostrils daily.    Marland Kitchen levothyroxine (SYNTHROID, LEVOTHROID) 175 MCG tablet TAKE 1 TABLET BY MOUTH ONCE DAILY ON EMPTY STOMACH WITH GLASS OF WATER 30-60 MIN BEFORE BREAKFAST  1  . sulfamethoxazole-trimethoprim (BACTRIM DS,SEPTRA DS) 800-160 MG tablet Take 1 tablet by mouth 2 (two) times daily. 20 tablet 0  . losartan-hydrochlorothiazide (HYZAAR) 100-25 MG per tablet Take 1 tablet by mouth daily. (Patient not taking: Reported on 08/17/2016) 90 tablet 3   No current facility-administered medications for this visit.     Review of Systems Review of Systems  Constitutional: Positive for chills.  Respiratory: Negative.   Cardiovascular: Negative.     Blood pressure 106/60, pulse 82, temperature 98.1 F (36.7 C), temperature source Oral, height 5' (1.524 m), weight 255 lb (115.7 kg).  Physical Exam Physical Exam  Constitutional: She is oriented to person, place, and time. She appears well-developed and well-nourished.  Pulmonary/Chest:    Neurological: She is alert and oriented to person, place, and time.  Skin: Skin is warm and dry.  Psychiatric: Her behavior is normal.    Data Reviewed Based on the ultrasound from 07/16/2016 was elected to undertake incision and drainage of the underlying seroma. 10 mL of 1% Xylocaine plain was utilized well tolerated. ChloraPrep was applied to the skin. A 1 cm incision was made at the lateral edge of the wide excision site. 15 mL of blood was obtained. The cavity was irrigated with 20 mL of normal saline. A quarter inch Penrose drain was placed and anchored into position with a single 4-0 Prolene suture. Dry dressing applied.  Assessment    Seroma status post spontaneous drainage.    Plan    The patient will continue her present Bactrim which she is tolerating without ill  effect.  Patient may shower.    Follow up on Tuesday for drain removal by the nurse and with me the following week.  HPI, Physical Exam, Assessment and Plan have been scribed under the direction and in the presence of Robert Bellow, MD. Karie Fetch, RN  I have completed the exam and reviewed the above documentation for accuracy and completeness.  I agree with the above.  Haematologist has been used and any errors in dictation or transcription are unintentional.  Hervey Ard, M.D., F.A.C.S.  Robert Bellow 08/17/2016, 9:17 PM

## 2016-08-22 ENCOUNTER — Ambulatory Visit (INDEPENDENT_AMBULATORY_CARE_PROVIDER_SITE_OTHER): Payer: 59 | Admitting: *Deleted

## 2016-08-22 DIAGNOSIS — N6489 Other specified disorders of breast: Secondary | ICD-10-CM

## 2016-08-22 NOTE — Progress Notes (Signed)
Patient came in today for a wound check.  The wound is clean, with no signs of infection or redness.noted. The drain was removed. Follow up as scheduled.

## 2016-08-24 ENCOUNTER — Emergency Department
Admission: EM | Admit: 2016-08-24 | Discharge: 2016-08-24 | Disposition: A | Discharge status: Home / Self Care | Payer: 59 | Attending: Surgery | Admitting: Surgery

## 2016-08-24 ENCOUNTER — Encounter: Payer: Self-pay | Admitting: Emergency Medicine

## 2016-08-24 DIAGNOSIS — I10 Essential (primary) hypertension: Secondary | ICD-10-CM | POA: Insufficient documentation

## 2016-08-24 DIAGNOSIS — E039 Hypothyroidism, unspecified: Secondary | ICD-10-CM | POA: Diagnosis not present

## 2016-08-24 DIAGNOSIS — L7632 Postprocedural hematoma of skin and subcutaneous tissue following other procedure: Secondary | ICD-10-CM | POA: Diagnosis not present

## 2016-08-24 DIAGNOSIS — L0291 Cutaneous abscess, unspecified: Secondary | ICD-10-CM

## 2016-08-24 DIAGNOSIS — L7634 Postprocedural seroma of skin and subcutaneous tissue following other procedure: Secondary | ICD-10-CM | POA: Diagnosis not present

## 2016-08-24 DIAGNOSIS — R509 Fever, unspecified: Secondary | ICD-10-CM | POA: Diagnosis present

## 2016-08-24 LAB — CBC
HEMATOCRIT: 39.4 % (ref 35.0–47.0)
Hemoglobin: 13.5 g/dL (ref 12.0–16.0)
MCH: 29.8 pg (ref 26.0–34.0)
MCHC: 34.3 g/dL (ref 32.0–36.0)
MCV: 86.7 fL (ref 80.0–100.0)
PLATELETS: 237 10*3/uL (ref 150–440)
RBC: 4.54 MIL/uL (ref 3.80–5.20)
RDW: 13.5 % (ref 11.5–14.5)
WBC: 6 10*3/uL (ref 3.6–11.0)

## 2016-08-24 MED ORDER — DEXTROSE 5 % IV SOLN: INTRAVENOUS | Status: DC

## 2016-08-24 MED ORDER — LIDOCAINE-EPINEPHRINE (PF) 1 %-1:200000 IJ SOLN
10.0000 mL | Freq: Once | INTRAMUSCULAR | Status: AC
  Administered 2016-08-24: 10 mL
  Filled 2016-08-24: qty 10

## 2016-08-24 NOTE — ED Notes (Signed)
Wound culture sent to lab.

## 2016-08-24 NOTE — ED Provider Notes (Signed)
-----------------------------------------   11:12 PM on 08/24/2016 -----------------------------------------  Patient was seen directly by Dr. Rochel Brome. I provided the patient with discharge paperwork. I spoke with the patient, she states Dr. Tamala Julian that are answered all of her questions and have provided her with follow-up information. She had no further questions for myself.   Harvest Dark, MD 08/24/16 2312

## 2016-08-24 NOTE — Op Note (Signed)
OPERATIVE REPORT  PREOPERATIVE  DIAGNOSIS: . Postoperative right breast seroma with infection  POSTOPERATIVE DIAGNOSIS: . Postoperative right breast seroma with infection  PROCEDURE: . Incision and drainage of seroma  ANESTHESIA:  General  SURGEON: Rochel Brome  MD   INDICATIONS: . She had recent partial mastectomy and recent wound infection. Her drain was removed 2 days ago now having  chills and fever.  With the patient on the stretcher in the supine position the right breast was prepared with ChloraPrep and draped in a sterile manner. The skin surrounding the recent partial mastectomy wound was infiltrated with 1% Xylocaine with epinephrine. The patient did experience some hot sensations after the injection. Her blood pressure was slightly elevated and oxygen saturation 100%. This sensation subsequently resolved. An incision was made at approximately the junction of the lateral third and the middle third of the wound and dilated the tract with the scissors draining cloudy dark bloodstained drainage amounting to approximately 20 cc. A culture swab was submitted for culture. The small finger was introduced and could palpate the inside of the seroma and there were no identifiable loculations. 2 quarter-inch Penrose drains were inserted and held in place with a single 3-0 nylon suture. The site was further cleaned with towels and applied 4 x 4 cotton gauze dressings with 2 inch paper tape  The patient appeared to be in satisfactory condition and was observed in the emergency room for brief period of observation prior to discharge.  Rochel Brome M.D.

## 2016-08-24 NOTE — ED Triage Notes (Addendum)
Patient ambulatory to triage with steady gait, without difficulty or distress noted; pt reports right partial mastectomy & lymph node removal 6/18; removed more 7/2; developed post op infection and drain was placed and rx bactrim 7/9; drain removed Monday; tonight developed fever (101.8), body aches; no meds taken PTA; Dr Rochel Brome called [phone]) to give orders and will see pt in ED exam room, not to be seen by ED provider; charge nurse notified

## 2016-08-24 NOTE — ED Notes (Signed)
Per MD Tamala Julian Tammy Turner should be able to go home and follow up with doctor. Tammy Turner given water and tolerated well. Respirations even and unlabored Tammy Turner resting now comfortably.

## 2016-08-24 NOTE — ED Notes (Signed)
Pt up to toilet 

## 2016-08-24 NOTE — H&P (Signed)
Tammy Turner is an 56 y.o. female.   Chief Complaint: Fever HPI: She had recent right partial mastectomy with sentinel lymph node biopsy. There was involvement of margins. She therefore had a reexcision on July 2. Last week she had findings of a wound infection and was incised and drained with a Penrose drain and did have drainage of pus. The drain was removed 2 days ago. She reports she had some minimal drainage after the drain was removed and no drainage today. She called reporting chills and muscle aches and temperature 101.8. She therefore called and again came to the emergency room for evaluation  Past Medical History:  Diagnosis Date  . Allergic state   . Anemia   . Breast cancer of upper-outer quadrant of right female breast ([place]) 06/2016   ER 95%, PR 90%, HER-2/neu not overexpressed.  . Complication of anesthesia    SEVERE MUSCLE ACHES FROM DIAPHRAGM TO THIGHS X 2 DAYS AFTER PARTIAL MASTECTOMY ON 07-25-16-PT WAS GIVEN SUCCINYLCHOLINE FOR INDUCTION  . Family history of ovarian cancer    Pt is My Risk cancer genetic testing neg 2016  . Genetic testing 2016   My Risk negative  . Gross hematuria   . Headache(784.0)   . Hyperlipidemia   . Hypertension   . Hypothyroidism   . Migraines   . Morbid obesity ([place])   . Osteopenia   . Osteopenia   . Pernicious anemia   . Sleep apnea 2010   uses CPAP  We discussed her history of sleep apnea, pernicious anemia, history of obesity, history of hypertension  Past Surgical History:  Procedure Laterality Date  . ABDOMINAL HYSTERECTOMY  2011   [place]; Dr. Laurey Morale, due to leio/adenomyosis  . BREAST LUMPECTOMY Right 08/08/2016   Procedure: BREAST RE-EXCISION;  Surgeon: Robert Bellow, MD;  Location: [facility] ORS;  Service: General;  Laterality: Right;  . CESAREAN SECTION     x 2  . CHOLECYSTECTOMY    . COLONOSCOPY    . COLONOSCOPY WITH PROPOFOL N/A 10/05/2015   Procedure: COLONOSCOPY WITH PROPOFOL;  Surgeon: Manya Silvas, MD;  Location: [facility]  ENDOSCOPY;  Service: Endoscopy;  Laterality: N/A;  . DILATION AND CURETTAGE OF UTERUS    . FUNCTIONAL ENDOSCOPIC SINUS SURGERY    . PARTIAL MASTECTOMY WITH AXILLARY SENTINEL LYMPH NODE BIOPSY Right 07/25/2016   Procedure: PARTIAL MASTECTOMY WITH AXILLARY SENTINEL LYMPH NODE BIOPSY;  Surgeon: Robert Bellow, MD;  Location: [facility] ORS;  Service: General;  Laterality: Right;    Family History  Problem Relation Age of Onset  . Pulmonary embolism Mother   . Hypertension Mother   . Hyperlipidemia Mother   . Hypertension Father   . Ovarian cancer Maternal Grandmother        70  . Colon cancer Paternal Grandmother        13  . Breast cancer Neg Hx    Social History:  reports that she has never smoked. She has never used smokeless tobacco. She reports that she does not drink alcohol or use drugs.  Allergies:  Allergies  Allergen Reactions  . Morphine Other (See Comments)    Depressed respirations  . Niacin Anaphylaxis  . Iodine Hives and Itching  . Ivp Dye [Iodinated Diagnostic Agents] Hives and Itching  . Levaquin [Levofloxacin In D5w] Other (See Comments)    Tendonitis   . Cephalexin Rash  . Penicillins Hives and Rash    Has patient had a PCN reaction causing immediate rash, facial/tongue/throat swelling, SOB or lightheadedness with  hypotension: No Has patient had a PCN reaction causing severe rash involving mucus membranes or skin necrosis: No Has patient had a PCN reaction that required hospitalization: No Has patient had a PCN reaction occurring within the last 10 years: Yes If all of the above answers are "NO", then may proceed with Cephalosporin use.      (Not in a hospital admission)  Results for orders placed or performed during the hospital encounter of 08/24/16 (from the past 48 hour(s))  CBC     Status: None   Collection Time: 08/24/16  8:38 PM  Result Value Ref Range   WBC 6.0 3.6 - 11.0 K/uL   RBC 4.54 3.80 - 5.20 MIL/uL   Hemoglobin 13.5 12.0 - 16.0 g/dL    HCT 39.4 35.0 - 47.0 %   MCV 86.7 80.0 - 100.0 fL   MCH 29.8 26.0 - 34.0 pg   MCHC 34.3 32.0 - 36.0 g/dL   RDW 13.5 11.5 - 14.5 %   Platelets 237 150 - 440 K/uL   No results found.  Blood pressure 106/73, pulse (!) 108, temperature (!) 100.7 F (38.2 C), temperature source Oral, resp. rate 20, height 5' (1.524 m), weight 248 lb (112.5 kg), SpO2 97 %.  Physical Exam:  GENERAL:  Awake alert and oriented and in minimal acute distress.  HEENT:  Head is normocephalic.  Pupils are equal reactive to light.  Extraocular movements are intact. Sclera is clear.  Pharynx is clear.  NECK:  Supple with no palpable mass and no adenopathy.  LUNGS:  Clear without rales rhonchi or wheezes.  HEART:  Regular rhythm S1-S2, without murmur.  RIGHT BREAST: The axillary wound appears to be well-healed and is soft. The breast wound in the upper outer quadrant is dry. There is no significant erythema. There is moderate degree of tenderness. There is mass formation beneath the scar which is approximated 7 cm in dimension. The remainder of the breast is without erythema.  NEUROLOGIC:  Awake alert and moving all extremities.  EXTREMITIES:  Well-developed well-nourished with no dependent edema.  Assessment/Plan With the recent wound infection this appears to be highly suspicious for recurrent wound infection  I recommended incision and drainage. I discussed the procedure with her and we will do it here in the ER procedure room.  Rochel Brome, MD 08/24/2016, 9:18 PM

## 2016-08-24 NOTE — ED Notes (Signed)
MD Leodis Binet at bedside to insert Penrose to pt's right breast. Pt numbed and procedure started. Pt tolerated procedure well. Penrose inserted and incision closed up. Wound culture swab taken and sent to lab for test.

## 2016-08-24 NOTE — Discharge Instructions (Addendum)
Take Tylenol as needed for pain or fever.  Change dressing tomorrow, tucking gauze in bra and change 2 times per day and as needed for drainage.  Contact Dr. Dwyane Luo office tomorrow for follow-up appointment and recommendations regarding antibiotic treatment.  Resume usual activities as tolerated.

## 2016-08-25 ENCOUNTER — Telehealth: Payer: Self-pay | Admitting: *Deleted

## 2016-08-25 ENCOUNTER — Ambulatory Visit (INDEPENDENT_AMBULATORY_CARE_PROVIDER_SITE_OTHER): Payer: 59 | Admitting: *Deleted

## 2016-08-25 VITALS — Temp 99.9°F

## 2016-08-25 DIAGNOSIS — C50411 Malignant neoplasm of upper-outer quadrant of right female breast: Secondary | ICD-10-CM

## 2016-08-25 DIAGNOSIS — Z170 Estrogen receptor positive status [ER+]: Secondary | ICD-10-CM

## 2016-08-25 MED ORDER — DOXYCYCLINE HYCLATE 100 MG PO CAPS: 100.0000 mg | ORAL_CAPSULE | Freq: Two times a day (BID) | ORAL | 0 refills | Status: DC

## 2016-08-25 MED ORDER — SULFAMETHOXAZOLE-TRIMETHOPRIM 800-160 MG PO TABS: 1.0000 | ORAL_TABLET | Freq: Two times a day (BID) | ORAL | 0 refills | Status: DC

## 2016-08-25 NOTE — Progress Notes (Signed)
Patient ID: Tammy Turner, female   DOB: [birth year], 56 y.o.   MRN: [id number]  Patient came in today for a wound check post breast re excision with seroma.  The wound is clean, with firmness and redness noted. Temp today 99.9. Culture completed in ED last evening with Dr Nicholes Stairs and Penrose drain placed.  Patient already on bactrim last dose today, Bactrim DS RX #20 sent per Dr Bary Castilla. She is still having nausea, body aches and chills while in the office, temp rechecked 100.1.  Dr Jamal Collin present. Minimal erythremia around drain site. Given multiple allergies to ATB will changd to doxycycline 100 mg BID # 20 in place of bactrim, treat fever symptoms Advil or tylenol. Rest for today. Aware to called for worsening symptoms. She will call around 4 pm with status report. Follow up as scheduled on Tuesday.

## 2016-08-25 NOTE — Telephone Encounter (Signed)
appt made for f/u this morning.

## 2016-08-25 NOTE — Telephone Encounter (Signed)
She called to let us know she had taken the new antibiotic and 2 doses of tylenol, her current temp is 100.7. She states she still feels kinda bad with the fever but no worse than this morning. She still has the right neck node and body aches.  Dr Jamal Collin aware and instructed pt to call with status report around 11:00 in the morning. She is comfortable monitoring for now. Aware there is always an MD on call if things change, pt agrees.

## 2016-08-25 NOTE — Patient Instructions (Signed)
The patient is aware to call back for any questions or concerns.  

## 2016-08-26 ENCOUNTER — Ambulatory Visit (INDEPENDENT_AMBULATORY_CARE_PROVIDER_SITE_OTHER): Payer: 59 | Admitting: General Surgery

## 2016-08-26 ENCOUNTER — Telehealth: Payer: Self-pay

## 2016-08-26 DIAGNOSIS — N6489 Other specified disorders of breast: Secondary | ICD-10-CM

## 2016-08-26 NOTE — Progress Notes (Signed)
Subjective:     Patient ID: Carman Ching, female   DOB: [birth year], 56 y.o.   MRN: [id number]  HPI   Review of Systems  Constitutional: Positive for chills and fever.  HENT: Positive for sore throat.   Respiratory: Negative.   Gastrointestinal: Negative.   Genitourinary: Negative.   Musculoskeletal: Positive for arthralgias.  Hematological: Positive for adenopathy.  Psychiatric/Behavioral: Negative.        Objective:   Physical Exam  Constitutional: She appears well-developed and well-nourished.  HENT:  Head: Normocephalic.  Mouth/Throat:    Neck: Neck supple. No thyromegaly present.  Cardiovascular: Normal rate and regular rhythm.   Pulmonary/Chest: Effort normal and breath sounds normal.    Lymphadenopathy:       Head (right side): Submental and submandibular adenopathy present.    She has cervical adenopathy.       Right cervical: Superficial cervical adenopathy present.    She has no axillary adenopathy.       Right: No supraclavicular adenopathy present.   Laboratory Culture obtained in the emergency department on July 18 is no growth. White cells identified. No organisms.  CBC of the same date showed a white blood cell count of 6000.    Assessment:     Breast seroma, drained, not clearly source of present fever/chills.  Myalgias, sore throat, cervical lymphadenopathy, possible viral syndrome versus reaction to Bactrim.    Plan:     Patient will discontinue all antibiotics. Aleve/Tylenol for fever. Follow-up as scheduled on July 24, will call if symptoms worsen.

## 2016-08-26 NOTE — Telephone Encounter (Signed)
Patient called and states that she woke up with hives this morning. She has them everywhere even on the bottom of her feet. She did not take her am dose of Doxycycline for fear of a worsening reaction. She did take oral Benadryl and has started using the Benadryl cream. She has not taken any Tylenol today and states that her fever is 100.1. She does report that over all she does feel a little better. Reports still having some yellow-brown drainage from her drain tube. Her right neck lymph node is still swollen. She had a right inguinal lymph node swell up and become painful last night but it is back to normal this morning.  Dr Jamal Collin notified of such and he will speak with Dr Bary Castilla about the next step for the patient.

## 2016-08-30 ENCOUNTER — Ambulatory Visit (INDEPENDENT_AMBULATORY_CARE_PROVIDER_SITE_OTHER): Payer: 59 | Admitting: General Surgery

## 2016-08-30 ENCOUNTER — Encounter: Payer: Self-pay | Admitting: General Surgery

## 2016-08-30 VITALS — BP 132/80 | HR 74 | Temp 98.2°F | Resp 14 | Ht 60.0 in | Wt 252.0 lb

## 2016-08-30 DIAGNOSIS — N6489 Other specified disorders of breast: Secondary | ICD-10-CM

## 2016-08-30 LAB — AEROBIC/ANAEROBIC CULTURE W GRAM STAIN (SURGICAL/DEEP WOUND)

## 2016-08-30 LAB — AEROBIC/ANAEROBIC CULTURE (SURGICAL/DEEP WOUND): CULTURE: NO GROWTH

## 2016-08-30 NOTE — Progress Notes (Signed)
Patient ID: Tammy Turner, female   DOB: 11-23-1960, 56 y.o.   MRN: [id number]  Chief Complaint  Patient presents with  . Other    right breast seroma    HPI Tammy Turner is a 56 y.o. female here today for her two week follow right breast seroma. Patient states her fever broke on Saturday (08/28/2016).This was 24 hours after didn't discontinuation of all antibiotics.  Area still has been draining.  HPI  Past Medical History:  Diagnosis Date  . Allergic state   . Anemia   . Breast cancer of upper-outer quadrant of right female breast ([place]) 06/2016   ER 95%, PR 90%, HER-2/neu not overexpressed.  . Complication of anesthesia    SEVERE MUSCLE ACHES FROM DIAPHRAGM TO THIGHS X 2 DAYS AFTER PARTIAL MASTECTOMY ON 07-25-16-PT WAS GIVEN SUCCINYLCHOLINE FOR INDUCTION  . Family history of ovarian cancer    Pt is My Risk cancer genetic testing neg 2016  . Genetic testing 2016   My Risk negative  . Gross hematuria   . Headache(784.0)   . Hyperlipidemia   . Hypertension   . Hypothyroidism   . Migraines   . Morbid obesity ([place])   . Osteopenia   . Osteopenia   . Pernicious anemia   . Sleep apnea 2010   uses CPAP    Past Surgical History:  Procedure Laterality Date  . ABDOMINAL HYSTERECTOMY  2011   [place]; Dr. Laurey Morale, due to leio/adenomyosis  . BREAST LUMPECTOMY Right 08/08/2016   Procedure: BREAST RE-EXCISION;  Surgeon: Robert Bellow, MD;  Location: [facility] ORS;  Service: General;  Laterality: Right;  . CESAREAN SECTION     x 2  . CHOLECYSTECTOMY    . COLONOSCOPY    . COLONOSCOPY WITH PROPOFOL N/A 10/05/2015   Procedure: COLONOSCOPY WITH PROPOFOL;  Surgeon: Manya Silvas, MD;  Location: [facility] [place] ENDOSCOPY;  Service: Endoscopy;  Laterality: N/A;  . DILATION AND CURETTAGE OF UTERUS    . FUNCTIONAL ENDOSCOPIC SINUS SURGERY    . PARTIAL MASTECTOMY WITH AXILLARY SENTINEL LYMPH NODE BIOPSY Right 07/25/2016   Procedure: PARTIAL MASTECTOMY WITH AXILLARY SENTINEL LYMPH NODE BIOPSY;  Surgeon:  Robert Bellow, MD;  Location: [facility] ORS;  Service: General;  Laterality: Right;    Family History  Problem Relation Age of Onset  . Pulmonary embolism Mother   . Hypertension Mother   . Hyperlipidemia Mother   . Hypertension Father   . Ovarian cancer Maternal Grandmother        52  . Colon cancer Paternal Grandmother        73  . Breast cancer Neg Hx     Social History Social History  Substance Use Topics  . Smoking status: Never Smoker  . Smokeless tobacco: Never Used  . Alcohol use No    Allergies  Allergen Reactions  . Morphine Other (See Comments)    Depressed respirations  . Niacin Anaphylaxis  . Doxycycline Hives  . Iodine Hives and Itching  . Ivp Dye [Iodinated Diagnostic Agents] Hives and Itching  . Levaquin [Levofloxacin In D5w] Other (See Comments)    Tendonitis   . Cephalexin Rash  . Penicillins Hives and Rash    Has patient had a PCN reaction causing immediate rash, facial/tongue/throat swelling, SOB or lightheadedness with hypotension: No Has patient had a PCN reaction causing severe rash involving mucus membranes or skin necrosis: No Has patient had a PCN reaction that required hospitalization: No Has patient had a PCN reaction occurring within the last 10  years: Yes If all of the above answers are "NO", then may proceed with Cephalosporin use.     Current Outpatient Prescriptions  Medication Sig Dispense Refill  . ASCORBIC ACID PO Take 2 tablets by mouth every evening.    . Carboxymethylcellulose Sodium (THERATEARS OP) Place 1 drop into both eyes 2 (two) times daily as needed (dry eyes).    . cholecalciferol (VITAMIN D) 1000 units tablet Take 1,000 Units by mouth every evening.    . Cyanocobalamin (VITAMIN B-12 IJ) Inject 1,000 mcg as directed every 30 (thirty) days.    . Flaxseed, Linseed, (FLAX SEEDS PO) Take 1 capsule by mouth every evening.     . fluticasone (FLONASE) 50 MCG/ACT nasal spray Place 2 sprays into both nostrils daily.    Marland Kitchen  levothyroxine (SYNTHROID, LEVOTHROID) 175 MCG tablet TAKE 1 TABLET BY MOUTH ONCE DAILY ON EMPTY STOMACH WITH GLASS OF WATER 30-60 MIN BEFORE BREAKFAST  1  . losartan-hydrochlorothiazide (HYZAAR) 100-25 MG per tablet Take 1 tablet by mouth daily. 90 tablet 3   No current facility-administered medications for this visit.     Review of Systems Review of Systems  Constitutional: Negative.   Respiratory: Negative.   Cardiovascular: Negative.     Blood pressure 132/80, pulse 74, temperature 98.2 F (36.8 C), temperature source Oral, resp. rate 14, height 5' (1.524 m), weight 252 lb (114.3 kg).  Physical Exam Physical Exam  Constitutional: She is oriented to person, place, and time. She appears well-developed and well-nourished.  Pulmonary/Chest:    Neurological: She is alert and oriented to person, place, and time.  Skin: Skin is warm and dry.   Drain was removed.  Data Reviewed 08/24/2016 culture results: No growth at 5 days. Rare WBC.  Assessment    Study improvement in seroma post drainage. No evidence of active infection.    Plan    Patient will likely require whole breast radiation based on the events of healing after reexcision.    Patient to return in one week. The patient is aware to call back for any questions or concerns.  HPI, Physical Exam, Assessment and Plan have been scribed under the direction and in the presence of Hervey Ard, MD.  Gaspar Cola, CMA  I have completed the exam and reviewed the above documentation for accuracy and completeness.  I agree with the above.  Haematologist has been used and any errors in dictation or transcription are unintentional.  Hervey Ard, M.D., F.A.C.S.  Robert Bellow 08/30/2016, 12:25 PM

## 2016-09-07 ENCOUNTER — Ambulatory Visit (INDEPENDENT_AMBULATORY_CARE_PROVIDER_SITE_OTHER): Payer: 59 | Admitting: General Surgery

## 2016-09-07 ENCOUNTER — Encounter: Payer: Self-pay | Admitting: General Surgery

## 2016-09-07 VITALS — BP 104/62 | HR 74 | Resp 14 | Ht 60.0 in | Wt 250.0 lb

## 2016-09-07 DIAGNOSIS — Z170 Estrogen receptor positive status [ER+]: Secondary | ICD-10-CM

## 2016-09-07 DIAGNOSIS — C50411 Malignant neoplasm of upper-outer quadrant of right female breast: Secondary | ICD-10-CM

## 2016-09-07 NOTE — Progress Notes (Signed)
Patient ID: Tammy Turner, female   DOB: September 25, 1960, 56 y.o.   MRN: [id number]  Chief Complaint  Patient presents with  . Routine Post Op    HPI Tammy Turner is a 56 y.o. female.  here today for her follow right breast seroma. She seems to think the area has improved, only minimal drainage noted on her pads.  HPI  Past Medical History:  Diagnosis Date  . Allergic state   . Anemia   . Breast cancer of upper-outer quadrant of right female breast ([place]) 06/2016   ER 95%, PR 90%, HER-2/neu not overexpressed.  . Complication of anesthesia    SEVERE MUSCLE ACHES FROM DIAPHRAGM TO THIGHS X 2 DAYS AFTER PARTIAL MASTECTOMY ON 07-25-16-PT WAS GIVEN SUCCINYLCHOLINE FOR INDUCTION  . Family history of ovarian cancer    Pt is My Risk cancer genetic testing neg 2016  . Genetic testing 2016   My Risk negative  . Gross hematuria   . Headache(784.0)   . Hyperlipidemia   . Hypertension   . Hypothyroidism   . Migraines   . Morbid obesity ([place])   . Osteopenia   . Osteopenia   . Pernicious anemia   . Sleep apnea 2010   uses CPAP    Past Surgical History:  Procedure Laterality Date  . ABDOMINAL HYSTERECTOMY  2011   [place]; Dr. Laurey Morale, due to leio/adenomyosis  . BREAST LUMPECTOMY Right 08/08/2016   Procedure: BREAST RE-EXCISION;  Surgeon: Robert Bellow, MD;  Location: [facility] ORS;  Service: General;  Laterality: Right;  . CESAREAN SECTION     x 2  . CHOLECYSTECTOMY    . COLONOSCOPY    . COLONOSCOPY WITH PROPOFOL N/A 10/05/2015   Procedure: COLONOSCOPY WITH PROPOFOL;  Surgeon: Manya Silvas, MD;  Location: [facility] ENDOSCOPY;  Service: Endoscopy;  Laterality: N/A;  . DILATION AND CURETTAGE OF UTERUS    . FUNCTIONAL ENDOSCOPIC SINUS SURGERY    . PARTIAL MASTECTOMY WITH AXILLARY SENTINEL LYMPH NODE BIOPSY Right 07/25/2016   Procedure: PARTIAL MASTECTOMY WITH AXILLARY SENTINEL LYMPH NODE BIOPSY;  Surgeon: Robert Bellow, MD;  Location: [facility] ORS;  Service: General;  Laterality: Right;    Family  History  Problem Relation Age of Onset  . Pulmonary embolism Mother   . Hypertension Mother   . Hyperlipidemia Mother   . Hypertension Father   . Ovarian cancer Maternal Grandmother        58  . Colon cancer Paternal Grandmother        6  . Breast cancer Neg Hx     Social History Social History  Substance Use Topics  . Smoking status: Never Smoker  . Smokeless tobacco: Never Used  . Alcohol use No    Allergies  Allergen Reactions  . Morphine Other (See Comments)    Depressed respirations  . Niacin Anaphylaxis  . Doxycycline Hives  . Iodine Hives and Itching  . Ivp Dye [Iodinated Diagnostic Agents] Hives and Itching  . Levaquin [Levofloxacin In D5w] Other (See Comments)    Tendonitis   . Cephalexin Rash  . Penicillins Hives and Rash    Has patient had a PCN reaction causing immediate rash, facial/tongue/throat swelling, SOB or lightheadedness with hypotension: No Has patient had a PCN reaction causing severe rash involving mucus membranes or skin necrosis: No Has patient had a PCN reaction that required hospitalization: No Has patient had a PCN reaction occurring within the last 10 years: Yes If all of the above answers are "NO", then may proceed  with Cephalosporin use.     Current Outpatient Prescriptions  Medication Sig Dispense Refill  . ASCORBIC ACID PO Take 2 tablets by mouth every evening.    . Carboxymethylcellulose Sodium (THERATEARS OP) Place 1 drop into both eyes 2 (two) times daily as needed (dry eyes).    . cholecalciferol (VITAMIN D) 1000 units tablet Take 1,000 Units by mouth every evening.    . Cyanocobalamin (VITAMIN B-12 IJ) Inject 1,000 mcg as directed every 30 (thirty) days.    . Flaxseed, Linseed, (FLAX SEEDS PO) Take 1 capsule by mouth every evening.     . fluticasone (FLONASE) 50 MCG/ACT nasal spray Place 2 sprays into both nostrils daily.    Marland Kitchen levothyroxine (SYNTHROID, LEVOTHROID) 175 MCG tablet TAKE 1 TABLET BY MOUTH ONCE DAILY ON EMPTY STOMACH  WITH GLASS OF WATER 30-60 MIN BEFORE BREAKFAST  1   No current facility-administered medications for this visit.     Review of Systems Review of Systems  Constitutional: Negative.   Respiratory: Negative.   Cardiovascular: Negative.     Blood pressure 104/62, pulse 74, resp. rate 14, height 5' (1.524 m), weight 250 lb (113.4 kg).  Physical Exam Physical Exam  Constitutional: She is oriented to person, place, and time. She appears well-developed and well-nourished.  Pulmonary/Chest:    Mild tenderness right breast,thickening in area behind wide excision site  Neurological: She is alert and oriented to person, place, and time.  Skin: Skin is warm and dry.  Psychiatric: Her behavior is normal.    Data Reviewed 08/24/2016 wound culture: Rare WBC, no growth in 5 days. No organisms seen.  Assessment    Resolving seroma right post wide excision.    Plan    Neosporin to the area as needed Follow up in 2 weeks. Appointment with Dr Donella Stade in 3 weeks  HPI, Physical Exam, Assessment and Plan have been scribed under the direction and in the presence of Robert Bellow, MD. Karie Fetch, RN  I have completed the exam and reviewed the above documentation for accuracy and completeness.  I agree with the above.  Haematologist has been used and any errors in dictation or transcription are unintentional.  Hervey Ard, M.D., F.A.C.S.  Robert Bellow 09/07/2016, 9:37 PM  Patient has been scheduled for an appointment with Dr. Noreene Filbert at the [facility] for 09-28-16 at 9:30 am (arrive 9:15 am). She is aware of date, time, and instructions.   Dominga Ferry, CMA

## 2016-09-07 NOTE — Patient Instructions (Signed)
Neosporin to the area as needed

## 2016-09-08 ENCOUNTER — Ambulatory Visit (INDEPENDENT_AMBULATORY_CARE_PROVIDER_SITE_OTHER): Payer: 59 | Admitting: General Surgery

## 2016-09-08 ENCOUNTER — Encounter: Payer: Self-pay | Admitting: General Surgery

## 2016-09-08 VITALS — BP 130/74 | HR 76 | Resp 12 | Ht 60.0 in | Wt 249.0 lb

## 2016-09-08 DIAGNOSIS — C50411 Malignant neoplasm of upper-outer quadrant of right female breast: Secondary | ICD-10-CM

## 2016-09-08 DIAGNOSIS — Z170 Estrogen receptor positive status [ER+]: Secondary | ICD-10-CM

## 2016-09-08 NOTE — Progress Notes (Signed)
Patient ID: Tammy Turner, female   DOB: Aug 12, 1960, 56 y.o.   MRN: [id number]  Chief Complaint  Patient presents with  . Follow-up    HPI Tammy Turner is a 56 y.o. female. Here today for right breast Evaluation. She reports being awakened at 2 AM with pain in the lower inner aspect of the right breast and appreciating some faint redness and perhaps prominence of the dermal pores since that time.     HPI  Past Medical History:  Diagnosis Date  . Allergic state   . Anemia   . Breast cancer of upper-outer quadrant of right female breast ([place]) 06/2016   ER 95%, PR 90%, HER-2/neu not overexpressed.  . Complication of anesthesia    SEVERE MUSCLE ACHES FROM DIAPHRAGM TO THIGHS X 2 DAYS AFTER PARTIAL MASTECTOMY ON 07-25-16-PT WAS GIVEN SUCCINYLCHOLINE FOR INDUCTION  . Family history of ovarian cancer    Pt is My Risk cancer genetic testing neg 2016  . Genetic testing 2016   My Risk negative  . Gross hematuria   . Headache(784.0)   . Hyperlipidemia   . Hypertension   . Hypothyroidism   . Migraines   . Morbid obesity ([name])   . Osteopenia   . Osteopenia   . Pernicious anemia   . Sleep apnea 2010   uses CPAP    Past Surgical History:  Procedure Laterality Date  . ABDOMINAL HYSTERECTOMY  2011   [place]; Dr. Laurey Morale, due to leio/adenomyosis  . BREAST LUMPECTOMY Right 08/08/2016   Procedure: BREAST RE-EXCISION;  Surgeon: Robert Bellow, MD;  Location: [facility] ORS;  Service: General;  Laterality: Right;  . CESAREAN SECTION     x 2  . CHOLECYSTECTOMY    . COLONOSCOPY    . COLONOSCOPY WITH PROPOFOL N/A 10/05/2015   Procedure: COLONOSCOPY WITH PROPOFOL;  Surgeon: Manya Silvas, MD;  Location: [facility] ENDOSCOPY;  Service: Endoscopy;  Laterality: N/A;  . DILATION AND CURETTAGE OF UTERUS    . FUNCTIONAL ENDOSCOPIC SINUS SURGERY    . PARTIAL MASTECTOMY WITH AXILLARY SENTINEL LYMPH NODE BIOPSY Right 07/25/2016   Procedure: PARTIAL MASTECTOMY WITH AXILLARY SENTINEL LYMPH NODE BIOPSY;  Surgeon:  Robert Bellow, MD;  Location: [facility] ORS;  Service: General;  Laterality: Right;    Family History  Problem Relation Age of Onset  . Pulmonary embolism Mother   . Hypertension Mother   . Hyperlipidemia Mother   . Hypertension Father   . Ovarian cancer Maternal Grandmother        27  . Colon cancer Paternal Grandmother        54  . Breast cancer Neg Hx     Social History Social History  Substance Use Topics  . Smoking status: Never Smoker  . Smokeless tobacco: Never Used  . Alcohol use No    Allergies  Allergen Reactions  . Morphine Other (See Comments)    Depressed respirations  . Niacin Anaphylaxis  . Doxycycline Hives  . Iodine Hives and Itching  . Ivp Dye [Iodinated Diagnostic Agents] Hives and Itching  . Levaquin [Levofloxacin In D5w] Other (See Comments)    Tendonitis   . Cephalexin Rash  . Penicillins Hives and Rash    Has patient had a PCN reaction causing immediate rash, facial/tongue/throat swelling, SOB or lightheadedness with hypotension: No Has patient had a PCN reaction causing severe rash involving mucus membranes or skin necrosis: No Has patient had a PCN reaction that required hospitalization: No Has patient had a PCN reaction occurring within  the last 10 years: Yes If all of the above answers are "NO", then may proceed with Cephalosporin use.     Current Outpatient Prescriptions  Medication Sig Dispense Refill  . ASCORBIC ACID PO Take 2 tablets by mouth every evening.    . Carboxymethylcellulose Sodium (THERATEARS OP) Place 1 drop into both eyes 2 (two) times daily as needed (dry eyes).    . cholecalciferol (VITAMIN D) 1000 units tablet Take 1,000 Units by mouth every evening.    . Cyanocobalamin (VITAMIN B-12 IJ) Inject 1,000 mcg as directed every 30 (thirty) days.    . Flaxseed, Linseed, (FLAX SEEDS PO) Take 1 capsule by mouth every evening.     . fluticasone (FLONASE) 50 MCG/ACT nasal spray Place 2 sprays into both nostrils daily.    Marland Kitchen  levothyroxine (SYNTHROID, LEVOTHROID) 175 MCG tablet TAKE 1 TABLET BY MOUTH ONCE DAILY ON EMPTY STOMACH WITH GLASS OF WATER 30-60 MIN BEFORE BREAKFAST  1   No current facility-administered medications for this visit.     Review of Systems Review of Systems  Constitutional: Negative.   Respiratory: Negative.   Cardiovascular: Negative.     Blood pressure 130/74, pulse 76, resp. rate 12, height 5' (1.524 m), weight 249 lb (112.9 kg).  Physical Exam Physical Exam  Pulmonary/Chest:      Data Reviewed Limited ultrasound, no images, no charge showed no evidence of seroma formation in the upper-outer quadrant of the breast and the wide excision site. Modest edema of the skin in the lower inner quadrant likely secondary to lymphatic disruption.   Assessment    Stable breast.    Plan    With her marked allergies to antibiotics, and no clear indication for intervention at this time, we'll make use of Tylenol/Aleve/Advil and local heat. Patient was given my cell phone number to call should she have problems over the weekend.    Patient to return as scheduled. The patient is aware to use a heating pad as needed for comfort. The patient is aware to call back for any questions or concerns.   HPI, Physical Exam, Assessment and Plan have been scribed under the direction and in the presence of Hervey Ard, MD.  Gaspar Cola, CMA  I have completed the exam and reviewed the above documentation for accuracy and completeness.  I agree with the above.  Haematologist has been used and any errors in dictation or transcription are unintentional.  Hervey Ard, M.D., F.A.C.S.  Robert Bellow 09/08/2016, 3:40 PM

## 2016-09-08 NOTE — Patient Instructions (Signed)
  Patient to return as scheduled. The patient is aware to use a heating pad as needed for comfort. The patient is aware to call back for any questions or concerns.

## 2016-09-14 DIAGNOSIS — M2550 Pain in unspecified joint: Secondary | ICD-10-CM | POA: Diagnosis not present

## 2016-09-16 DIAGNOSIS — C50912 Malignant neoplasm of unspecified site of left female breast: Secondary | ICD-10-CM | POA: Diagnosis not present

## 2016-09-16 DIAGNOSIS — I10 Essential (primary) hypertension: Secondary | ICD-10-CM | POA: Diagnosis not present

## 2016-09-16 DIAGNOSIS — E039 Hypothyroidism, unspecified: Secondary | ICD-10-CM | POA: Diagnosis not present

## 2016-09-16 DIAGNOSIS — E538 Deficiency of other specified B group vitamins: Secondary | ICD-10-CM | POA: Diagnosis not present

## 2016-09-18 DIAGNOSIS — R531 Weakness: Secondary | ICD-10-CM | POA: Diagnosis not present

## 2016-09-18 DIAGNOSIS — R079 Chest pain, unspecified: Secondary | ICD-10-CM | POA: Diagnosis not present

## 2016-09-18 DIAGNOSIS — N390 Urinary tract infection, site not specified: Secondary | ICD-10-CM | POA: Diagnosis not present

## 2016-09-18 DIAGNOSIS — E039 Hypothyroidism, unspecified: Secondary | ICD-10-CM | POA: Diagnosis not present

## 2016-09-18 DIAGNOSIS — R55 Syncope and collapse: Secondary | ICD-10-CM | POA: Diagnosis not present

## 2016-09-18 DIAGNOSIS — R404 Transient alteration of awareness: Secondary | ICD-10-CM | POA: Diagnosis not present

## 2016-09-18 DIAGNOSIS — I4581 Long QT syndrome: Secondary | ICD-10-CM | POA: Diagnosis not present

## 2016-09-18 DIAGNOSIS — Z853 Personal history of malignant neoplasm of breast: Secondary | ICD-10-CM | POA: Diagnosis not present

## 2016-09-19 DIAGNOSIS — R55 Syncope and collapse: Secondary | ICD-10-CM | POA: Insufficient documentation

## 2016-09-19 DIAGNOSIS — R0602 Shortness of breath: Secondary | ICD-10-CM | POA: Diagnosis not present

## 2016-09-21 ENCOUNTER — Ambulatory Visit (INDEPENDENT_AMBULATORY_CARE_PROVIDER_SITE_OTHER): Payer: 59 | Admitting: General Surgery

## 2016-09-21 ENCOUNTER — Other Ambulatory Visit: Payer: Self-pay | Admitting: *Deleted

## 2016-09-21 ENCOUNTER — Encounter: Payer: Self-pay | Admitting: General Surgery

## 2016-09-21 VITALS — BP 142/74 | HR 80 | Resp 12 | Ht 60.0 in | Wt 245.0 lb

## 2016-09-21 DIAGNOSIS — Z170 Estrogen receptor positive status [ER+]: Secondary | ICD-10-CM

## 2016-09-21 DIAGNOSIS — R011 Cardiac murmur, unspecified: Secondary | ICD-10-CM

## 2016-09-21 DIAGNOSIS — M791 Myalgia, unspecified site: Secondary | ICD-10-CM

## 2016-09-21 DIAGNOSIS — C50411 Malignant neoplasm of upper-outer quadrant of right female breast: Secondary | ICD-10-CM

## 2016-09-21 DIAGNOSIS — M2550 Pain in unspecified joint: Secondary | ICD-10-CM

## 2016-09-21 NOTE — Progress Notes (Signed)
Patient ID: Tammy Turner, female   DOB: 1960/09/27, 56 y.o.   MRN: [id number]  Chief Complaint  Patient presents with  . Breast Cancer    HPI Tammy Turner is a 56 y.o. female here today for her two week follow up breast cancer. Patient states she went to the ER in [facility]. on Sunday because her heart was racing. She passed out. Patient is see  Christopher R, Berge, NP on Friday. The patient reports today a 2 week history of diffuse myalgias and arthralgias for which she was seen at the walk-in clinic and placed on a prednisone taper. No previous arthritis symptoms. She is completed the taper now has had recurrent of her symptoms.  HPI  Past Medical History:  Diagnosis Date  . Allergic state   . Anemia   . Breast cancer of upper-outer quadrant of right female breast (HCC) 06/2016   ER 95%, PR 90%, HER-2/neu not overexpressed.  . Complication of anesthesia    SEVERE MUSCLE ACHES FROM DIAPHRAGM TO THIGHS X 2 DAYS AFTER PARTIAL MASTECTOMY ON 07-25-16-PT WAS GIVEN SUCCINYLCHOLINE FOR INDUCTION  . Family history of ovarian cancer    Pt is My Risk cancer genetic testing neg 2016  . Genetic testing 2016   My Risk negative  . Gross hematuria   . Headache(784.0)   . Hyperlipidemia   . Hypertension   . Hypothyroidism   . Migraines   . Morbid obesity (HCC)   . Osteopenia   . Osteopenia   . Pernicious anemia   . Sleep apnea 2010   uses CPAP    Past Surgical History:  Procedure Laterality Date  . ABDOMINAL HYSTERECTOMY  2011   [facility]; Dr. Rosenow, due to leio/adenomyosis  . BREAST LUMPECTOMY Right 08/08/2016   Procedure: BREAST RE-EXCISION;  Surgeon: Brenn Gatton W, MD;  Location: [facility] ORS;  Service: General;  Laterality: Right;  . CESAREAN SECTION     x 2  . CHOLECYSTECTOMY    . COLONOSCOPY    . COLONOSCOPY WITH PROPOFOL N/A 10/05/2015   Procedure: COLONOSCOPY WITH PROPOFOL;  Surgeon: Robert T Elliott, MD;  Location: [facility] ENDOSCOPY;  Service: Endoscopy;  Laterality: N/A;  .  DILATION AND CURETTAGE OF UTERUS    . FUNCTIONAL ENDOSCOPIC SINUS SURGERY    . PARTIAL MASTECTOMY WITH AXILLARY SENTINEL LYMPH NODE BIOPSY Right 07/25/2016   Procedure: PARTIAL MASTECTOMY WITH AXILLARY SENTINEL LYMPH NODE BIOPSY;  Surgeon: Naphtali Zywicki W, MD;  Location: [facility] ORS;  Service: General;  Laterality: Right;    Family History  Problem Relation Age of Onset  . Pulmonary embolism Mother   . Hypertension Mother   . Hyperlipidemia Mother   . Hypertension Father   . Ovarian cancer Maternal Grandmother        58  . Colon cancer Paternal Grandmother        72   . Breast cancer Neg Hx     Social History Social History  Substance Use Topics  . Smoking status: Never Smoker  . Smokeless tobacco: Never Used  . Alcohol use No    Allergies  Allergen Reactions  . Morphine Other (See Comments)    Depressed respirations  . Niacin Anaphylaxis  . Doxycycline Hives  . Iodine Hives and Itching  . Ivp Dye [Iodinated Diagnostic Agents] Hives and Itching  . Levaquin [Levofloxacin In D5w] Other (See Comments)    Tendonitis   . Cephalexin Rash  . Penicillins Hives and Rash    Has patient had a PCN reaction causing immediate  rash, facial/tongue/throat swelling, SOB or lightheadedness with hypotension: No Has patient had a PCN reaction causing severe rash involving mucus membranes or skin necrosis: No Has patient had a PCN reaction that required hospitalization: No Has patient had a PCN reaction occurring within the last 10 years: Yes If all of the above answers are "NO", then may proceed with Cephalosporin use.     Current Outpatient Prescriptions  Medication Sig Dispense Refill  . ASCORBIC ACID PO Take 2 tablets by mouth every evening.    . Carboxymethylcellulose Sodium (THERATEARS OP) Place 1 drop into both eyes 2 (two) times daily as needed (dry eyes).    . cholecalciferol (VITAMIN D) 1000 units tablet Take 1,000 Units by mouth every evening.    . Cyanocobalamin (VITAMIN B-12  IJ) Inject 1,000 mcg as directed every 30 (thirty) days.    . Flaxseed, Linseed, (FLAX SEEDS PO) Take 1 capsule by mouth every evening.     . fluticasone (FLONASE) 50 MCG/ACT nasal spray Place 2 sprays into both nostrils daily.    Marland Kitchen levothyroxine (SYNTHROID, LEVOTHROID) 175 MCG tablet TAKE 1 TABLET BY MOUTH ONCE DAILY ON EMPTY STOMACH WITH GLASS OF WATER 30-60 MIN BEFORE BREAKFAST  1   No current facility-administered medications for this visit.     Review of Systems Review of Systems  Constitutional: Negative.   Respiratory: Negative.   Cardiovascular: Negative.     Blood pressure (!) 142/74, pulse 80, resp. rate 12, height 5' (1.524 m), weight 245 lb (111.1 kg).  Physical Exam Physical Exam  Constitutional: She is oriented to person, place, and time. She appears well-developed and well-nourished.  Eyes: Conjunctivae are normal. No scleral icterus.  Neck: Neck supple.  Cardiovascular: Normal rate and regular rhythm.   Murmur heard.  Systolic murmur is present with a grade of 2/6  New murmur across the aortic valve not appreciated time of her May 2018 initial exam.  Rare premature beat during 1 minute auscultation.  Pulmonary/Chest: Effort normal and breath sounds normal. Right breast exhibits no inverted nipple, no mass, no nipple discharge, no skin change and no tenderness. Left breast exhibits no inverted nipple, no mass, no nipple discharge, no skin change and no tenderness.    Lymphadenopathy:    She has no cervical adenopathy.    She has no axillary adenopathy.  Neurological: She is alert and oriented to person, place, and time.  Skin: Skin is warm and dry.    Data Reviewed 09/18/2016 evaluation through the [facility] for syncope. Carotid ultrasound report is normal. Troponins negative.  Outpatient referral to cardiology for further evaluation.  Laboratory studies dated 09/14/2016 completed through the walk-in clinic showed an elevated C-reactive protein at 24.3.  Lyme disease testing negative. RA late extubated he less than 10.  Treated with oral prednisone taper which the patient was taking at the time of her cardiac/syncopal spell.  Focused ultrasound examination of the wide excision site shows no discernible cavity for balloon placement and thickening of the subcutaneous tissue consistent with a resolving inflammatory changes. Unlikely candidate for accelerated partial breast radiation unless strongly desired by radiation oncology.  Assessment    Resolution of infectious process involving the right breast.  Several week history of diffuse myalgias and arthralgias which responded to prednisone.  Syncopal spell, unclear source. Patient described appreciating irregular heartbeat just before she lost consciousness. No past cardiac events.  Newly appreciated aortic murmur.    Plan    The patient has a scheduled follow-up with Murray Hodgkins with  cardiology later this week.  Appointment with Dr. Baruch Gouty next week.  With diffuse myalgias, arthralgias have recommended assessment with rheumatology. We'll work to schedule an appointment with Dr. Jefm Bryant or his associates.    Return in three months. Patient to see a rheumatologist.   HPI, Physical Exam, Assessment and Plan have been scribed under the direction and in the presence of Hervey Ard, MD.  Gaspar Cola, CMA  I have completed the exam and reviewed the above documentation for accuracy and completeness.  I agree with the above.  Haematologist has been used and any errors in dictation or transcription are unintentional.  Hervey Ard, M.D., F.A.C.S.    Robert Bellow 09/21/2016, 8:47 AM

## 2016-09-21 NOTE — Patient Instructions (Signed)
Return in three months. Patient to see a rheumatologist.

## 2016-09-23 ENCOUNTER — Ambulatory Visit (INDEPENDENT_AMBULATORY_CARE_PROVIDER_SITE_OTHER): Payer: 59 | Admitting: Nurse Practitioner

## 2016-09-23 ENCOUNTER — Encounter: Payer: Self-pay | Admitting: Nurse Practitioner

## 2016-09-23 VITALS — BP 130/72 | HR 74 | Ht 60.0 in | Wt 242.2 lb

## 2016-09-23 DIAGNOSIS — R002 Palpitations: Secondary | ICD-10-CM | POA: Diagnosis not present

## 2016-09-23 DIAGNOSIS — R55 Syncope and collapse: Secondary | ICD-10-CM | POA: Diagnosis not present

## 2016-09-23 DIAGNOSIS — I10 Essential (primary) hypertension: Secondary | ICD-10-CM

## 2016-09-23 NOTE — Patient Instructions (Addendum)
Medication Instructions:  Your physician recommends that you continue on your current medications as directed. Please refer to the Current Medication list given to you today.   Labwork: none  Testing/Procedures: Your physician has requested that you have an echocardiogram. Echocardiography is a painless test that uses sound waves to create images of your heart. It provides your doctor with information about the size and shape of your heart and how well your heart's chambers and valves are working. This procedure takes approximately one hour. There are no restrictions for this procedure.  Your physician has recommended that you wear an event monitor. Event monitors are medical devices that record the heart's electrical activity. Doctors most often [place] these monitors to diagnose arrhythmias. Arrhythmias are problems with the speed or rhythm of the heartbeat. The monitor is a small, portable device. You can wear one while you do your normal daily activities. This is usually used to diagnose what is causing palpitations/syncope (passing out).    Follow-Up: Your physician recommends that you schedule a follow-up appointment in: 6 weeks with Dr. Fletcher Anon.    Any Other Special Instructions Will Be Listed Below (If Applicable).     If you need a refill on your cardiac medications before your next appointment, please call your pharmacy.  Echocardiogram An echocardiogram, or echocardiography, uses sound waves (ultrasound) to produce an image of your heart. The echocardiogram is simple, painless, obtained within a short period of time, and offers valuable information to your health care provider. The images from an echocardiogram can provide information such as:  Evidence of coronary artery disease (CAD).  Heart size.  Heart muscle function.  Heart valve function.  Aneurysm detection.  Evidence of a past heart attack.  Fluid buildup around the heart.  Heart muscle thickening.  Assess  heart valve function.  Tell a health care provider about:  Any allergies you have.  All medicines you are taking, including vitamins, herbs, eye drops, creams, and over-the-counter medicines.  Any problems you or family members have had with anesthetic medicines.  Any blood disorders you have.  Any surgeries you have had.  Any medical conditions you have.  Whether you are pregnant or may be pregnant. What happens before the procedure? No special preparation is needed. Eat and drink normally. What happens during the procedure?  In order to produce an image of your heart, gel will be applied to your chest and a wand-like tool (transducer) will be moved over your chest. The gel will help transmit the sound waves from the transducer. The sound waves will harmlessly bounce off your heart to allow the heart images to be captured in real-time motion. These images will then be recorded.  You may need an IV to receive a medicine that improves the quality of the pictures. What happens after the procedure? You may return to your normal schedule including diet, activities, and medicines, unless your health care provider tells you otherwise. This information is not intended to replace advice given to you by your health care provider. Make sure you discuss any questions you have with your health care provider. Document Released: 01/22/2000 Document Revised: 09/12/2015 Document Reviewed: 10/01/2012 Elsevier Interactive Patient Education  2017 [place] [employer].  Cardiac Event Monitoring A cardiac event monitor is a small recording device that is used to detect abnormal heart rhythms (arrhythmias). The monitor is used to record your heart rhythm when you have symptoms, such as:  Fast heartbeats (palpitations), such as heart racing or fluttering.  Dizziness.  Fainting or  light-headedness.  Unexplained weakness.  Some monitors are wired to electrodes placed on your chest. Electrodes are flat,  sticky disks that attach to your skin. Other monitors may be hand-held or worn on the wrist. The monitor can be worn for up to 30 days. If the monitor is attached to your chest, a technician will prepare your chest for the electrode placement and show you how to work the monitor. Take time to practice using the monitor before you leave the office. Make sure you understand how to send the information from the monitor to your health care provider. In some cases, you may need to use a landline telephone instead of a cell phone. What are the risks? Generally, this device is safe to use, but it possible that the skin under the electrodes will become irritated. How to use your cardiac event monitor  Wear your monitor at all times, except when you are in water: ? Do not let the monitor get wet. ? Take the monitor off when you bathe. Do not swim or use a hot tub with it on.  Keep your skin clean. Do not put body lotion or moisturizer on your chest.  Change the electrodes as told by your health care provider or any time they stop sticking to your skin. You may need to use medical tape to keep them on.  Try to put the electrodes in slightly different places on your chest to help prevent skin irritation. They must remain in the area under your left breast and in the upper right section of your chest.  Make sure the monitor is safely clipped to your clothing or in a location close to your body that your health care provider recommends.  Press the button to record as soon as you feel heart-related symptoms, such as: ? Dizziness. ? Weakness. ? Light-headedness. ? Palpitations. ? Thumping or pounding in your chest. ? Shortness of breath. ? Unexplained weakness.  Keep a diary of your activities, such as walking, doing chores, and taking medicine. It is very important to note what you were doing when you pushed the button to record your symptoms. This will help your health care provider determine what  might be contributing to your symptoms.  Send the recorded information as recommended by your health care provider. It may take some time for your health care provider to process the results.  Change the batteries as told by your health care provider.  Keep electronic devices away from your monitor. This includes: ? Tablets. ? MP3 players. ? Cell phones.  While wearing your monitor you should avoid: ? Electric blankets. ? Armed forces operational officer. ? Electric toothbrushes. ? Microwave ovens. ? Magnets. ? Metal detectors. Get help right away if:  You have chest pain.  You have extreme difficulty breathing or shortness of breath.  You develop a very fast heartbeat that persists.  You develop dizziness that does not go away.  You faint or constantly feel like you are about to faint. Summary  A cardiac event monitor is a small recording device that is used to help detect abnormal heart rhythms (arrhythmias).  The monitor is used to record your heart rhythm when you have heart-related symptoms.  Make sure you understand how to send the information from the monitor to your health care provider.  It is important to press the button on the monitor when you have any heart-related symptoms.  Keep a diary of your activities, such as walking, doing chores, and taking medicine. It is  very important to note what you were doing when you pushed the button to record your symptoms. This will help your health care provider learn what might be causing your symptoms. This information is not intended to replace advice given to you by your health care provider. Make sure you discuss any questions you have with your health care provider. Document Released: 11/03/2007 Document Revised: 01/09/2016 Document Reviewed: 01/09/2016 Elsevier Interactive Patient Education  2017 [employer].

## 2016-09-23 NOTE — Progress Notes (Signed)
Office Visit    Patient Name: Tammy Turner Date of Encounter: 09/23/2016  Primary Care Provider:  Juluis Pitch, MD Primary Cardiologist:  Jerilynn Mages. Fletcher Anon, MD   Chief Complaint    56 year old female with a history of hypertension, hyperlipidemia, hypothyroidism, pernicious anemia, obesity, sleep apnea, and recent diagnosis of right breast cancer in May 2018, who presents for follow-up after recent hospital evaluation for syncope.  Past Medical History    Past Medical History:  Diagnosis Date  . Allergic state   . Anemia   . Breast cancer of upper-outer quadrant of right female breast ([place]) 06/2016   ER 95%, PR 90%, HER-2/neu not overexpressed.  . Complication of anesthesia    SEVERE MUSCLE ACHES FROM DIAPHRAGM TO THIGHS X 2 DAYS AFTER PARTIAL MASTECTOMY ON 07-25-16-PT WAS GIVEN SUCCINYLCHOLINE FOR INDUCTION  . Dyspnea on exertion    a. 09/2013 Echo: EF 60-65%, no rwma;  b. 11/2013 Myoview: EF 64%, no ischemia.  . Family history of ovarian cancer    Pt is My Risk cancer genetic testing neg 2016  . Genetic testing 2016   My Risk negative  . Gross hematuria   . Headache(784.0)   . Hyperlipidemia   . Hypertension   . Hypothyroidism   . Migraines   . Morbid obesity ([place])   . Osteopenia   . Osteopenia   . Pernicious anemia   . Sleep apnea 2010   uses CPAP  . Syncope    a. 09/2014 - seen in ED - limited w/u unrevealing.   Past Surgical History:  Procedure Laterality Date  . ABDOMINAL HYSTERECTOMY  2011   [place]; Dr. Laurey Morale, due to leio/adenomyosis  . BREAST LUMPECTOMY Right 08/08/2016   Procedure: BREAST RE-EXCISION;  Surgeon: Robert Bellow, MD;  Location: [facility] ORS;  Service: General;  Laterality: Right;  . CESAREAN SECTION     x 2  . CHOLECYSTECTOMY    . COLONOSCOPY    . COLONOSCOPY WITH PROPOFOL N/A 10/05/2015   Procedure: COLONOSCOPY WITH PROPOFOL;  Surgeon: Manya Silvas, MD;  [facility] END[facility];  Service: Endoscopy;  Laterality: N/A;  . DILATION AND  CURETTAGE OF UTERUS    . FUNCTIONAL ENDOSCOPIC SINUS SURGERY    . PARTIAL MASTECTOMY WITH AXILLARY SENTINEL LYMPH NODE BIOPSY Right 07/25/2016   Procedure: PARTIAL MASTECTOMY WITH AXILLARY SENTINEL LYMPH NODE BIOPSY;  Surgeon: Robert Bellow, MD;  Location: [facility] ORS;  Service: General;  Laterality: Right;    Allergies  Allergies  Allergen Reactions  . Morphine Other (See Comments)    Depressed respirations  . Niacin Anaphylaxis  . Doxycycline Hives  . Iodine Hives and Itching  . Ivp Dye [Iodinated Diagnostic Agents] Hives and Itching  . Levaquin [Levofloxacin In D5w] Other (See Comments)    Tendonitis   . Cephalexin Rash  . Penicillins Hives and Rash    Has patient had a PCN reaction causing immediate rash, facial/tongue/throat swelling, SOB or lightheadedness with hypotension: No Has patient had a PCN reaction causing severe rash involving mucus membranes or skin necrosis: No Has patient had a PCN reaction that required hospitalization: No Has patient had a PCN reaction occurring within the last 10 years: Yes If all of the above answers are "NO", then may proceed with Cephalosporin use.     History of Present Illness    56 year old female with the above past medical history including  hypertension, hyperlipidemia, hypothyroidism, pernicious anemia, obesity, and sleep apnea. She was last evaluated in 2016 by Dr. Fletcher Anon. In  2015, she underwent echocardiogram and stress testing in the setting of dyspnea on exertion. Both were within normal limits. Earlier this year, she was diagnosed with right-sided breast cancer and has been followed by oncology and also general surgery. She is status post partial right mastectomy with axillary sentinel lymph node biopsy in June of this year.  Postop course was complicated by seroma requiring antibiotics and drainage.  She says that since her diagnosis of breast cancer, she has dramatically changed her diet. She is eating more whole foods and  vegetables and in that setting, has lost some weight.  She is actually down about 30 pounds since 2016.  In the setting of weight loss, she has had improved blood pressure controls and is no longer taking losartan-HCTZ. She says that despite her diagnosis of right-sided breast cancer with surgery and pending evaluation for radiation, she had been feeling exceptionally well. On August 12 however, she was eating out with multiple family members at [facility].  It was very hot in the restaurant as one of the air conditioners was not working. She had just started eating her salad and so she didn't have much of an appetite. She was conversing with her son when she had sudden onset of dyspnea followed by tunnel vision and lightheadedness. She lost consciousness abruptly and she has been told that she was falling forward toward the table but that her son caught her. Family assisted her to the floor. One of the family members is a Marine scientist and reportedly could not find a pulse. Patient was reportedly not breathing.  EMS was called.  Apparently patient did become responsive within a short period of time of being on the floor but was then in and out of consciousness and very groggy. CPR was not required at any point. Following EMS arrival, she was taken to a [facility] ER.  Labs are outlined below and we normal. She was observed overnight without incident and subsequently discharged home. She was offered cardiac evaluation but she deferred.  Since her discharge, she has done well without recurrent syncope.  She denies chest pain or dyspnea. She does note increased recurrence of hot flashes since her breast surgery and in that setting, she has also had tachypalpitations occurring a few times a day, lasting just a few seconds, and resolving spontaneously.  Palpitations are associated with brief episodes of flushing in the setting of hot flashes.  She denies PND, orthopnea, dizziness, syncope, edema, or early  satiety.  Home Medications    Prior to Admission medications   Medication Sig Start Date End Date Taking? Authorizing Provider  thyroid (ARMOUR) 90 MG tablet Take 90 mg by mouth daily.   Yes [provider]  ASCORBIC ACID PO Take 2 tablets by mouth every evening.    [provider]  Carboxymethylcellulose Sodium (THERATEARS OP) Place 1 drop into both eyes 2 (two) times daily as needed (dry eyes).    [provider]  cholecalciferol (VITAMIN D) 1000 units tablet Take 1,000 Units by mouth every evening.    [provider]  Cyanocobalamin (VITAMIN B-12 IJ) Inject 1,000 mcg as directed every 30 (thirty) days.    [provider]  fluticasone (FLONASE) 50 MCG/ACT nasal spray Place 2 sprays into both nostrils daily.    [provider]    Review of Systems    Syncope on 8/12 with brief periods of tachypalpitations in the setting of hot flashes since.  She denies chest pain, dyspnea, pnd, orthopnea, n, v,  edema, weight gain, or early satiety.  All other systems reviewed and are otherwise negative except as noted above.  Physical Exam    VS:  BP 130/72 (BP Location: Left Arm, Patient Position: Sitting, Cuff Size: Large)   Pulse 74   Ht 5' (1.524 m)   Wt 242 lb 4 oz (109.9 kg)   BMI 47.31 kg/m  , BMI Body mass index is 47.31 kg/m. GEN: Well nourished, well developed, in no acute distress.  HEENT: normal.  Neck: Supple, no JVD, carotid bruits, or masses. Cardiac: RRR, 1/6 syst murmur @ RUSB, no rubs, or gallops. No clubbing, cyanosis, edema.  Radials/DP/PT 2+ and equal bilaterally.  Respiratory:  Respirations regular and unlabored, clear to auscultation bilaterally. GI: Soft, nontender, nondistended, BS + x 4. MS: no deformity or atrophy. Skin: warm and dry, no rash. Neuro:  Strength and sensation are intact. Psych: Normal affect.  Accessory Clinical Findings    ECG - regular sinus rhythm, 74, incomplete right bundle branch block,  normal intervals. No acute changes.  Labs from the [facility] visit 09/18/2016:  Hemoglobin 13.7, hematocrit 40.2, WBC 12.8, platelet 275. Sodium 136, Potassium 3.3, chloride 98, CO2 24, BUN 21, creatinine 0.57, calcium 9.2 AST 14, ALT 19, alkaline phosphatase 80, albumin 3.9 Troponin less than 0.10 NT-ProBNP 11  Mg 2.0    _____________   Result Date: 09/18/2016 HEAD CT WITHOUT INJECTED CONTRAST TECHNIQUE: Axial scans were obtained through the brain without contrast. CT dose reduction techniques were utilized. PROVIDED CLINICAL INFORMATION: Syncope/fainting ADDITIONAL CLINICAL INFORMATION: None available COMPARISON: None available. FINDINGS: There is no evidence of mass, acute large territory infarct or hemorrhage. The ventricles are normal in size. Evidence of prior maxillary antrostomies, and bilateral ethmoidectomies.. The sinuses are clear. The orbits are unremarkable. Skull is intact with no identifiable fracture. _____________   Result Date: 09/18/2016 SINGLE CHEST RADIOGRAPH: PROVIDED CLINICAL INFORMATION: Weakness ADDITIONAL CLINICAL INFORMATION: None available COMPARISON: None available FINDINGS: The cardiac silhouette is unremarkable. Pulmonary vasculature is normal. There is no evidence of pneumothorax or pleural effusion. The lungs are clear without evidence of focal consolidation. The osseous structures are age-appropriate without acute abnormality.   IMPRESSION: No evidence of active disease. Electronically Signed by: Desiree Lucy _____________   Assessment & Plan    1.  Syncope: Patient recently experienced a syncopal spell while sitting in a restaurant. She had been eating some but was not eating at the time of the onset of symptoms. She denies any choking or coughing.  It was hot in the restaurant, as the air conditioner was broken. Symptoms appear to occur rapidly and included acute dyspnea followed by lightheadedness and tunnel vision. There was a report of  pulselessness by a nurse who was there and attended to her. CPR was not required however. Workup @ a [employer] was unrevealing. She has not had any recurrent presyncope or syncope but has had palpitations in the setting of hot flashes. She has previously normal LV function. She is not orthostatic on exam.  Symptoms are not clearly vasovagal and I am concerned about the possibility of tachy vs brady arrhythmia.  I will follow up an echocardiogram and also place a 30 day event monitor to assess for further arrhythmia.  2. Palpitations: As above, patient has been experiencing daily tachypalpitations, associated with flushing/hot flashes, lasting just a few seconds, and resolving spontaneously. Labs from recent hospitalization reviewed. I am placing a 30 day event monitor and will repeat an echo.  3. History of hypertension:  Patient previously required antihypertensive therapy but has lost weight over the past 2 years and was noticing hypotension and therefore came off of her losartan-HCTZ.  4. Right breast cancer:  She has been following up with oncology, surgery, and is scheduled to follow-up with radiation oncology.  5. Morbid obesity: Patient has lost significant weight over the past few years and since her cancer diagnosis, has been trying to eat a more whole foods type diet. I encouraged her to continue to focus on calorie restriction and increased activity as tolerated.  6. Dispo:  F/u echo and 30 day event monitor.  F/u with M. Arida, MD in 4-6 wks or sooner if necessary.   Murray Hodgkins, NP 09/23/2016, 10:41 AM

## 2016-09-26 ENCOUNTER — Encounter: Payer: Self-pay | Admitting: *Deleted

## 2016-09-26 ENCOUNTER — Telehealth: Payer: Self-pay | Admitting: *Deleted

## 2016-09-26 NOTE — Telephone Encounter (Signed)
Message left for patient to call the office.   Per [facility], Dr. Precious Reel is not accepting new patients.   Patient has been scheduled for an appointment with Dr. Uvaldo Bristle for 10-05-16 at 8:30 am (arrive 8:15 am-first floor check-in).

## 2016-09-26 NOTE — Telephone Encounter (Signed)
Patient called the office back and notified about upcoming appointment with rheumatology.   This patient states that she is not happy not being able to see Dr. Jefm Bryant but states she will keep appointment with Dr. Meda Coffee and see how it goes.

## 2016-09-26 NOTE — Progress Notes (Signed)
Dr. Bary Castilla changed patient's appointment with rheumatology. She will be seeing Dr. Precious Reel on 10-05-16 at 10 am (arrive 9:45 am). Patient aware.

## 2016-09-28 ENCOUNTER — Ambulatory Visit
Admission: RE | Admit: 2016-09-28 | Discharge: 2016-09-28 | Disposition: A | Discharge status: Home / Self Care | Payer: 59 | Source: Ambulatory Visit | Attending: Radiation Oncology | Admitting: Radiation Oncology

## 2016-09-28 VITALS — BP 143/85 | HR 90 | Temp 98.0°F | Resp 20 | Wt 242.7 lb

## 2016-09-28 DIAGNOSIS — Z79899 Other long term (current) drug therapy: Secondary | ICD-10-CM | POA: Diagnosis not present

## 2016-09-28 DIAGNOSIS — Z510 Encounter for antineoplastic radiation therapy: Secondary | ICD-10-CM | POA: Diagnosis not present

## 2016-09-28 DIAGNOSIS — E669 Obesity, unspecified: Secondary | ICD-10-CM | POA: Insufficient documentation

## 2016-09-28 DIAGNOSIS — R51 Headache: Secondary | ICD-10-CM | POA: Diagnosis not present

## 2016-09-28 DIAGNOSIS — D72829 Elevated white blood cell count, unspecified: Secondary | ICD-10-CM | POA: Diagnosis not present

## 2016-09-28 DIAGNOSIS — D649 Anemia, unspecified: Secondary | ICD-10-CM | POA: Insufficient documentation

## 2016-09-28 DIAGNOSIS — I472 Ventricular tachycardia: Secondary | ICD-10-CM | POA: Insufficient documentation

## 2016-09-28 DIAGNOSIS — Z8669 Personal history of other diseases of the nervous system and sense organs: Secondary | ICD-10-CM | POA: Insufficient documentation

## 2016-09-28 DIAGNOSIS — D510 Vitamin B12 deficiency anemia due to intrinsic factor deficiency: Secondary | ICD-10-CM | POA: Diagnosis not present

## 2016-09-28 DIAGNOSIS — Z800 Family history of malignant neoplasm of digestive organs: Secondary | ICD-10-CM | POA: Diagnosis not present

## 2016-09-28 DIAGNOSIS — M8580 Other specified disorders of bone density and structure, unspecified site: Secondary | ICD-10-CM | POA: Diagnosis not present

## 2016-09-28 DIAGNOSIS — Z170 Estrogen receptor positive status [ER+]: Secondary | ICD-10-CM | POA: Diagnosis not present

## 2016-09-28 DIAGNOSIS — E785 Hyperlipidemia, unspecified: Secondary | ICD-10-CM | POA: Diagnosis not present

## 2016-09-28 DIAGNOSIS — E039 Hypothyroidism, unspecified: Secondary | ICD-10-CM | POA: Insufficient documentation

## 2016-09-28 DIAGNOSIS — I10 Essential (primary) hypertension: Secondary | ICD-10-CM | POA: Diagnosis not present

## 2016-09-28 DIAGNOSIS — R319 Hematuria, unspecified: Secondary | ICD-10-CM | POA: Diagnosis not present

## 2016-09-28 DIAGNOSIS — Z8041 Family history of malignant neoplasm of ovary: Secondary | ICD-10-CM | POA: Diagnosis not present

## 2016-09-28 DIAGNOSIS — G4730 Sleep apnea, unspecified: Secondary | ICD-10-CM | POA: Insufficient documentation

## 2016-09-28 DIAGNOSIS — R0602 Shortness of breath: Secondary | ICD-10-CM | POA: Insufficient documentation

## 2016-09-28 DIAGNOSIS — R55 Syncope and collapse: Secondary | ICD-10-CM | POA: Insufficient documentation

## 2016-09-28 DIAGNOSIS — C50411 Malignant neoplasm of upper-outer quadrant of right female breast: Secondary | ICD-10-CM | POA: Diagnosis present

## 2016-09-28 DIAGNOSIS — R946 Abnormal results of thyroid function studies: Secondary | ICD-10-CM | POA: Diagnosis not present

## 2016-09-28 NOTE — Consult Note (Signed)
NEW PATIENT EVALUATION  Name: Tammy Turner  MRN: [id number]  Date:   09/28/2016     DOB: [birth year]   This 56 y.o. female patient presents to the clinic for initial evaluation of stage I (T1 be N0 M0) ER/PR positive HER-2/neu negative invasive mammary carcinoma of the right breast status post wide local excision and sentinel node biopsy.  REFERRING PHYSICIAN: Juluis Pitch, MD  CHIEF COMPLAINT:  Chief Complaint  Patient presents with  . Breast Cancer    Initial Evaluation    DIAGNOSIS: The encounter diagnosis was Malignant neoplasm of upper-outer quadrant of right breast in female, estrogen receptor positive ([place]).   PREVIOUS INVESTIGATIONS:  Mammogram ultrasound reviewed Pathology reports reviewed Clinical notes reviewed  HPI: Patient is a 56 year old female who presented with an abnormal mammogram of the right breast showing architectural distortion confirmed on ultrasound to be at the 10:00 position 7 cm from the nipple measuring approximately 0.6 cm in greatest dimension. Axillary ultrasound was normal. She underwent ultrasound-guided biopsy which was positive for grade 1 ER/PR positive HER-2/neu negative invasive mammary carcinoma. She went on to have a wide local excision showing a 1 cm invasive mammary carcinoma overall grade 1. 2 sentinel lymph nodes were negative. Tumor again was ER/PR positive HER-2/neu not overexpressed. There was no lymphovascular invasion there was involvement of the anterior margin. She underwent reexcision with no residual tumor seen. Since her surgery she's developed some diffuse my lesions and arthralgias for which she is seeing Janice Norrie nodal for evaluation. She also had some postoperative seroma infection treated with antibiotics successfully. She is doing better at this time although does complain of aches and pains. She also has what I perceive was ventricular tachycardia and she did pass out. She is scheduled to start wearing a cardiac monitor for  a month. She specifically denies breast tenderness cough at this time.  PLANNED TREATMENT REGIMEN:  Right whole breast radiatio  PAST MEDICAL HISTORY:  has a past medical history of Allergic state; Anemia; Breast cancer of upper-outer quadrant of right female breast ([place]) (06/2016); Complication of anesthesia; Dyspnea on exertion; Family history of ovarian cancer; Genetic testing (2016); Gross hematuria; Headache(784.0); Hyperlipidemia; Hypertension; Hypothyroidism; Migraines; Morbid obesity ([place]); Osteopenia; Osteopenia; Pernicious anemia; Sleep apnea (2010); and Syncope.    PAST SURGICAL HISTORY:  Past Surgical History:  Procedure Laterality Date  . ABDOMINAL HYSTERECTOMY  2011   [place]; Dr. Laurey Morale, due to leio/adenomyosis  . BREAST LUMPECTOMY Right 08/08/2016   Procedure: BREAST RE-EXCISION;  Surgeon: Robert Bellow, MD;  Location: [facility] ORS;  Service: General;  Laterality: Right;  . CESAREAN SECTION     x 2  . CHOLECYSTECTOMY    . COLONOSCOPY    . COLONOSCOPY WITH PROPOFOL N/A 10/05/2015   Procedure: COLONOSCOPY WITH PROPOFOL;  Surgeon: Manya Silvas, MD;  Location: [facility] ENDOSCOPY;  Service: Endoscopy;  Laterality: N/A;  . DILATION AND CURETTAGE OF UTERUS    . FUNCTIONAL ENDOSCOPIC SINUS SURGERY    . PARTIAL MASTECTOMY WITH AXILLARY SENTINEL LYMPH NODE BIOPSY Right 07/25/2016   Procedure: PARTIAL MASTECTOMY WITH AXILLARY SENTINEL LYMPH NODE BIOPSY;  Surgeon: Robert Bellow, MD;  Location: [facility] ORS;  Service: General;  Laterality: Right;    FAMILY HISTORY: family history includes Colon cancer in her paternal grandmother; Hyperlipidemia in her mother; Hypertension in her father and mother; Ovarian cancer in her maternal grandmother; Pulmonary embolism in her mother.  SOCIAL HISTORY:  reports that she has never smoked. She has never used smokeless tobacco. She reports  that she does not drink alcohol or use drugs.  ALLERGIES: Morphine; Niacin; Doxycycline; Iodine; Ivp dye [iodinated  diagnostic agents]; Levaquin [levofloxacin in d5w]; Cephalexin; and Penicillins  MEDICATIONS:  Current Outpatient Prescriptions  Medication Sig Dispense Refill  . losartan-hydrochlorothiazide (HYZAAR) 100-25 MG tablet TAKE 1 TABLET BY MOUTH EVERY DAY    . ASCORBIC ACID PO Take 2 tablets by mouth every evening.    . Carboxymethylcellulose Sodium (THERATEARS OP) Place 1 drop into both eyes 2 (two) times daily as needed (dry eyes).    . cholecalciferol (VITAMIN D) 1000 units tablet Take 1,000 Units by mouth every evening.    . Cyanocobalamin (VITAMIN B-12 IJ) Inject 1,000 mcg as directed every 30 (thirty) days.    . fluticasone (FLONASE) 50 MCG/ACT nasal spray Place 2 sprays into both nostrils daily.    Marland Kitchen thyroid (ARMOUR) 90 MG tablet Take 90 mg by mouth daily.     No current facility-administered medications for this encounter.     ECOG PERFORMANCE STATUS:  0 - Asymptomatic  REVIEW OF SYSTEMS: Except for the arthralgias and cardiac history as described above Patient denies any weight loss, fatigue, weakness, fever, chills or night sweats. Patient denies any loss of vision, blurred vision. Patient denies any ringing  of the ears or hearing loss. No irregular heartbeat. Patient denies heart murmur or history of fainting. Patient denies any chest pain or pain radiating to her upper extremities. Patient denies any shortness of breath, difficulty breathing at night, cough or hemoptysis. Patient denies any swelling in the lower legs. Patient denies any nausea vomiting, vomiting of blood, or coffee ground material in the vomitus. Patient denies any stomach pain. Patient states has had normal bowel movements no significant constipation or diarrhea. Patient denies any dysuria, hematuria or significant nocturia. Patient denies any problems walking, swelling in the joints or loss of balance. Patient denies any skin changes, loss of hair or loss of weight. Patient denies any excessive worrying or anxiety or  significant depression. Patient denies any problems with insomnia. Patient denies excessive thirst, polyuria, polydipsia. Patient denies any swollen glands, patient denies easy bruising or easy bleeding. Patient denies any recent infections, allergies or URI. Patient "s visual fields have not changed significantly in recent time.    PHYSICAL EXAM: BP (!) 143/85   Pulse 90   Temp 98 F (36.7 C)   Resp 20   Wt 242 lb 11.6 oz (110.1 kg)   BMI 47.40 kg/m  Well-developed obese female in NAD. Breast are large and pendulous. She is status post wide local excision of the right breast incision is healing well. No dominant mass or nodularity is noted in either breast in 2 positions examined. No axillary or supraclavicular adenopathy is appreciated. There seems to be no evidence of mastitis at this point. Well-developed well-nourished patient in NAD. HEENT reveals PERLA, EOMI, discs not visualized.  Oral cavity is clear. No oral mucosal lesions are identified. Neck is clear without evidence of cervical or supraclavicular adenopathy. Lungs are clear to A&P. Cardiac examination is essentially unremarkable with regular rate and rhythm without murmur rub or thrill. Abdomen is benign with no organomegaly or masses noted. Motor sensory and DTR levels are equal and symmetric in the upper and lower extremities. Cranial nerves II through XII are grossly intact. Proprioception is intact. No peripheral adenopathy or edema is identified. No motor or sensory levels are noted. Crude visual fields are within normal range.  LABORATORY DATA: Pathology reports reviewed    RADIOLOGY RESULTS:  Mammograms and ultrasound reviewed   IMPRESSION: Stage I invasive mammary carcinoma ER/PR positive HER-2/neu negative status post wide local excision in 56 year old female for whole breast radiation  PLAN: At this time I would recommend whole breast radiation. Her breasts are large and pendulous making hypofractionated course of  treatment problematic. I would plan on delivering 5040 cGy in 28 fractions and the first set up and ordered CT simulation. Would also boost her scar another 1400 cGy using electron beam. Risks and benefits of treatment including skin reaction which in her case based on large size of her breast may be problematic, fatigue alteration of blood counts possible inclusion of superficial lung all were discussed in detail with the patient. She seems to comprehend my treatment plan well. Patient also will be candidate for antiestrogen therapy after completion of radiation. I have personally discussed the case with medical oncology who does not feel based on the tumor size grade 1 and other comorbidities that she needs evaluation for systemic chemotherapy.  I would like to take this opportunity to thank you for allowing me to participate in the care of your patient.Armstead Peaks., MD

## 2016-09-29 ENCOUNTER — Ambulatory Visit (INDEPENDENT_AMBULATORY_CARE_PROVIDER_SITE_OTHER): Payer: 59

## 2016-09-29 DIAGNOSIS — R55 Syncope and collapse: Secondary | ICD-10-CM

## 2016-09-29 DIAGNOSIS — G4733 Obstructive sleep apnea (adult) (pediatric): Secondary | ICD-10-CM | POA: Diagnosis not present

## 2016-09-30 ENCOUNTER — Other Ambulatory Visit: Payer: Self-pay

## 2016-09-30 ENCOUNTER — Ambulatory Visit (INDEPENDENT_AMBULATORY_CARE_PROVIDER_SITE_OTHER): Payer: 59

## 2016-09-30 DIAGNOSIS — R55 Syncope and collapse: Secondary | ICD-10-CM

## 2016-09-30 LAB — ECHOCARDIOGRAM COMPLETE
AO mean calculated velocity dopler: 102 cm/s
AV Area VTI index: 1.24 cm2/m2
AV Area mean vel: 2.43 cm2
AV Mean grad: 5 mmHg
AV VEL mean LVOT/AV: 0.77
AV area mean vel ind: 1.2 cm2/m2
AV vel: 2.51
Ao-asc: 27 cm
Area-P 1/2: 4.07 cm2
E decel time: 183 ms
E/e' ratio: 10.67
FS: 26 % — AB (ref 28–44)
IVS/LV PW RATIO, ED: 1.1
LA ID, A-P, ES: 39 mm
LA diam end sys: 39 mm
LA diam index: 1.92 cm/m2
LA vol A4C: 40.4 mL
LA vol index: 25.6 mL/m2
LA vol: 52 mL
LV E/e' medial: 10.67
LV E/e'average: 10.67
LV PW d: 10 mm — AB (ref 0.6–1.1)
LV e' LATERAL: 12 cm/s
LVOT SV: 76 mL
LVOT VTI: 24.2 cm
LVOT area: 3.14 cm2
LVOT diameter: 20 mm
LVOT peak VTI: 0.8 cm
Lateral S' vel: 14.4 cm/s
MV Dec: 183
MV Peak grad: 7 mmHg
MV pk A vel: 104 m/s
MV pk E vel: 128 m/s
P 1/2 time: 54 ms
TAPSE: 23.1 mm
TDI e' lateral: 12
TDI e' medial: 7.62
VTI: 30.3 cm
Valve area index: 1.24
Valve area: 2.51 cm2

## 2016-10-04 ENCOUNTER — Ambulatory Visit
Admission: RE | Admit: 2016-10-04 | Discharge: 2016-10-04 | Disposition: A | Discharge status: Home / Self Care | Payer: 59 | Source: Ambulatory Visit | Attending: Radiation Oncology | Admitting: Radiation Oncology

## 2016-10-04 DIAGNOSIS — C50411 Malignant neoplasm of upper-outer quadrant of right female breast: Secondary | ICD-10-CM | POA: Diagnosis not present

## 2016-10-04 DIAGNOSIS — Z170 Estrogen receptor positive status [ER+]: Secondary | ICD-10-CM | POA: Diagnosis not present

## 2016-10-05 DIAGNOSIS — S63502A Unspecified sprain of left wrist, initial encounter: Secondary | ICD-10-CM | POA: Diagnosis not present

## 2016-10-05 DIAGNOSIS — M1990 Unspecified osteoarthritis, unspecified site: Secondary | ICD-10-CM | POA: Diagnosis not present

## 2016-10-05 DIAGNOSIS — M791 Myalgia: Secondary | ICD-10-CM | POA: Diagnosis not present

## 2016-10-05 DIAGNOSIS — N390 Urinary tract infection, site not specified: Secondary | ICD-10-CM | POA: Diagnosis not present

## 2016-10-07 ENCOUNTER — Other Ambulatory Visit: Payer: Self-pay | Admitting: *Deleted

## 2016-10-07 DIAGNOSIS — C50411 Malignant neoplasm of upper-outer quadrant of right female breast: Secondary | ICD-10-CM

## 2016-10-07 DIAGNOSIS — Z170 Estrogen receptor positive status [ER+]: Principal | ICD-10-CM

## 2016-10-10 DIAGNOSIS — C50411 Malignant neoplasm of upper-outer quadrant of right female breast: Secondary | ICD-10-CM | POA: Diagnosis not present

## 2016-10-10 DIAGNOSIS — Z170 Estrogen receptor positive status [ER+]: Secondary | ICD-10-CM | POA: Diagnosis not present

## 2016-10-11 DIAGNOSIS — M79641 Pain in right hand: Secondary | ICD-10-CM | POA: Diagnosis not present

## 2016-10-11 DIAGNOSIS — S63502A Unspecified sprain of left wrist, initial encounter: Secondary | ICD-10-CM | POA: Diagnosis not present

## 2016-10-12 ENCOUNTER — Ambulatory Visit
Admission: RE | Admit: 2016-10-12 | Discharge: 2016-10-12 | Disposition: A | Discharge status: Home / Self Care | Payer: 59 | Source: Ambulatory Visit | Attending: Radiation Oncology | Admitting: Radiation Oncology

## 2016-10-12 DIAGNOSIS — C50411 Malignant neoplasm of upper-outer quadrant of right female breast: Secondary | ICD-10-CM | POA: Diagnosis not present

## 2016-10-12 DIAGNOSIS — Z170 Estrogen receptor positive status [ER+]: Secondary | ICD-10-CM | POA: Diagnosis not present

## 2016-10-13 ENCOUNTER — Ambulatory Visit
Admission: RE | Admit: 2016-10-13 | Discharge: 2016-10-13 | Disposition: A | Discharge status: Home / Self Care | Payer: 59 | Source: Ambulatory Visit | Attending: Radiation Oncology | Admitting: Radiation Oncology

## 2016-10-13 DIAGNOSIS — C50411 Malignant neoplasm of upper-outer quadrant of right female breast: Secondary | ICD-10-CM | POA: Diagnosis not present

## 2016-10-14 ENCOUNTER — Ambulatory Visit
Admission: RE | Admit: 2016-10-14 | Discharge: 2016-10-14 | Disposition: A | Discharge status: Home / Self Care | Payer: 59 | Source: Ambulatory Visit | Attending: Radiation Oncology | Admitting: Radiation Oncology

## 2016-10-14 DIAGNOSIS — C50411 Malignant neoplasm of upper-outer quadrant of right female breast: Secondary | ICD-10-CM | POA: Diagnosis not present

## 2016-10-15 ENCOUNTER — Encounter: Payer: Self-pay | Admitting: Family Medicine

## 2016-10-17 ENCOUNTER — Ambulatory Visit
Admission: RE | Admit: 2016-10-17 | Discharge: 2016-10-17 | Disposition: A | Discharge status: Home / Self Care | Payer: 59 | Source: Ambulatory Visit | Attending: Radiation Oncology | Admitting: Radiation Oncology

## 2016-10-17 DIAGNOSIS — C50411 Malignant neoplasm of upper-outer quadrant of right female breast: Secondary | ICD-10-CM | POA: Diagnosis not present

## 2016-10-18 ENCOUNTER — Ambulatory Visit
Admission: RE | Admit: 2016-10-18 | Discharge: 2016-10-18 | Disposition: A | Discharge status: Home / Self Care | Payer: 59 | Source: Ambulatory Visit | Attending: Radiation Oncology | Admitting: Radiation Oncology

## 2016-10-18 DIAGNOSIS — D2261 Melanocytic nevi of right upper limb, including shoulder: Secondary | ICD-10-CM | POA: Diagnosis not present

## 2016-10-18 DIAGNOSIS — C50411 Malignant neoplasm of upper-outer quadrant of right female breast: Secondary | ICD-10-CM | POA: Diagnosis not present

## 2016-10-19 ENCOUNTER — Ambulatory Visit: Admission: RE | Admit: 2016-10-19 | Payer: 59 | Source: Ambulatory Visit

## 2016-10-20 ENCOUNTER — Ambulatory Visit
Admission: RE | Admit: 2016-10-20 | Discharge: 2016-10-20 | Disposition: A | Discharge status: Home / Self Care | Payer: 59 | Source: Ambulatory Visit | Attending: Radiation Oncology | Admitting: Radiation Oncology

## 2016-10-20 DIAGNOSIS — Z170 Estrogen receptor positive status [ER+]: Secondary | ICD-10-CM | POA: Diagnosis not present

## 2016-10-20 DIAGNOSIS — C50411 Malignant neoplasm of upper-outer quadrant of right female breast: Secondary | ICD-10-CM | POA: Diagnosis not present

## 2016-10-21 ENCOUNTER — Ambulatory Visit
Admission: RE | Admit: 2016-10-21 | Discharge: 2016-10-21 | Disposition: A | Discharge status: Home / Self Care | Payer: 59 | Source: Ambulatory Visit | Attending: Radiation Oncology | Admitting: Radiation Oncology

## 2016-10-21 DIAGNOSIS — C50411 Malignant neoplasm of upper-outer quadrant of right female breast: Secondary | ICD-10-CM | POA: Diagnosis not present

## 2016-10-24 ENCOUNTER — Ambulatory Visit: Admission: RE | Admit: 2016-10-24 | Payer: 59 | Source: Ambulatory Visit

## 2016-10-25 ENCOUNTER — Inpatient Hospital Stay: Payer: 59 | Attending: Radiation Oncology

## 2016-10-25 ENCOUNTER — Ambulatory Visit
Admission: RE | Admit: 2016-10-25 | Discharge: 2016-10-25 | Disposition: A | Discharge status: Home / Self Care | Payer: 59 | Source: Ambulatory Visit | Attending: Radiation Oncology | Admitting: Radiation Oncology

## 2016-10-25 ENCOUNTER — Ambulatory Visit: Payer: 59 | Admitting: Nurse Practitioner

## 2016-10-25 DIAGNOSIS — Z170 Estrogen receptor positive status [ER+]: Secondary | ICD-10-CM | POA: Insufficient documentation

## 2016-10-25 DIAGNOSIS — C50411 Malignant neoplasm of upper-outer quadrant of right female breast: Secondary | ICD-10-CM | POA: Insufficient documentation

## 2016-10-25 LAB — CBC
HEMATOCRIT: 42.1 % (ref 35.0–47.0)
HEMOGLOBIN: 14.6 g/dL (ref 12.0–16.0)
MCH: 29.9 pg (ref 26.0–34.0)
MCHC: 34.7 g/dL (ref 32.0–36.0)
MCV: 86.1 fL (ref 80.0–100.0)
Platelets: 273 10*3/uL (ref 150–440)
RBC: 4.89 MIL/uL (ref 3.80–5.20)
RDW: 13.7 % (ref 11.5–14.5)
WBC: 6.6 10*3/uL (ref 3.6–11.0)

## 2016-10-26 ENCOUNTER — Ambulatory Visit
Admission: RE | Admit: 2016-10-26 | Discharge: 2016-10-26 | Disposition: A | Discharge status: Home / Self Care | Payer: 59 | Source: Ambulatory Visit | Attending: Radiation Oncology | Admitting: Radiation Oncology

## 2016-10-26 DIAGNOSIS — C50411 Malignant neoplasm of upper-outer quadrant of right female breast: Secondary | ICD-10-CM | POA: Diagnosis not present

## 2016-10-26 DIAGNOSIS — M791 Myalgia: Secondary | ICD-10-CM | POA: Diagnosis not present

## 2016-10-26 DIAGNOSIS — M1990 Unspecified osteoarthritis, unspecified site: Secondary | ICD-10-CM | POA: Diagnosis not present

## 2016-10-27 ENCOUNTER — Ambulatory Visit
Admission: RE | Admit: 2016-10-27 | Discharge: 2016-10-27 | Disposition: A | Discharge status: Home / Self Care | Payer: 59 | Source: Ambulatory Visit | Attending: Radiation Oncology | Admitting: Radiation Oncology

## 2016-10-27 DIAGNOSIS — E538 Deficiency of other specified B group vitamins: Secondary | ICD-10-CM | POA: Diagnosis not present

## 2016-10-27 DIAGNOSIS — C50411 Malignant neoplasm of upper-outer quadrant of right female breast: Secondary | ICD-10-CM | POA: Diagnosis not present

## 2016-10-28 ENCOUNTER — Ambulatory Visit
Admission: RE | Admit: 2016-10-28 | Discharge: 2016-10-28 | Disposition: A | Discharge status: Home / Self Care | Payer: 59 | Source: Ambulatory Visit | Attending: Radiation Oncology | Admitting: Radiation Oncology

## 2016-10-28 DIAGNOSIS — Z170 Estrogen receptor positive status [ER+]: Secondary | ICD-10-CM | POA: Diagnosis not present

## 2016-10-28 DIAGNOSIS — C50411 Malignant neoplasm of upper-outer quadrant of right female breast: Secondary | ICD-10-CM | POA: Diagnosis not present

## 2016-10-30 ENCOUNTER — Encounter: Payer: Self-pay | Admitting: General Surgery

## 2016-10-31 ENCOUNTER — Ambulatory Visit
Admission: RE | Admit: 2016-10-31 | Discharge: 2016-10-31 | Disposition: A | Discharge status: Home / Self Care | Payer: 59 | Source: Ambulatory Visit | Attending: Radiation Oncology | Admitting: Radiation Oncology

## 2016-10-31 DIAGNOSIS — C50411 Malignant neoplasm of upper-outer quadrant of right female breast: Secondary | ICD-10-CM | POA: Diagnosis not present

## 2016-10-31 DIAGNOSIS — Z23 Encounter for immunization: Secondary | ICD-10-CM | POA: Diagnosis not present

## 2016-11-01 ENCOUNTER — Ambulatory Visit
Admission: RE | Admit: 2016-11-01 | Discharge: 2016-11-01 | Disposition: A | Discharge status: Home / Self Care | Payer: 59 | Source: Ambulatory Visit | Attending: Radiation Oncology | Admitting: Radiation Oncology

## 2016-11-01 DIAGNOSIS — C50411 Malignant neoplasm of upper-outer quadrant of right female breast: Secondary | ICD-10-CM | POA: Diagnosis not present

## 2016-11-02 ENCOUNTER — Ambulatory Visit: Payer: 59

## 2016-11-02 ENCOUNTER — Ambulatory Visit
Admission: RE | Admit: 2016-11-02 | Discharge: 2016-11-02 | Disposition: A | Discharge status: Home / Self Care | Payer: 59 | Source: Ambulatory Visit | Attending: Radiation Oncology | Admitting: Radiation Oncology

## 2016-11-02 DIAGNOSIS — C50411 Malignant neoplasm of upper-outer quadrant of right female breast: Secondary | ICD-10-CM | POA: Diagnosis not present

## 2016-11-03 ENCOUNTER — Ambulatory Visit
Admission: RE | Admit: 2016-11-03 | Discharge: 2016-11-03 | Disposition: A | Discharge status: Home / Self Care | Payer: 59 | Source: Ambulatory Visit | Attending: Radiation Oncology | Admitting: Radiation Oncology

## 2016-11-03 DIAGNOSIS — C50411 Malignant neoplasm of upper-outer quadrant of right female breast: Secondary | ICD-10-CM | POA: Diagnosis not present

## 2016-11-04 ENCOUNTER — Ambulatory Visit
Admission: RE | Admit: 2016-11-04 | Discharge: 2016-11-04 | Disposition: A | Discharge status: Home / Self Care | Payer: 59 | Source: Ambulatory Visit | Attending: Radiation Oncology | Admitting: Radiation Oncology

## 2016-11-04 DIAGNOSIS — C50411 Malignant neoplasm of upper-outer quadrant of right female breast: Secondary | ICD-10-CM | POA: Diagnosis not present

## 2016-11-04 DIAGNOSIS — Z170 Estrogen receptor positive status [ER+]: Secondary | ICD-10-CM | POA: Diagnosis not present

## 2016-11-07 ENCOUNTER — Ambulatory Visit
Admission: RE | Admit: 2016-11-07 | Discharge: 2016-11-07 | Disposition: A | Discharge status: Home / Self Care | Payer: 59 | Source: Ambulatory Visit | Attending: Radiation Oncology | Admitting: Radiation Oncology

## 2016-11-07 ENCOUNTER — Other Ambulatory Visit: Payer: Self-pay | Admitting: *Deleted

## 2016-11-07 DIAGNOSIS — C50411 Malignant neoplasm of upper-outer quadrant of right female breast: Secondary | ICD-10-CM | POA: Diagnosis not present

## 2016-11-07 MED ORDER — SILVER SULFADIAZINE 1 % EX CREA: 1.0000 "application " | TOPICAL_CREAM | Freq: Every day | CUTANEOUS | 6 refills | Status: DC

## 2016-11-08 ENCOUNTER — Inpatient Hospital Stay: Payer: 59 | Attending: Radiation Oncology

## 2016-11-08 ENCOUNTER — Ambulatory Visit
Admission: RE | Admit: 2016-11-08 | Discharge: 2016-11-08 | Disposition: A | Discharge status: Home / Self Care | Payer: 59 | Source: Ambulatory Visit | Attending: Radiation Oncology | Admitting: Radiation Oncology

## 2016-11-08 DIAGNOSIS — Z170 Estrogen receptor positive status [ER+]: Secondary | ICD-10-CM | POA: Diagnosis not present

## 2016-11-08 DIAGNOSIS — C50411 Malignant neoplasm of upper-outer quadrant of right female breast: Secondary | ICD-10-CM | POA: Insufficient documentation

## 2016-11-08 LAB — CBC
HEMATOCRIT: 42.4 % (ref 35.0–47.0)
Hemoglobin: 14.6 g/dL (ref 12.0–16.0)
MCH: 29.8 pg (ref 26.0–34.0)
MCHC: 34.5 g/dL (ref 32.0–36.0)
MCV: 86.4 fL (ref 80.0–100.0)
Platelets: 245 10*3/uL (ref 150–440)
RBC: 4.9 MIL/uL (ref 3.80–5.20)
RDW: 14 % (ref 11.5–14.5)
WBC: 6 10*3/uL (ref 3.6–11.0)

## 2016-11-09 ENCOUNTER — Ambulatory Visit
Admission: RE | Admit: 2016-11-09 | Discharge: 2016-11-09 | Disposition: A | Discharge status: Home / Self Care | Payer: 59 | Source: Ambulatory Visit | Attending: Radiation Oncology | Admitting: Radiation Oncology

## 2016-11-09 DIAGNOSIS — C50411 Malignant neoplasm of upper-outer quadrant of right female breast: Secondary | ICD-10-CM | POA: Diagnosis not present

## 2016-11-09 DIAGNOSIS — G4733 Obstructive sleep apnea (adult) (pediatric): Secondary | ICD-10-CM | POA: Diagnosis not present

## 2016-11-10 ENCOUNTER — Ambulatory Visit
Admission: RE | Admit: 2016-11-10 | Discharge: 2016-11-10 | Disposition: A | Discharge status: Home / Self Care | Payer: 59 | Source: Ambulatory Visit | Attending: Radiation Oncology | Admitting: Radiation Oncology

## 2016-11-10 ENCOUNTER — Ambulatory Visit: Payer: 59

## 2016-11-10 DIAGNOSIS — Z170 Estrogen receptor positive status [ER+]: Secondary | ICD-10-CM | POA: Diagnosis not present

## 2016-11-10 DIAGNOSIS — C50411 Malignant neoplasm of upper-outer quadrant of right female breast: Secondary | ICD-10-CM | POA: Diagnosis not present

## 2016-11-11 ENCOUNTER — Ambulatory Visit: Payer: 59

## 2016-11-14 ENCOUNTER — Ambulatory Visit: Payer: 59

## 2016-11-14 DIAGNOSIS — C50411 Malignant neoplasm of upper-outer quadrant of right female breast: Secondary | ICD-10-CM | POA: Diagnosis not present

## 2016-11-14 DIAGNOSIS — Z170 Estrogen receptor positive status [ER+]: Secondary | ICD-10-CM | POA: Diagnosis not present

## 2016-11-15 ENCOUNTER — Other Ambulatory Visit: Payer: Self-pay

## 2016-11-15 ENCOUNTER — Observation Stay
Admission: EM | Admit: 2016-11-15 | Discharge: 2016-11-18 | DRG: 872 | Disposition: A | Discharge status: Home / Self Care | Payer: 59 | Attending: Specialist | Admitting: Specialist

## 2016-11-15 ENCOUNTER — Telehealth: Payer: Self-pay | Admitting: Oncology

## 2016-11-15 ENCOUNTER — Encounter: Payer: Self-pay | Admitting: Nurse Practitioner

## 2016-11-15 ENCOUNTER — Ambulatory Visit: Payer: 59

## 2016-11-15 ENCOUNTER — Emergency Department: Payer: 59

## 2016-11-15 ENCOUNTER — Ambulatory Visit (INDEPENDENT_AMBULATORY_CARE_PROVIDER_SITE_OTHER): Payer: 59 | Admitting: Nurse Practitioner

## 2016-11-15 VITALS — BP 120/78 | HR 95 | Ht 61.0 in | Wt 230.5 lb

## 2016-11-15 DIAGNOSIS — E785 Hyperlipidemia, unspecified: Secondary | ICD-10-CM | POA: Diagnosis not present

## 2016-11-15 DIAGNOSIS — R002 Palpitations: Secondary | ICD-10-CM

## 2016-11-15 DIAGNOSIS — N61 Mastitis without abscess: Secondary | ICD-10-CM | POA: Diagnosis not present

## 2016-11-15 DIAGNOSIS — Z6841 Body Mass Index (BMI) 40.0 and over, adult: Secondary | ICD-10-CM | POA: Diagnosis not present

## 2016-11-15 DIAGNOSIS — C50411 Malignant neoplasm of upper-outer quadrant of right female breast: Secondary | ICD-10-CM | POA: Diagnosis not present

## 2016-11-15 DIAGNOSIS — A419 Sepsis, unspecified organism: Secondary | ICD-10-CM | POA: Diagnosis not present

## 2016-11-15 DIAGNOSIS — G4730 Sleep apnea, unspecified: Secondary | ICD-10-CM | POA: Diagnosis present

## 2016-11-15 DIAGNOSIS — Z888 Allergy status to other drugs, medicaments and biological substances status: Secondary | ICD-10-CM | POA: Diagnosis not present

## 2016-11-15 DIAGNOSIS — L03031 Cellulitis of right toe: Secondary | ICD-10-CM | POA: Diagnosis not present

## 2016-11-15 DIAGNOSIS — Z170 Estrogen receptor positive status [ER+]: Secondary | ICD-10-CM | POA: Diagnosis not present

## 2016-11-15 DIAGNOSIS — M79671 Pain in right foot: Secondary | ICD-10-CM | POA: Diagnosis not present

## 2016-11-15 DIAGNOSIS — Z79899 Other long term (current) drug therapy: Secondary | ICD-10-CM | POA: Diagnosis not present

## 2016-11-15 DIAGNOSIS — I10 Essential (primary) hypertension: Secondary | ICD-10-CM | POA: Diagnosis not present

## 2016-11-15 DIAGNOSIS — Z880 Allergy status to penicillin: Secondary | ICD-10-CM | POA: Diagnosis not present

## 2016-11-15 DIAGNOSIS — Y781 Therapeutic (nonsurgical) and rehabilitative radiological devices associated with adverse incidents: Secondary | ICD-10-CM | POA: Diagnosis present

## 2016-11-15 DIAGNOSIS — Z90710 Acquired absence of both cervix and uterus: Secondary | ICD-10-CM

## 2016-11-15 DIAGNOSIS — Z8249 Family history of ischemic heart disease and other diseases of the circulatory system: Secondary | ICD-10-CM

## 2016-11-15 DIAGNOSIS — Z923 Personal history of irradiation: Secondary | ICD-10-CM | POA: Diagnosis not present

## 2016-11-15 DIAGNOSIS — L0390 Cellulitis, unspecified: Secondary | ICD-10-CM | POA: Diagnosis not present

## 2016-11-15 DIAGNOSIS — E039 Hypothyroidism, unspecified: Secondary | ICD-10-CM | POA: Diagnosis present

## 2016-11-15 DIAGNOSIS — Z91041 Radiographic dye allergy status: Secondary | ICD-10-CM

## 2016-11-15 DIAGNOSIS — R55 Syncope and collapse: Secondary | ICD-10-CM | POA: Diagnosis not present

## 2016-11-15 DIAGNOSIS — Z885 Allergy status to narcotic agent status: Secondary | ICD-10-CM

## 2016-11-15 DIAGNOSIS — Y842 Radiological procedure and radiotherapy as the cause of abnormal reaction of the patient, or of later complication, without mention of misadventure at the time of the procedure: Secondary | ICD-10-CM | POA: Diagnosis not present

## 2016-11-15 DIAGNOSIS — S99921A Unspecified injury of right foot, initial encounter: Secondary | ICD-10-CM | POA: Diagnosis not present

## 2016-11-15 DIAGNOSIS — M7989 Other specified soft tissue disorders: Secondary | ICD-10-CM | POA: Diagnosis not present

## 2016-11-15 DIAGNOSIS — L03313 Cellulitis of chest wall: Secondary | ICD-10-CM | POA: Diagnosis not present

## 2016-11-15 DIAGNOSIS — M810 Age-related osteoporosis without current pathological fracture: Secondary | ICD-10-CM | POA: Diagnosis not present

## 2016-11-15 DIAGNOSIS — Z853 Personal history of malignant neoplasm of breast: Secondary | ICD-10-CM | POA: Diagnosis not present

## 2016-11-15 DIAGNOSIS — Y92239 Unspecified place in hospital as the place of occurrence of the external cause: Secondary | ICD-10-CM | POA: Diagnosis not present

## 2016-11-15 DIAGNOSIS — T2101XA Burn of unspecified degree of chest wall, initial encounter: Secondary | ICD-10-CM | POA: Diagnosis not present

## 2016-11-15 LAB — CBC WITH DIFFERENTIAL/PLATELET
BASOS ABS: 0 10*3/uL (ref 0–0.1)
BASOS PCT: 0 %
EOS ABS: 0.2 10*3/uL (ref 0–0.7)
Eosinophils Relative: 2 %
HCT: 42.1 % (ref 35.0–47.0)
HEMOGLOBIN: 14.6 g/dL (ref 12.0–16.0)
Lymphocytes Relative: 3 %
Lymphs Abs: 0.2 10*3/uL — ABNORMAL LOW (ref 1.0–3.6)
MCH: 29.4 pg (ref 26.0–34.0)
MCHC: 34.7 g/dL (ref 32.0–36.0)
MCV: 84.5 fL (ref 80.0–100.0)
Monocytes Absolute: 0.5 10*3/uL (ref 0.2–0.9)
Monocytes Relative: 5 %
NEUTROS PCT: 90 %
Neutro Abs: 8.6 10*3/uL — ABNORMAL HIGH (ref 1.4–6.5)
PLATELETS: 237 10*3/uL (ref 150–440)
RBC: 4.97 MIL/uL (ref 3.80–5.20)
RDW: 14.6 % — ABNORMAL HIGH (ref 11.5–14.5)
WBC: 9.6 10*3/uL (ref 3.6–11.0)

## 2016-11-15 LAB — COMPREHENSIVE METABOLIC PANEL
ALT: 25 U/L (ref 14–54)
AST: 22 U/L (ref 15–41)
Albumin: 4.3 g/dL (ref 3.5–5.0)
Alkaline Phosphatase: 78 U/L (ref 38–126)
Anion gap: 11 (ref 5–15)
BILIRUBIN TOTAL: 1 mg/dL (ref 0.3–1.2)
BUN: 13 mg/dL (ref 6–20)
CO2: 26 mmol/L (ref 22–32)
CREATININE: 0.77 mg/dL (ref 0.44–1.00)
Calcium: 9.3 mg/dL (ref 8.9–10.3)
Chloride: 99 mmol/L — ABNORMAL LOW (ref 101–111)
Glucose, Bld: 116 mg/dL — ABNORMAL HIGH (ref 65–99)
POTASSIUM: 3.8 mmol/L (ref 3.5–5.1)
Sodium: 136 mmol/L (ref 135–145)
TOTAL PROTEIN: 7.5 g/dL (ref 6.5–8.1)

## 2016-11-15 LAB — LACTIC ACID, PLASMA: LACTIC ACID, VENOUS: 1.4 mmol/L (ref 0.5–1.9)

## 2016-11-15 MED ORDER — VANCOMYCIN HCL IN DEXTROSE 1-5 GM/200ML-% IV SOLN
1000.0000 mg | Freq: Once | INTRAVENOUS | Status: AC
  Administered 2016-11-15: 1000 mg via INTRAVENOUS
  Filled 2016-11-15: qty 200

## 2016-11-15 MED ORDER — METRONIDAZOLE IN NACL 5-0.79 MG/ML-% IV SOLN
500.0000 mg | Freq: Once | INTRAVENOUS | Status: AC
  Administered 2016-11-15: 500 mg via INTRAVENOUS
  Filled 2016-11-15: qty 100

## 2016-11-15 MED ORDER — DEXTROSE 5 % IV SOLN
2.0000 g | Freq: Once | INTRAVENOUS | Status: DC
  Filled 2016-11-15 (×2): qty 2

## 2016-11-15 MED ORDER — METRONIDAZOLE IN NACL 5-0.79 MG/ML-% IV SOLN
500.0000 mg | Freq: Three times a day (TID) | INTRAVENOUS | Status: DC
  Filled 2016-11-15 (×2): qty 100

## 2016-11-15 MED ORDER — VANCOMYCIN HCL 10 G IV SOLR
1250.0000 mg | Freq: Two times a day (BID) | INTRAVENOUS | Status: DC
  Administered 2016-11-16 – 2016-11-17 (×3): 1250 mg via INTRAVENOUS
  Filled 2016-11-15 (×5): qty 1250

## 2016-11-15 MED ORDER — SODIUM CHLORIDE 0.9 % IV BOLUS (SEPSIS)
1000.0000 mL | Freq: Once | INTRAVENOUS | Status: AC
  Administered 2016-11-15: 1000 mL via INTRAVENOUS

## 2016-11-15 NOTE — Progress Notes (Signed)
Office Visit    Patient Name: Tammy Turner Date of Encounter: 11/15/2016  Primary Care Provider:  Juluis Pitch, MD Primary Cardiologist:  Jerilynn Mages. Fletcher Anon, MD   Chief Complaint    56 year old female with a history of hypertension, hyperlipidemia, hypothyroidism, pernicious anemia, obesity, sleep apnea, and right breast cancer undergoing radiation, who presents for follow-up after recent evaluation for syncope and palpitations.  Past Medical History    Past Medical History:  Diagnosis Date  . Allergic state   . Anemia   . Breast cancer of upper-outer quadrant of right female breast ([place]) 06/2016   ER 95%, PR 90%, HER-2/neu not overexpressed.  . Complication of anesthesia    SEVERE MUSCLE ACHES FROM DIAPHRAGM TO THIGHS X 2 DAYS AFTER PARTIAL MASTECTOMY ON 07-25-16-PT WAS GIVEN SUCCINYLCHOLINE FOR INDUCTION  . Dyspnea on exertion    a. 09/2013 Echo: EF 60-65%, no rwma;  b. 11/2013 Myoview: EF 64%, no ischemia; c. 09/2016 Echo: EF 55-60%, no rwma.  . Family history of ovarian cancer    Pt is My Risk cancer genetic testing neg 2016  . Genetic testing 2016   My Risk negative  . Gross hematuria   . Headache(784.0)   . Hyperlipidemia   . Hypertension   . Hypothyroidism   . Migraines   . Morbid obesity ([place])   . Osteopenia   . Osteopenia   . Pernicious anemia   . Sleep apnea 2010   uses CPAP  . Syncope    a. 09/2016 - seen in ED - limited w/u unrevealing; b. 09/2016 Echo: eF 55-60%, no rwma;  b. 10/2016 Event monitor:  no signficant arrhythmias. Occas pac's.   Past Surgical History:  Procedure Laterality Date  . ABDOMINAL HYSTERECTOMY  2011   [place]; Dr. Laurey Morale, due to leio/adenomyosis  . BREAST LUMPECTOMY Right 08/08/2016   Procedure: BREAST RE-EXCISION;  Surgeon: Robert Bellow, MD;  Location: [facility] ORS;  Service: General;  Laterality: Right;  . CESAREAN SECTION     x 2  . CHOLECYSTECTOMY    . COLONOSCOPY    . COLONOSCOPY WITH PROPOFOL N/A 10/05/2015   Procedure: COLONOSCOPY  WITH PROPOFOL;  Surgeon: Manya Silvas, MD;  Location: [facility] ENDOSCOPY;  Service: Endoscopy;  Laterality: N/A;  . DILATION AND CURETTAGE OF UTERUS    . FUNCTIONAL ENDOSCOPIC SINUS SURGERY    . PARTIAL MASTECTOMY WITH AXILLARY SENTINEL LYMPH NODE BIOPSY Right 07/25/2016   Procedure: PARTIAL MASTECTOMY WITH AXILLARY SENTINEL LYMPH NODE BIOPSY;  Surgeon: Robert Bellow, MD;  Location: [facility] ORS;  Service: General;  Laterality: Right;    Allergies  Allergies  Allergen Reactions  . Morphine Other (See Comments)    Depressed respirations  . Niacin Anaphylaxis  . Doxycycline Hives  . Iodine Hives and Itching  . Ivp Dye [Iodinated Diagnostic Agents] Hives and Itching  . Levaquin [Levofloxacin In D5w] Other (See Comments)    Tendonitis   . Cephalexin Rash  . Penicillins Hives and Rash    Has patient had a PCN reaction causing immediate rash, facial/tongue/throat swelling, SOB or lightheadedness with hypotension: No Has patient had a PCN reaction causing severe rash involving mucus membranes or skin necrosis: No Has patient had a PCN reaction that required hospitalization: No Has patient had a PCN reaction occurring within the last 10 years: Yes If all of the above answers are "NO", then may proceed with Cephalosporin use.     History of Present Illness    16 show female with the above, plus  past medical history including hypertension, hyperlipidemia, hypothyroidism, pernicious anemia, obesity, and sleep apnea. She was diagnosed with right-sided breast cancer earlier this year and is status post partial mastectomy with axillary sentinel lymph node biopsy in June. Postoperative course, gave by seroma requiring antibiotics and drainage. She is now undergoing radiation therapy and this is been couple occasions by blistering. She has been watching her diet closely and has lost 50 pounds since earlier this year. I saw her in mid August following a syncopal spell that occurred at a [employer] On that particular day, the air conditioning was broken and it was 90 outside. She wore an event monitor which did not show any significant arrhythmias. Echocardiogram showed normal LV function. She has had no recurrent presyncope or syncope. She denies chest pain, dyspnea, PND, orthopnea, dizziness, syncope, edema, or early satiety.  Home Medications    Prior to Admission medications   Medication Sig Start Date End Date Taking? Authorizing Provider  ASCORBIC ACID PO Take 2 tablets by mouth every evening.   Yes [provider]  Carboxymethylcellulose Sodium (THERATEARS OP) Place 1 drop into both eyes 2 (two) times daily as needed (dry eyes).   Yes [provider]  cholecalciferol (VITAMIN D) 1000 units tablet Take 1,000 Units by mouth every evening.   Yes [provider]  Cyanocobalamin (VITAMIN B-12 IJ) Inject 1,000 mcg as directed every 30 (thirty) days.   Yes [provider]  diclofenac (VOLTAREN) 50 MG EC tablet Take by mouth. 10/11/16 10/11/17 Yes [provider]  fluticasone (FLONASE) 50 MCG/ACT nasal spray Place 2 sprays into both nostrils daily.   Yes [provider]  losartan-hydrochlorothiazide (HYZAAR) 100-25 MG tablet TAKE 1 TABLET BY MOUTH EVERY DAY 09/26/16  Yes [provider]  silver sulfADIAZINE (SILVADENE) 1 % cream Apply 1 application topically daily. 11/07/16  Yes Chrystal, Eulas Post, MD  thyroid (ARMOUR) 90 MG tablet Take 90 mg by mouth daily.   Yes [provider]    Review of Systems    Rare palps.  She denies chest pain, dyspnea, pnd, orthopnea, n, v, dizziness, syncope, edema, weight gain, or early satiety.  All other systems reviewed and are otherwise negative except as noted above.  Physical Exam    VS:  BP 120/78 (BP Location: Left Arm, Patient Position: Sitting, Cuff Size: Normal)   Pulse 95   Ht _0  (1.549 m)   Wt 230 lb 8 oz (104.6 kg)   BMI 43.55 kg/m  , BMI Body mass index is  43.55 kg/m. GEN: Well nourished, well developed, in no acute distress.  HEENT: normal.  Neck: Supple, no JVD, carotid bruits, or masses. Cardiac: RRR, 1/6 systolic murmur throughout sternal border, no rubs, or gallops. No clubbing, cyanosis, edema.  Radials/DP/PT 2+ and equal bilaterally.  Respiratory:  Respirations regular and unlabored, clear to auscultation bilaterally. GI: Soft, nontender, nondistended, BS + x 4. MS: no deformity or atrophy. Skin: warm and dry, no rash. Neuro:  Strength and sensation are intact. Psych: Normal affect.  Accessory Clinical Findings    Echocardiogram and event monitoring reviewed in detail with patient. Past medical history updated with results.  Assessment & Plan    1.  Syncope: Patient loss consciousness in restaurant without air conditioning on a hot day in August. Workup in the emergency department was unrevealing. Follow-up echo shows normal LV function. Event monitoring does not show any significant arrhythmias. She has had recurrence of symptoms. Suspect symptoms most likely brought on by dehydration  and/or vasovagal syncope. No further workup at this time. Next  2. Palpitations: Patient with occasional palpitations. Event monitoring did not show any significant arrhythmias but did show some dramatic PACs. These are rare.  3. History of hypertension: Blood pressure now stable with weight loss. Though this is still on her list, she is no longer taking losartan-HCTZ.  4. Right breast cancer: Undergoing radiation.  5. Morbid obesity: Patient has lost 50 pounds since beginning the year. She is really doing a great job in eating more whole foods based diet and increasing activity.  6. Disposition: Follow-up in 6 months or sooner if necessary.   Murray Hodgkins, NP 11/15/2016, 4:50 PM

## 2016-11-15 NOTE — ED Notes (Signed)
Pt has fever, blisters beneath right breast with pain and tenderness.  Pt also has redness and pain to right 4th toe.   Started on abx today   Pt stopped radiation for breast cancer last week due to blisters.  md at bedside .  Iv started and labs sent.  Family with pt

## 2016-11-15 NOTE — ED Notes (Signed)
Pt waiting on admission.

## 2016-11-15 NOTE — Patient Instructions (Signed)
Medication Instructions:  Your physician recommends that you continue on your current medications as directed. Please refer to the Current Medication list given to you today.   Labwork: NONE  Testing/Procedures: NONE  Follow-Up: Your physician wants you to follow-up in: 6 MONTHS WITH DR ARIDA. You will receive a reminder letter in the mail two months in advance. If you don't receive a letter, please call our office to schedule the follow-up appointment.   If you need a refill on your cardiac medications before your next appointment, please call your pharmacy.   

## 2016-11-15 NOTE — ED Provider Notes (Signed)
[facility] Emergency Department Provider Note  Time seen: 10:19 PM  I have reviewed the triage vital signs and the nursing notes.   HISTORY  Chief Complaint Fever (Following radiation treatment for right sided breast cancer) and Cellulitis    HPI Tammy Turner is a 56 y.o. female With a past medical history of breast cancer on radiation treatment, hypertension, hyperlipidemia, presents to the emergency department for fever and cellulitis. According to the patient she is receiving radiation treatments to her right breast. States she has had significant skin breakdown, neck briefly stopped her radiation treatments due to skin breakdown. yesterday the patient also noted redness and swelling to her right toe. She saw her doctor who placed her on Bactrim which she took this morning and this evening. This evening the patient developed chills with a fever to 103 she called her doctor who referred her to the emergency department for evaluation.  Past Medical History:  Diagnosis Date  . Allergic state   . Anemia   . Breast cancer of upper-outer quadrant of right female breast ([place]) 06/2016   ER 95%, PR 90%, HER-2/neu not overexpressed.  . Complication of anesthesia    SEVERE MUSCLE ACHES FROM DIAPHRAGM TO THIGHS X 2 DAYS AFTER PARTIAL MASTECTOMY ON 07-25-16-PT WAS GIVEN SUCCINYLCHOLINE FOR INDUCTION  . Dyspnea on exertion    a. 09/2013 Echo: EF 60-65%, no rwma;  b. 11/2013 Myoview: EF 64%, no ischemia; c. 09/2016 Echo: EF 55-60%, no rwma.  . Family history of ovarian cancer    Pt is My Risk cancer genetic testing neg 2016  . Genetic testing 2016   My Risk negative  . Gross hematuria   . Headache(784.0)   . Hyperlipidemia   . Hypertension   . Hypothyroidism   . Migraines   . Morbid obesity ([place])   . Osteopenia   . Osteopenia   . Pernicious anemia   . Sleep apnea 2010   uses CPAP  . Syncope    a. 09/2016 - seen in ED - limited w/u unrevealing; b. 09/2016 Echo: eF  55-60%, no rwma;  b. 10/2016 Event monitor:  no signficant arrhythmias. Occas pac's.    Patient Active Problem List   Diagnosis Date Noted  . Heart murmur 09/21/2016  . Seroma of breast 08/17/2016  . Malignant neoplasm of upper-outer quadrant of right breast in female, estrogen receptor positive ([place]) 07/12/2016  . Morbid obesity ([place]) 11/12/2013  . Hypertension     Past Surgical History:  Procedure Laterality Date  . ABDOMINAL HYSTERECTOMY  2011   [place]; Dr. Laurey Morale, due to leio/adenomyosis  . BREAST LUMPECTOMY Right 08/08/2016   Procedure: BREAST RE-EXCISION;  Surgeon: Robert Bellow, MD;  Location: [facility] ORS;  Service: General;  Laterality: Right;  . CESAREAN SECTION     x 2  . CHOLECYSTECTOMY    . COLONOSCOPY    . COLONOSCOPY WITH PROPOFOL N/A 10/05/2015   Procedure: COLONOSCOPY WITH PROPOFOL;  Surgeon: Manya Silvas, MD;  [facility] ENDOSCOPY;  Service: Endoscopy;  Laterality: N/A;  . DILATION AND CURETTAGE OF UTERUS    . FUNCTIONAL ENDOSCOPIC SINUS SURGERY    . PARTIAL MASTECTOMY WITH AXILLARY SENTINEL LYMPH NODE BIOPSY Right 07/25/2016   Procedure: PARTIAL MASTECTOMY WITH AXILLARY SENTINEL LYMPH NODE BIOPSY;  Surgeon: Robert Bellow, MD;  Location: [facility] ORS;  Service: General;  Laterality: Right;    Prior to Admission medications   Medication Sig Start Date End Date Taking? Authorizing Provider  ASCORBIC ACID PO Take  2 tablets by mouth every evening.   Yes [provider]  Carboxymethylcellulose Sodium (THERATEARS OP) Place 1 drop into both eyes 2 (two) times daily as needed (dry eyes).   Yes [provider]  cholecalciferol (VITAMIN D) 1000 units tablet Take 1,000 Units by mouth every evening.   Yes [provider]  Cyanocobalamin (VITAMIN B-12 IJ) Inject 1,000 mcg as directed every 30 (thirty) days.   Yes [provider]  diclofenac (VOLTAREN) 50 MG EC tablet Take 50 mg by mouth 2 (two) times daily.    Yes [provider]  fluticasone (FLONASE) 50 MCG/ACT nasal spray Place 2 sprays into both nostrils daily.   Yes [provider]  losartan-hydrochlorothiazide (HYZAAR) 100-25 MG tablet TAKE 1 TABLET BY MOUTH EVERY DAY 09/26/16  Yes [provider]  silver sulfADIAZINE (SILVADENE) 1 % cream Apply 1 application topically daily. 11/07/16  Yes Chrystal, Eulas Post, MD  sulfamethoxazole-trimethoprim (BACTRIM DS,SEPTRA DS) 800-160 MG tablet Take 1 tablet by mouth 2 (two) times daily. 11/15/16 11/25/16 Yes [provider]  thyroid (ARMOUR) 90 MG tablet Take 90 mg by mouth daily.   Yes [provider]    Allergies  Allergen Reactions  . Morphine Other (See Comments)    Depressed respirations  . Niacin Anaphylaxis  . Doxycycline Hives  . Iodine Hives and Itching  . Ivp Dye [Iodinated Diagnostic Agents] Hives and Itching  . Levaquin [Levofloxacin In D5w] Other (See Comments)    Tendonitis   . Cephalexin Rash  . Penicillins Hives and Rash    Has patient had a PCN reaction causing immediate rash, facial/tongue/throat swelling, SOB or lightheadedness with hypotension: No Has patient had a PCN reaction causing severe rash involving mucus membranes or skin necrosis: No Has patient had a PCN reaction that required hospitalization: No Has patient had a PCN reaction occurring within the last 10 years: Yes If all of the above answers are "NO", then may proceed with Cephalosporin use.     Family History  Problem Relation Age of Onset  . Pulmonary embolism Mother   . Hypertension Mother   . Hyperlipidemia Mother   . Hypertension Father   . Ovarian cancer Maternal Grandmother        49  . Colon cancer Paternal Grandmother        86  . Breast cancer Neg Hx     Social History Social History  Substance Use Topics  . Smoking status: Never Smoker  . Smokeless tobacco: Never Used  . Alcohol use No    Review of Systems Constitutional: positive for fever. Positive for  chills. Cardiovascular: Negative for chest pain. Respiratory: Negative for shortness of breath. Gastrointestinal: Negative for abdominal pain Musculoskeletal: right toe pain and redness and swelling Skin: rash to right breast, redness tenderness and swelling to right fourth toe. All other ROS negative  ____________________________________________   PHYSICAL EXAM:  VITAL SIGNS: ED Triage Vitals  Enc Vitals Group     BP 11/15/16 2133 (!) 150/88     Pulse Rate 11/15/16 2133 (!) 121     Resp 11/15/16 2133 20     Temp 11/15/16 2133 99.9 F (37.7 C)     Temp Source 11/15/16 2133 Oral     SpO2 11/15/16 2133 99 %     Weight 11/15/16 2133 230 lb 8 oz (104.6 kg)     Height 11/15/16 2133 5' 1"  (1.549 m)     Head Circumference --      Peak Flow --  Pain Score 11/15/16 2132 5     Pain Loc --      Pain Edu? --      Excl. in [place]? --     Constitutional: Alert and oriented. Well appearing and in no distress. Eyes: Normal exam ENT   Head: Normocephalic and atraumatic.   Mouth/Throat: Mucous membranes are moist. Cardiovascular: regular rhythm, rate around 120. Respiratory: Normal respiratory effort without tachypnea nor retractions. Breath sounds are clear  Gastrointestinal: Soft and nontender. No distention. Musculoskeletal: patient has an erythematous swollen and tender right fourth toe with some erythema to the bottom of the right foot. Neurologic:  Normal speech and language. No gross focal neurologic deficits  Skin:  patient has erythema with skin breakdown under the right breast consistent with likely cellulitis. As well as right fourth toe redness swelling and tenderness.no breast mass palpated. No discharge. No signs breast abscess. Psychiatric: Mood and affect are normal. Speech and behavior are normal.   ____________________________________________    EKG  EKG reviewed and interpreted by myself shows sinus tachycardia 106 bpm, narrow QRS, normal axis, normal  intervals, nonspecific ST changes. No ST elevation.  ____________________________________________   INITIAL IMPRESSION / ASSESSMENT AND PLAN / ED COURSE  Pertinent labs & imaging results that were available during my care of the patient were reviewed by me and considered in my medical decision making (see chart for details).  patient presents to the emergency department for fever to 103, redness under her right breast as well as redness to her right fourth toe. Differential this time include cellulitis, sepsis, bacteremia with distal seeding. We will check labs, blood cultures, urine, urine culture. We will start IV antibiotics. We will start IV fluids. I anticipate admission to the hospital.  patient states she had an x-ray of her foot earlier today and does not want a repeat x-ray. I tried to review this and care of your but am unable to find it. Patient states the x-ray just showed cellulitis. We will hold off for now as per patient wishes. Patient is labs are largely within normal limits with a lactate of 1.4 and a normal white blood cell count. We'll admit to the hospital for continued IV antibiotics until the cultures have resulted. _____________________________   FINAL CLINICAL IMPRESSION(S) / ED DIAGNOSES  cellulitis    Harvest Dark, MD 11/15/16 2258

## 2016-11-15 NOTE — ED Notes (Signed)
Pt took 1000mg  tylenol at [place] for fever. Face flushed now.

## 2016-11-15 NOTE — ED Triage Notes (Signed)
Patient is undergoing radiation for breast CA. They stopped the treatment due to blisters around the right breast. She saw her MD this morning because of redness and swelling as well as blisters and was placed on an antibiotic. Oncology told her it may not be enough. She has redness and swelling in the right fourth toe as well. MD told patient to come to ED for IV antibiotics.

## 2016-11-15 NOTE — ED Notes (Signed)
[facility] with pt for admission

## 2016-11-15 NOTE — Telephone Encounter (Signed)
Patient called answering service reporting having fever. She has not been evaluated by [facility] yet. She follows up Dr.Chrystal for radiation.  She has early stage breast cancer s/p surgical resection and s/p whole breast radiation by Dr.Chrystal.  Patient reports having skin blisters. She was started on Bactrim DS today by [facility] clinic however start to have fever this evening, not feeling well.  her temperature is 103 after taking tylenol.  Advise patient to go to ER for further evaluation. She voices understanding.

## 2016-11-15 NOTE — H&P (Signed)
[facility] [place] NAME: Tammy Turner    MR#:  [id number]  DATE OF BIRTH:  1961/02/03  DATE OF ADMISSION:  11/15/2016  PRIMARY CARE PHYSICIAN: Juluis Pitch, MD   REQUESTING/REFERRING PHYSICIAN: Kerman Passey, MD  CHIEF COMPLAINT:   Chief Complaint  Patient presents with  . Fever    Following radiation treatment for right sided breast cancer  . Cellulitis    HISTORY OF PRESENT ILLNESS:  Tammy Turner  is a 56 y.o. female who presents with Fever and chills. She is a breast cancer patient is getting radiation therapy. She also had generalized malaise. When she was not feeling better she came to the ED for evaluation. Here she is found to meet sepsis criteria and has some radiation skin injury under her right breast with likely superimposed cellulitis as well as some right third toe cellulitis. Hospitals were called for admission  PAST MEDICAL HISTORY:   Past Medical History:  Diagnosis Date  . Allergic state   . Anemia   . Breast cancer of upper-outer quadrant of right female breast ([place]) 06/2016   ER 95%, PR 90%, HER-2/neu not overexpressed.  . Complication of anesthesia    SEVERE MUSCLE ACHES FROM DIAPHRAGM TO THIGHS X 2 DAYS AFTER PARTIAL MASTECTOMY ON 07-25-16-PT WAS GIVEN SUCCINYLCHOLINE FOR INDUCTION  . Dyspnea on exertion    a. 09/2013 Echo: EF 60-65%, no rwma;  b. 11/2013 Myoview: EF 64%, no ischemia; c. 09/2016 Echo: EF 55-60%, no rwma.  . Family history of ovarian cancer    Pt is My Risk cancer genetic testing neg 2016  . Genetic testing 2016   My Risk negative  . Gross hematuria   . Headache(784.0)   . Hyperlipidemia   . Hypertension   . Hypothyroidism   . Migraines   . Morbid obesity ([place])   . Osteopenia   . Osteopenia   . Pernicious anemia   . Sleep apnea 2010   uses CPAP  . Syncope    a. 09/2016 - seen in ED - limited w/u unrevealing; b. 09/2016 Echo: eF 55-60%, no rwma;  b. 10/2016 Event monitor:  no  signficant arrhythmias. Occas pac's.    PAST SURGICAL HISTORY:   Past Surgical History:  Procedure Laterality Date  . ABDOMINAL HYSTERECTOMY  2011   [facility]; Dr. Laurey Morale, due to leio/adenomyosis  . BREAST LUMPECTOMY Right 08/08/2016   Procedure: BREAST RE-EXCISION;  Surgeon: Robert Bellow, MD;  Location: [facility] ORS;  Service: General;  Laterality: Right;  . CESAREAN SECTION     x 2  . CHOLECYSTECTOMY    . COLONOSCOPY    . COLONOSCOPY WITH PROPOFOL N/A 10/05/2015   Procedure: COLONOSCOPY WITH PROPOFOL;  Surgeon: Manya Silvas, MD;  Location: [facility] ENDOSCOPY;  Service: Endoscopy;  Laterality: N/A;  . DILATION AND CURETTAGE OF UTERUS    . FUNCTIONAL ENDOSCOPIC SINUS SURGERY    . PARTIAL MASTECTOMY WITH AXILLARY SENTINEL LYMPH NODE BIOPSY Right 07/25/2016   Procedure: PARTIAL MASTECTOMY WITH AXILLARY SENTINEL LYMPH NODE BIOPSY;  Surgeon: Robert Bellow, MD;  Location: [facility] ORS;  Service: General;  Laterality: Right;    SOCIAL HISTORY:   Social History  Substance Use Topics  . Smoking status: Never Smoker  . Smokeless tobacco: Never Used  . Alcohol use No    FAMILY HISTORY:   Family History  Problem Relation Age of Onset  . Pulmonary embolism Mother   . Hypertension Mother   . Hyperlipidemia Mother   .  Hypertension Father   . Ovarian cancer Maternal Grandmother        75  . Colon cancer Paternal Grandmother        26  . Breast cancer Neg Hx     DRUG ALLERGIES:   Allergies  Allergen Reactions  . Morphine Other (See Comments)    Depressed respirations  . Niacin Anaphylaxis  . Doxycycline Hives  . Iodine Hives and Itching  . Ivp Dye [Iodinated Diagnostic Agents] Hives and Itching  . Levaquin [Levofloxacin In D5w] Other (See Comments)    Tendonitis   . Cephalexin Rash  . Penicillins Hives and Rash    Has patient had a PCN reaction causing immediate rash, facial/tongue/throat swelling, SOB or lightheadedness with hypotension: No Has patient had a PCN reaction  causing severe rash involving mucus membranes or skin necrosis: No Has patient had a PCN reaction that required hospitalization: No Has patient had a PCN reaction occurring within the last 10 years: Yes If all of the above answers are "NO", then may proceed with Cephalosporin use.     MEDICATIONS AT HOME:   Prior to Admission medications   Medication Sig Start Date End Date Taking? Authorizing Provider  ASCORBIC ACID PO Take 2 tablets by mouth every evening.   Yes [provider]  Carboxymethylcellulose Sodium (THERATEARS OP) Place 1 drop into both eyes 2 (two) times daily as needed (dry eyes).   Yes [provider]  cholecalciferol (VITAMIN D) 1000 units tablet Take 1,000 Units by mouth every evening.   Yes [provider]  Cyanocobalamin (VITAMIN B-12 IJ) Inject 1,000 mcg as directed every 30 (thirty) days.   Yes [provider]  diclofenac (VOLTAREN) 50 MG EC tablet Take 50 mg by mouth 2 (two) times daily.    Yes [provider]  fluticasone (FLONASE) 50 MCG/ACT nasal spray Place 2 sprays into both nostrils daily.   Yes [provider]  losartan-hydrochlorothiazide (HYZAAR) 100-25 MG tablet TAKE 1 TABLET BY MOUTH EVERY DAY 09/26/16  Yes [provider]  silver sulfADIAZINE (SILVADENE) 1 % cream Apply 1 application topically daily. 11/07/16  Yes Chrystal, Eulas Post, MD  sulfamethoxazole-trimethoprim (BACTRIM DS,SEPTRA DS) 800-160 MG tablet Take 1 tablet by mouth 2 (two) times daily. 11/15/16 11/25/16 Yes [provider]  thyroid (ARMOUR) 90 MG tablet Take 90 mg by mouth daily.   Yes [provider]    REVIEW OF SYSTEMS:  Review of Systems  Constitutional: Positive for chills, fever and malaise/fatigue. Negative for weight loss.  HENT: Negative for ear pain, hearing loss and tinnitus.   Eyes: Negative for blurred vision, double vision, pain and redness.  Respiratory: Negative for cough, hemoptysis and shortness of  breath.   Cardiovascular: Negative for chest pain, palpitations, orthopnea and leg swelling.  Gastrointestinal: Negative for abdominal pain, constipation, diarrhea, nausea and vomiting.  Genitourinary: Negative for dysuria, frequency and hematuria.  Musculoskeletal: Negative for back pain, joint pain and neck pain.  Skin: Positive for rash.  Neurological: Negative for dizziness, tremors, focal weakness and weakness.  Endo/Heme/Allergies: Negative for polydipsia. Does not bruise/bleed easily.  Psychiatric/Behavioral: Negative for depression. The patient is not nervous/anxious and does not have insomnia.      VITAL SIGNS:   Vitals:   11/15/16 2230 11/15/16 2245 11/15/16 2300 11/15/16 2330  BP:      Pulse: (!) 107 100 100 (!) 104  Resp: 18 19 20 17   Temp:      TempSrc:      SpO2: 100%  100% 100% 96%  Weight:      Height:       Wt Readings from Last 3 Encounters:  11/15/16 104.6 kg (230 lb 8 oz)  11/15/16 104.6 kg (230 lb 8 oz)  09/28/16 110.1 kg (242 lb 11.6 oz)    PHYSICAL EXAMINATION:  Physical Exam  Vitals reviewed. Constitutional: She is oriented to person, place, and time. She appears well-developed and well-nourished. No distress.  HENT:  Head: Normocephalic and atraumatic.  Mouth/Throat: Oropharynx is clear and moist.  Eyes: Pupils are equal, round, and reactive to light. Conjunctivae and EOM are normal. No scleral icterus.  Neck: Normal range of motion. Neck supple. No JVD present. No thyromegaly present.  Cardiovascular: Normal rate, regular rhythm and intact distal pulses.  Exam reveals no gallop and no friction rub.   No murmur heard. Respiratory: Effort normal and breath sounds normal. No respiratory distress. She has no wheezes. She has no rales.  GI: Soft. Bowel sounds are normal. She exhibits no distension. There is no tenderness.  Musculoskeletal: Normal range of motion. She exhibits no edema.  No arthritis, no gout  Lymphadenopathy:    She has no cervical  adenopathy.  Neurological: She is alert and oriented to person, place, and time. No cranial nerve deficit.  No dysarthria, no aphasia  Skin: Skin is warm and dry. No rash noted. There is erythema (right 3rd toe and under right breast).  Psychiatric: She has a normal mood and affect. Her behavior is normal. Judgment and thought content normal.    LABORATORY PANEL:   CBC  Recent Labs Lab 11/15/16 2212  WBC 9.6  HGB 14.6  HCT 42.1  PLT 237   ------------------------------------------------------------------------------------------------------------------  Chemistries   Recent Labs Lab 11/15/16 2212  NA 136  K 3.8  CL 99*  CO2 26  GLUCOSE 116*  BUN 13  CREATININE 0.77  CALCIUM 9.3  AST 22  ALT 25  ALKPHOS 78  BILITOT 1.0   ------------------------------------------------------------------------------------------------------------------  Cardiac Enzymes No results for input(s): TROPONINI in the last 168 hours. ------------------------------------------------------------------------------------------------------------------  RADIOLOGY:  No results found.  EKG:   Orders placed or performed during the hospital encounter of 11/15/16  . ED EKG 12-Lead  . ED EKG 12-Lead    IMPRESSION AND PLAN:  Principal Problem:   Sepsis ([place]) - IV antibiotics, lactate within normal limits, hemodynamically stable, cultures sent from the ED Active Problems:   Cellulitis - IV antibiotics   Hypertension - stable, continue home meds   Malignant neoplasm of upper-outer quadrant of right breast in female, estrogen receptor positive ([place]) - getting radiation treatment, likely radiation skin injury under her right breast which predispose to superimposed cellulitis   Hypothyroidism - home dose thyroid replacement  All the records are reviewed and case discussed with ED provider. Management plans discussed with the patient and/or family.  DVT PROPHYLAXIS: SubQ lovenox  GI PROPHYLAXIS:  None  ADMISSION STATUS: Inpatient  CODE STATUS: Full Code Status History    This patient does not have a recorded code status. Please follow your organizational policy for patients in this situation.      TOTAL TIME TAKING CARE OF THIS PATIENT: 45 minutes.   Mitchell Iwanicki [place] 11/15/2016, 11:37 PM  [facility]  Office  [phone]  CC: Primary care physician; Juluis Pitch, MD  Note:  This document was prepared using Dragon voice recognition software and may include unintentional dictation errors.

## 2016-11-16 ENCOUNTER — Ambulatory Visit: Payer: 59

## 2016-11-16 ENCOUNTER — Telehealth: Payer: Self-pay | Admitting: *Deleted

## 2016-11-16 DIAGNOSIS — L0390 Cellulitis, unspecified: Secondary | ICD-10-CM | POA: Diagnosis not present

## 2016-11-16 DIAGNOSIS — I10 Essential (primary) hypertension: Secondary | ICD-10-CM | POA: Diagnosis not present

## 2016-11-16 DIAGNOSIS — A419 Sepsis, unspecified organism: Secondary | ICD-10-CM | POA: Diagnosis not present

## 2016-11-16 LAB — BASIC METABOLIC PANEL
Anion gap: 10 (ref 5–15)
BUN: 11 mg/dL (ref 6–20)
CALCIUM: 8.7 mg/dL — AB (ref 8.9–10.3)
CO2: 24 mmol/L (ref 22–32)
CREATININE: 0.69 mg/dL (ref 0.44–1.00)
Chloride: 102 mmol/L (ref 101–111)
GFR calc non Af Amer: >60 mL/min (ref >60)
Glucose, Bld: 106 mg/dL — ABNORMAL HIGH (ref 65–99)
Potassium: 3.7 mmol/L (ref 3.5–5.1)
SODIUM: 136 mmol/L (ref 135–145)

## 2016-11-16 LAB — URINALYSIS, ROUTINE W REFLEX MICROSCOPIC
BILIRUBIN URINE: NEGATIVE
Glucose, UA: NEGATIVE mg/dL
KETONES UR: 20 mg/dL — AB
Nitrite: NEGATIVE
Protein, ur: NEGATIVE mg/dL
SPECIFIC GRAVITY, URINE: 1.014 (ref 1.005–1.030)
SQUAMOUS EPITHELIAL / LPF: NONE SEEN
pH: 6 (ref 5.0–8.0)

## 2016-11-16 LAB — CBC
HCT: 37.6 % (ref 35.0–47.0)
Hemoglobin: 13 g/dL (ref 12.0–16.0)
MCH: 29.4 pg (ref 26.0–34.0)
MCHC: 34.7 g/dL (ref 32.0–36.0)
MCV: 84.6 fL (ref 80.0–100.0)
PLATELETS: 241 10*3/uL (ref 150–440)
RBC: 4.44 MIL/uL (ref 3.80–5.20)
RDW: 14.2 % (ref 11.5–14.5)
WBC: 8.1 10*3/uL (ref 3.6–11.0)

## 2016-11-16 MED ORDER — DIPHENHYDRAMINE HCL 25 MG PO CAPS
25.0000 mg | ORAL_CAPSULE | Freq: Four times a day (QID) | ORAL | Status: DC | PRN
  Administered 2016-11-16 – 2016-11-17 (×2): 25 mg via ORAL
  Filled 2016-11-16 (×3): qty 1

## 2016-11-16 MED ORDER — LOSARTAN POTASSIUM 50 MG PO TABS
100.0000 mg | ORAL_TABLET | Freq: Every day | ORAL | Status: DC
  Filled 2016-11-16: qty 2

## 2016-11-16 MED ORDER — ONDANSETRON HCL 4 MG PO TABS: 4.0000 mg | ORAL_TABLET | Freq: Four times a day (QID) | ORAL | Status: DC | PRN

## 2016-11-16 MED ORDER — ENOXAPARIN SODIUM 40 MG/0.4ML ~~LOC~~ SOLN
40.0000 mg | Freq: Two times a day (BID) | SUBCUTANEOUS | Status: DC
  Administered 2016-11-16 – 2016-11-18 (×4): 40 mg via SUBCUTANEOUS
  Filled 2016-11-16 (×4): qty 0.4

## 2016-11-16 MED ORDER — ACETAMINOPHEN 650 MG RE SUPP: 650.0000 mg | Freq: Four times a day (QID) | RECTAL | Status: DC | PRN

## 2016-11-16 MED ORDER — HYDROCHLOROTHIAZIDE 25 MG PO TABS
25.0000 mg | ORAL_TABLET | Freq: Every day | ORAL | Status: DC
  Filled 2016-11-16: qty 1

## 2016-11-16 MED ORDER — THYROID 60 MG PO TABS
90.0000 mg | ORAL_TABLET | Freq: Every day | ORAL | Status: DC
  Filled 2016-11-16: qty 1

## 2016-11-16 MED ORDER — DICLOFENAC SODIUM 25 MG PO TBEC
50.0000 mg | DELAYED_RELEASE_TABLET | Freq: Two times a day (BID) | ORAL | Status: DC
  Administered 2016-11-16 – 2016-11-18 (×3): 25 mg via ORAL
  Filled 2016-11-16 (×4): qty 2
  Filled 2016-11-16: qty 1
  Filled 2016-11-16: qty 2

## 2016-11-16 MED ORDER — THYROID 60 MG PO TABS
90.0000 mg | ORAL_TABLET | Freq: Every day | ORAL | Status: DC
  Administered 2016-11-16 – 2016-11-18 (×3): 90 mg via ORAL
  Filled 2016-11-16 (×3): qty 2

## 2016-11-16 MED ORDER — LOSARTAN POTASSIUM-HCTZ 100-25 MG PO TABS: 1.0000 | ORAL_TABLET | Freq: Every day | ORAL | Status: DC

## 2016-11-16 MED ORDER — SODIUM CHLORIDE 0.9 % IV SOLN: INTRAVENOUS | Status: DC

## 2016-11-16 MED ORDER — ONDANSETRON HCL 4 MG/2ML IJ SOLN: 4.0000 mg | Freq: Four times a day (QID) | INTRAMUSCULAR | Status: DC | PRN

## 2016-11-16 MED ORDER — ACETAMINOPHEN 325 MG PO TABS
650.0000 mg | ORAL_TABLET | Freq: Four times a day (QID) | ORAL | Status: DC | PRN
  Administered 2016-11-16 (×3): 650 mg via ORAL
  Filled 2016-11-16 (×4): qty 2

## 2016-11-16 MED ORDER — ENOXAPARIN SODIUM 40 MG/0.4ML ~~LOC~~ SOLN
40.0000 mg | SUBCUTANEOUS | Status: DC
  Administered 2016-11-16: 40 mg via SUBCUTANEOUS
  Filled 2016-11-16: qty 0.4

## 2016-11-16 MED ORDER — SODIUM CHLORIDE 0.9 % IV SOLN
1.0000 g | Freq: Three times a day (TID) | INTRAVENOUS | Status: DC
  Filled 2016-11-16 (×3): qty 1

## 2016-11-16 NOTE — Progress Notes (Signed)
Pt lost IV access that ED had placed.  Nursing supervisor was able to get one IV, but it occluded within an hour.  Patient states that she is a hard stick and that ultrasound is usually needed.  She did not receive her antibiotics this am due to no access.  Will notify pharmacy to reschedule times.

## 2016-11-16 NOTE — Progress Notes (Signed)
PHARMACIST - PHYSICIAN COMMUNICATION  CONCERNING:  Enoxaparin (Lovenox) for DVT Prophylaxis    RECOMMENDATION: Patient was prescribed enoxaprin 40mg  q24 hours for VTE prophylaxis.   Filed Weights   11/15/16 2133 11/16/16 0118  Weight: 230 lb 8 oz (104.6 kg) 229 lb 11.2 oz (104.2 kg)    Body mass index is 44.86 kg/m.  Estimated Creatinine Clearance: 85.5 mL/min (by C-G formula based on SCr of 0.77 mg/dL).   Based on [place] patient is candidate for enoxaparin 40mg  every 12 hour dosing due to BMI being >40.   DESCRIPTION: Pharmacy has adjusted enoxaparin dose per [place], approved through [place] committee.  Patient is now receiving enoxaparin 40mg  every 12 hours.    Nancy Fetter, PharmD Clinical Pharmacist  11/16/2016 10:52 AM

## 2016-11-16 NOTE — ED Notes (Signed)
Report called to cynthia rn floor nurse 

## 2016-11-16 NOTE — Progress Notes (Signed)
[facility] [place] NAME: Tammy Turner    MR#:  [id number]  DATE OF BIRTH:  08-21-1960  SUBJECTIVE:   Patient here due to fever/cellulitis of the right chest wall area underneath her breasts. Patient also has burn injury from her radiation therapy. Currently afebrile and hemodynamically stable.  REVIEW OF SYSTEMS:    Review of Systems  Constitutional: Negative for chills and fever.  HENT: Negative for congestion and tinnitus.   Eyes: Negative for blurred vision and double vision.  Respiratory: Negative for cough, shortness of breath and wheezing.   Cardiovascular: Negative for chest pain, orthopnea and PND.  Gastrointestinal: Negative for abdominal pain, diarrhea, nausea and vomiting.  Genitourinary: Negative for dysuria and hematuria.  Skin: Positive for itching and rash (Right under the breast area cellulitis/burn injury).  Neurological: Negative for dizziness, sensory change and focal weakness.  All other systems reviewed and are negative.   Nutrition: heart healthy Tolerating Diet: yes Tolerating PT: Ambulatory   DRUG ALLERGIES:   Allergies  Allergen Reactions  . Morphine Other (See Comments)    Depressed respirations  . Niacin Anaphylaxis  . Doxycycline Hives  . Iodine Hives and Itching  . Ivp Dye [Iodinated Diagnostic Agents] Hives and Itching  . Levaquin [Levofloxacin In D5w] Other (See Comments)    Tendonitis   . Cephalexin Rash  . Penicillins Hives and Rash    Has patient had a PCN reaction causing immediate rash, facial/tongue/throat swelling, SOB or lightheadedness with hypotension: No Has patient had a PCN reaction causing severe rash involving mucus membranes or skin necrosis: No Has patient had a PCN reaction that required hospitalization: No Has patient had a PCN reaction occurring within the last 10 years: Yes If all of the above answers are "NO", then may proceed with Cephalosporin use.     VITALS:   Blood pressure (!) 110/53, pulse 85, temperature 98.1 F (36.7 C), temperature source Oral, resp. rate 18, height 5' (1.524 m), weight 104.2 kg (229 lb 11.2 oz), SpO2 96 %.  PHYSICAL EXAMINATION:   Physical Exam  GENERAL:  56 y.o.-year-old patient lying in bed in no acute distress.  EYES: Pupils equal, round, reactive to light and accommodation. No scleral icterus. Extraocular muscles intact.  HEENT: Head atraumatic, normocephalic. Oropharynx and nasopharynx clear.  NECK:  Supple, no jugular venous distention. No thyroid enlargement, no tenderness.  LUNGS: Normal breath sounds bilaterally, no wheezing, rales, rhonchi. No use of accessory muscles of respiration.  CARDIOVASCULAR: S1, S2 normal. No murmurs, rubs, or gallops.  ABDOMEN: Soft, nontender, nondistended. Bowel sounds present. No organomegaly or mass.  EXTREMITIES: No cyanosis, clubbing or edema b/l.    NEUROLOGIC: Cranial nerves II through XII are intact. No focal Motor or sensory deficits b/l.   PSYCHIATRIC: The patient is alert and oriented x 3.  SKIN: No obvious rash, lesion, or ulcer.  Redness, warmth and skin irritation under the right breast consistent with cellulitis with possible burn injury from radiation treatment.    LABORATORY PANEL:   CBC  Recent Labs Lab 11/16/16 1152  WBC 8.1  HGB 13.0  HCT 37.6  PLT 241   ------------------------------------------------------------------------------------------------------------------  Chemistries   Recent Labs Lab 11/15/16 2212 11/16/16 1152  NA 136 136  K 3.8 3.7  CL 99* 102  CO2 26 24  GLUCOSE 116* 106*  BUN 13 11  CREATININE 0.77 0.69  CALCIUM 9.3 8.7*  AST 22  --   ALT 25  --   ALKPHOS  78  --   BILITOT 1.0  --    ------------------------------------------------------------------------------------------------------------------  Cardiac Enzymes No results for input(s): TROPONINI in the last 168  hours. ------------------------------------------------------------------------------------------------------------------  RADIOLOGY:  No results found.   ASSESSMENT AND PLAN:   56 year old female with past medical history of hypertension, hyperlipidemia, hypothyroidism, history of breast cancer, morbid obesity, chronic anemia, history of migraines, osteoporosis who presents to the hospital due to fever, redness and swelling and warmth underneath her right breast area.  1. Cellulitis underneath the right breast- patient presented with fever, redness swelling and warmth underneath her right breast. This is suspected to be the clinical diagnosis. Patient is afebrile and hemodynamically stable. I will continue IV vancomycin, follow clinically.  2. Essential hypertension-continue losartan/HCTZ.  3. Hypothyroidism-continue Armour thyroid.  4. Hx of OA - cont. Diclofenac.    All the records are reviewed and case discussed with Care Management/Social Worker. Management plans discussed with the patient, family and they are in agreement.  CODE STATUS: Full   DVT Prophylaxis: Lovenox  TOTAL TIME TAKING CARE OF THIS PATIENT: 30 minutes.   POSSIBLE D/C IN 1-2 DAYS, DEPENDING ON CLINICAL CONDITION.   Henreitta Leber M.D on 11/16/2016 at 2:04 PM  Between 7am to 6pm - Pager - [phone]  After 6pm go to [email or web address] - Proofreader  [facility]  Office  [phone]  CC: Primary care physician; Juluis Pitch, MD

## 2016-11-16 NOTE — Telephone Encounter (Signed)
Patient called to report that she is in the hospital and will be there for a few days and will not be here for her XRT appts at [place] in radiation notified

## 2016-11-16 NOTE — Progress Notes (Addendum)
Pharmacy Antibiotic Note  Tammy Turner is a 56 y.o. female admitted on 11/15/2016 with fever s/t radiation therapy for breast cancer.  Pharmacy has been consulted for vanc/metronidazole/aztreonam dosing.  Plan: Patient received vanc 1g and metronidazole 500 mg x 1 in ED  Will continue vanc 1.25g IV q12h w/ 6 hour stack dose. Will check VT 10/11 @ 1230 prior to 4th dose. Will continue metronidazole 500 mg IV q8h. Will give initial dose of aztreonam, but will switch meropenem for broader coverage considering patient has breast cancer Will start meropenem 1g IV q8h  Ke 0.0769 T1/2 9 ~ 12 hrs Goal trough 15 - 20 mcg/mL  Height: 5\' 1"  (154.9 cm) Weight: 230 lb 8 oz (104.6 kg) IBW/kg (Calculated) : 47.8  Temp (24hrs), Avg:99.9 F (37.7 C), Min:99.9 F (37.7 C), Max:99.9 F (37.7 C)   Recent Labs Lab 11/15/16 2211 11/15/16 2212  WBC  --  9.6  CREATININE  --  0.77  LATICACIDVEN 1.4  --     Estimated Creatinine Clearance: 87.4 mL/min (by C-G formula based on SCr of 0.77 mg/dL).    Allergies  Allergen Reactions  . Morphine Other (See Comments)    Depressed respirations  . Niacin Anaphylaxis  . Doxycycline Hives  . Iodine Hives and Itching  . Ivp Dye [Iodinated Diagnostic Agents] Hives and Itching  . Levaquin [Levofloxacin In D5w] Other (See Comments)    Tendonitis   . Cephalexin Rash  . Penicillins Hives and Rash    Has patient had a PCN reaction causing immediate rash, facial/tongue/throat swelling, SOB or lightheadedness with hypotension: No Has patient had a PCN reaction causing severe rash involving mucus membranes or skin necrosis: No Has patient had a PCN reaction that required hospitalization: No Has patient had a PCN reaction occurring within the last 10 years: Yes If all of the above answers are "NO", then may proceed with Cephalosporin use.     Thank you for allowing pharmacy to be a part of this patient's care.  Tammy Turner, PharmD, BCPS Clinical  Pharmacist 11/16/2016  **Rescheduled antibiotics due to no IV access. Please review and adjust times if necessary.   Tammy Turner, [facility] 11/16/2016, 07:47 AM

## 2016-11-17 ENCOUNTER — Ambulatory Visit: Payer: 59

## 2016-11-17 DIAGNOSIS — I10 Essential (primary) hypertension: Secondary | ICD-10-CM | POA: Diagnosis not present

## 2016-11-17 DIAGNOSIS — L0390 Cellulitis, unspecified: Secondary | ICD-10-CM | POA: Diagnosis not present

## 2016-11-17 DIAGNOSIS — A419 Sepsis, unspecified organism: Secondary | ICD-10-CM | POA: Diagnosis not present

## 2016-11-17 LAB — URINE CULTURE: Culture: NO GROWTH

## 2016-11-17 LAB — HIV ANTIBODY (ROUTINE TESTING W REFLEX): HIV SCREEN 4TH GENERATION: NONREACTIVE

## 2016-11-17 MED ORDER — AQUAPHOR EX OINT
TOPICAL_OINTMENT | Freq: Two times a day (BID) | CUTANEOUS | Status: DC | PRN
  Filled 2016-11-17: qty 50

## 2016-11-17 MED ORDER — DOCUSATE SODIUM 100 MG PO CAPS
100.0000 mg | ORAL_CAPSULE | Freq: Two times a day (BID) | ORAL | Status: DC | PRN
  Administered 2016-11-18: 01:00:00 100 mg via ORAL
  Filled 2016-11-17: qty 1

## 2016-11-17 NOTE — Progress Notes (Signed)
[facility] [place] NAME: Tammy Turner    MR#:  [id number]  DATE OF BIRTH:  Dec 30, 1960  SUBJECTIVE:   Patient here due to fever/cellulitis of the right chest wall area underneath her right breasts. Patient also has burn injury from her radiation therapy. Redness and warmth has improved since yesterday. No fever overnight. White cell count normalized.  REVIEW OF SYSTEMS:    Review of Systems  Constitutional: Negative for chills and fever.  HENT: Negative for congestion and tinnitus.   Eyes: Negative for blurred vision and double vision.  Respiratory: Negative for cough, shortness of breath and wheezing.   Cardiovascular: Negative for chest pain, orthopnea and PND.  Gastrointestinal: Negative for abdominal pain, diarrhea, nausea and vomiting.  Genitourinary: Negative for dysuria and hematuria.  Skin: Positive for itching and rash (Right under the breast area cellulitis/burn injury).  Neurological: Negative for dizziness, sensory change and focal weakness.  All other systems reviewed and are negative.   Nutrition: heart healthy Tolerating Diet: yes Tolerating PT: Ambulatory   DRUG ALLERGIES:   Allergies  Allergen Reactions  . Morphine Other (See Comments)    Depressed respirations  . Niacin Anaphylaxis  . Doxycycline Hives  . Iodine Hives and Itching  . Ivp Dye [Iodinated Diagnostic Agents] Hives and Itching  . Levaquin [Levofloxacin In D5w] Other (See Comments)    Tendonitis   . Cephalexin Rash  . Penicillins Hives and Rash    Has patient had a PCN reaction causing immediate rash, facial/tongue/throat swelling, SOB or lightheadedness with hypotension: No Has patient had a PCN reaction causing severe rash involving mucus membranes or skin necrosis: No Has patient had a PCN reaction that required hospitalization: No Has patient had a PCN reaction occurring within the last 10 years: Yes If all of the above answers are "NO", then  may proceed with Cephalosporin use.     VITALS:  Blood pressure (!) 130/54, pulse 87, temperature 98.5 F (36.9 C), temperature source Oral, resp. rate 19, height 5' (1.524 m), weight 104.2 kg (229 lb 11.2 oz), SpO2 94 %.  PHYSICAL EXAMINATION:   Physical Exam  GENERAL:  56 y.o.-year-old patient lying in bed in no acute distress.  EYES: Pupils equal, round, reactive to light and accommodation. No scleral icterus. Extraocular muscles intact.  HEENT: Head atraumatic, normocephalic. Oropharynx and nasopharynx clear.  NECK:  Supple, no jugular venous distention. No thyroid enlargement, no tenderness.  LUNGS: Normal breath sounds bilaterally, no wheezing, rales, rhonchi. No use of accessory muscles of respiration.  CARDIOVASCULAR: S1, S2 normal. No murmurs, rubs, or gallops.  ABDOMEN: Soft, nontender, nondistended. Bowel sounds present. No organomegaly or mass.  EXTREMITIES: No cyanosis, clubbing or edema b/l.    NEUROLOGIC: Cranial nerves II through XII are intact. No focal Motor or sensory deficits b/l.   PSYCHIATRIC: The patient is alert and oriented x 3.  SKIN: No obvious rash, lesion, or ulcer.  Redness, warmth and skin irritation under the right breast consistent with cellulitis with possible burn injury from radiation treatment.    LABORATORY PANEL:   CBC  Recent Labs Lab 11/16/16 1152  WBC 8.1  HGB 13.0  HCT 37.6  PLT 241   ------------------------------------------------------------------------------------------------------------------  Chemistries   Recent Labs Lab 11/15/16 2212 11/16/16 1152  NA 136 136  K 3.8 3.7  CL 99* 102  CO2 26 24  GLUCOSE 116* 106*  BUN 13 11  CREATININE 0.77 0.69  CALCIUM 9.3 8.7*  AST 22  --  ALT 25  --   ALKPHOS 78  --   BILITOT 1.0  --    ------------------------------------------------------------------------------------------------------------------  Cardiac Enzymes No results for input(s): TROPONINI in the last 168  hours. ------------------------------------------------------------------------------------------------------------------  RADIOLOGY:  No results found.   ASSESSMENT AND PLAN:   56 year old female with past medical history of hypertension, hyperlipidemia, hypothyroidism, history of breast cancer, morbid obesity, chronic anemia, history of migraines, osteoporosis who presents to the hospital due to fever, redness and swelling and warmth underneath her right breast area.  1. Cellulitis underneath the right breast- patient presented with fever, redness swelling and warmth underneath her right breast. This is suspected to be the clinical diagnosis. Patient is although afebrile and hemodynamically stable. I will continue IV vancomycin and likely switch over to Oral meds in a.m. And d/c home.  - will start on some Aquaphor to be applied to the radiation burn area.   2. Essential hypertension-continue losartan/HCTZ.  3. Hypothyroidism-continue Armour thyroid.  4. Hx of OA - cont. Diclofenac.    Possible d/c home tomorrow on Oral abx.   All the records are reviewed and case discussed with Care Management/Social Worker. Management plans discussed with the patient, family and they are in agreement.  CODE STATUS: Full   DVT Prophylaxis: Lovenox  TOTAL TIME TAKING CARE OF THIS PATIENT: 25 minutes.   POSSIBLE D/C IN 1-2 DAYS, DEPENDING ON CLINICAL CONDITION.   Henreitta Leber M.D on 11/17/2016 at 2:18 PM  Between 7am to 6pm - Pager - [phone]  After 6pm go to [email or web address] - Proofreader  [facility]  Office  [phone]  CC: Primary care physician; Juluis Pitch, MD

## 2016-11-18 ENCOUNTER — Ambulatory Visit: Payer: 59

## 2016-11-18 DIAGNOSIS — I10 Essential (primary) hypertension: Secondary | ICD-10-CM | POA: Diagnosis not present

## 2016-11-18 DIAGNOSIS — A419 Sepsis, unspecified organism: Secondary | ICD-10-CM | POA: Diagnosis not present

## 2016-11-18 DIAGNOSIS — L0390 Cellulitis, unspecified: Secondary | ICD-10-CM | POA: Diagnosis not present

## 2016-11-18 LAB — VANCOMYCIN, TROUGH: VANCOMYCIN TR: 13 ug/mL — AB (ref 15–20)

## 2016-11-18 MED ORDER — VANCOMYCIN HCL 10 G IV SOLR
1250.0000 mg | Freq: Two times a day (BID) | INTRAVENOUS | Status: DC
  Administered 2016-11-18: 1250 mg via INTRAVENOUS
  Filled 2016-11-18 (×3): qty 1250

## 2016-11-18 NOTE — Progress Notes (Signed)
Pharmacy Antibiotic Note  Tammy Turner is a 56 y.o. female admitted on 11/15/2016 with fever s/t radiation therapy for breast cancer.  Pharmacy has been consulted for vanc/metronidazole/aztreonam dosing.  Plan: Patient received vanc 1g and metronidazole 500 mg x 1 in ED  Will continue vanc 1.25g IV q12h w/ 6 hour stack dose. Will check VT 10/11 @ 1230 prior to 4th dose. Will continue metronidazole 500 mg IV q8h. Will give initial dose of aztreonam, but will switch meropenem for broader coverage considering patient has breast cancer Will start meropenem 1g IV q8h  Ke 0.0769 T1/2 9 ~ 12 hrs Goal trough 15 - 20 mcg/mL  10/12 @ 0031 VT 13 subtherapeutic, but this was drawn 8 hours after previous dose was given. Initial doses were also given later. Will reschedule 1.25g q12h regimen to start @ 0200 10/12 and will recheck VT 10/13 @ 0100 prior to 3rd dose to ensure clearance.  Height: 5' (152.4 cm) Weight: 229 lb 11.2 oz (104.2 kg) IBW/kg (Calculated) : 45.5  Temp (24hrs), Avg:98.1 F (36.7 C), Min:97.8 F (36.6 C), Max:98.5 F (36.9 C)   Recent Labs Lab 11/15/16 2211 11/15/16 2212 11/16/16 1152 11/18/16 0031  WBC  --  9.6 8.1  --   CREATININE  --  0.77 0.69  --   LATICACIDVEN 1.4  --   --   --   VANCOTROUGH  --   --   --  13*    Estimated Creatinine Clearance: 85.5 mL/min (by C-G formula based on SCr of 0.69 mg/dL).    Allergies  Allergen Reactions  . Morphine Other (See Comments)    Depressed respirations  . Niacin Anaphylaxis  . Doxycycline Hives  . Iodine Hives and Itching  . Ivp Dye [Iodinated Diagnostic Agents] Hives and Itching  . Levaquin [Levofloxacin In D5w] Other (See Comments)    Tendonitis   . Cephalexin Rash  . Penicillins Hives and Rash    Has patient had a PCN reaction causing immediate rash, facial/tongue/throat swelling, SOB or lightheadedness with hypotension: No Has patient had a PCN reaction causing severe rash involving mucus membranes or skin  necrosis: No Has patient had a PCN reaction that required hospitalization: No Has patient had a PCN reaction occurring within the last 10 years: Yes If all of the above answers are "NO", then may proceed with Cephalosporin use.     Thank you for allowing pharmacy to be a part of this patient's care.  [name], PharmD, BCPS Clinical Pharmacist 11/18/2016  **Rescheduled antibiotics due to no IV access. Please review and adjust times if necessary.   [name], [facility] 11/18/2016, 07:47 AM
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
## 2016-11-20 LAB — CULTURE, BLOOD (ROUTINE X 2)
CULTURE: NO GROWTH
CULTURE: NO GROWTH
Special Requests: ADEQUATE

## 2016-11-21 ENCOUNTER — Ambulatory Visit: Payer: 59

## 2016-11-21 DIAGNOSIS — I472 Ventricular tachycardia: Secondary | ICD-10-CM | POA: Diagnosis not present

## 2016-11-21 DIAGNOSIS — Z8041 Family history of malignant neoplasm of ovary: Secondary | ICD-10-CM | POA: Diagnosis not present

## 2016-11-21 DIAGNOSIS — R319 Hematuria, unspecified: Secondary | ICD-10-CM | POA: Diagnosis not present

## 2016-11-21 DIAGNOSIS — R51 Headache: Secondary | ICD-10-CM | POA: Diagnosis not present

## 2016-11-21 DIAGNOSIS — M8580 Other specified disorders of bone density and structure, unspecified site: Secondary | ICD-10-CM | POA: Diagnosis not present

## 2016-11-21 DIAGNOSIS — Z79899 Other long term (current) drug therapy: Secondary | ICD-10-CM | POA: Diagnosis not present

## 2016-11-21 DIAGNOSIS — D649 Anemia, unspecified: Secondary | ICD-10-CM | POA: Diagnosis not present

## 2016-11-21 DIAGNOSIS — Z170 Estrogen receptor positive status [ER+]: Secondary | ICD-10-CM | POA: Diagnosis not present

## 2016-11-21 DIAGNOSIS — G4730 Sleep apnea, unspecified: Secondary | ICD-10-CM | POA: Diagnosis not present

## 2016-11-21 DIAGNOSIS — E039 Hypothyroidism, unspecified: Secondary | ICD-10-CM | POA: Diagnosis not present

## 2016-11-21 DIAGNOSIS — Z510 Encounter for antineoplastic radiation therapy: Secondary | ICD-10-CM | POA: Diagnosis not present

## 2016-11-21 DIAGNOSIS — Z800 Family history of malignant neoplasm of digestive organs: Secondary | ICD-10-CM | POA: Diagnosis not present

## 2016-11-21 DIAGNOSIS — E785 Hyperlipidemia, unspecified: Secondary | ICD-10-CM | POA: Diagnosis not present

## 2016-11-21 DIAGNOSIS — Z8669 Personal history of other diseases of the nervous system and sense organs: Secondary | ICD-10-CM | POA: Diagnosis not present

## 2016-11-21 DIAGNOSIS — C50411 Malignant neoplasm of upper-outer quadrant of right female breast: Secondary | ICD-10-CM | POA: Diagnosis not present

## 2016-11-21 DIAGNOSIS — E669 Obesity, unspecified: Secondary | ICD-10-CM | POA: Diagnosis not present

## 2016-11-21 DIAGNOSIS — R0602 Shortness of breath: Secondary | ICD-10-CM | POA: Diagnosis not present

## 2016-11-21 DIAGNOSIS — D510 Vitamin B12 deficiency anemia due to intrinsic factor deficiency: Secondary | ICD-10-CM | POA: Diagnosis not present

## 2016-11-21 DIAGNOSIS — R55 Syncope and collapse: Secondary | ICD-10-CM | POA: Diagnosis not present

## 2016-11-21 DIAGNOSIS — I10 Essential (primary) hypertension: Secondary | ICD-10-CM | POA: Diagnosis not present

## 2016-11-22 ENCOUNTER — Ambulatory Visit: Payer: 59

## 2016-11-22 ENCOUNTER — Inpatient Hospital Stay: Payer: 59

## 2016-11-22 DIAGNOSIS — C50411 Malignant neoplasm of upper-outer quadrant of right female breast: Secondary | ICD-10-CM

## 2016-11-22 DIAGNOSIS — Z170 Estrogen receptor positive status [ER+]: Principal | ICD-10-CM

## 2016-11-22 LAB — CBC
HCT: 39.4 % (ref 35.0–47.0)
Hemoglobin: 13.5 g/dL (ref 12.0–16.0)
MCH: 29.8 pg (ref 26.0–34.0)
MCHC: 34.3 g/dL (ref 32.0–36.0)
MCV: 86.9 fL (ref 80.0–100.0)
PLATELETS: 235 10*3/uL (ref 150–440)
RBC: 4.54 MIL/uL (ref 3.80–5.20)
RDW: 14.2 % (ref 11.5–14.5)
WBC: 4.3 10*3/uL (ref 3.6–11.0)

## 2016-11-23 ENCOUNTER — Ambulatory Visit: Payer: 59

## 2016-11-23 ENCOUNTER — Ambulatory Visit
Admission: RE | Admit: 2016-11-23 | Discharge: 2016-11-23 | Disposition: A | Discharge status: Home / Self Care | Payer: 59 | Source: Ambulatory Visit | Attending: Radiation Oncology | Admitting: Radiation Oncology

## 2016-11-23 DIAGNOSIS — C50411 Malignant neoplasm of upper-outer quadrant of right female breast: Secondary | ICD-10-CM | POA: Diagnosis not present

## 2016-11-24 ENCOUNTER — Ambulatory Visit: Payer: 59

## 2016-11-25 ENCOUNTER — Encounter: Payer: Self-pay | Admitting: Emergency Medicine

## 2016-11-25 ENCOUNTER — Ambulatory Visit: Payer: 59

## 2016-11-25 ENCOUNTER — Emergency Department: Payer: 59

## 2016-11-25 ENCOUNTER — Emergency Department
Admission: EM | Admit: 2016-11-25 | Discharge: 2016-11-25 | Disposition: A | Discharge status: Home / Self Care | Payer: 59 | Attending: Emergency Medicine | Admitting: Emergency Medicine

## 2016-11-25 DIAGNOSIS — Z79899 Other long term (current) drug therapy: Secondary | ICD-10-CM | POA: Diagnosis not present

## 2016-11-25 DIAGNOSIS — N644 Mastodynia: Secondary | ICD-10-CM | POA: Diagnosis not present

## 2016-11-25 DIAGNOSIS — R509 Fever, unspecified: Secondary | ICD-10-CM | POA: Diagnosis not present

## 2016-11-25 DIAGNOSIS — E039 Hypothyroidism, unspecified: Secondary | ICD-10-CM | POA: Insufficient documentation

## 2016-11-25 DIAGNOSIS — I10 Essential (primary) hypertension: Secondary | ICD-10-CM | POA: Insufficient documentation

## 2016-11-25 DIAGNOSIS — Z170 Estrogen receptor positive status [ER+]: Secondary | ICD-10-CM | POA: Insufficient documentation

## 2016-11-25 DIAGNOSIS — C50411 Malignant neoplasm of upper-outer quadrant of right female breast: Secondary | ICD-10-CM | POA: Diagnosis not present

## 2016-11-25 DIAGNOSIS — N6489 Other specified disorders of breast: Secondary | ICD-10-CM | POA: Diagnosis not present

## 2016-11-25 LAB — CBC WITH DIFFERENTIAL/PLATELET
Basophils Absolute: 0.1 10*3/uL (ref 0–0.1)
Basophils Relative: 2 %
EOS ABS: 0.2 10*3/uL (ref 0–0.7)
EOS PCT: 4 %
HCT: 39.7 % (ref 35.0–47.0)
Hemoglobin: 13.9 g/dL (ref 12.0–16.0)
LYMPHS ABS: 0.7 10*3/uL — AB (ref 1.0–3.6)
LYMPHS PCT: 17 %
MCH: 30.4 pg (ref 26.0–34.0)
MCHC: 35 g/dL (ref 32.0–36.0)
MCV: 86.7 fL (ref 80.0–100.0)
MONO ABS: 0.5 10*3/uL (ref 0.2–0.9)
Monocytes Relative: 14 %
Neutro Abs: 2.4 10*3/uL (ref 1.4–6.5)
Neutrophils Relative %: 63 %
PLATELETS: 198 10*3/uL (ref 150–440)
RBC: 4.57 MIL/uL (ref 3.80–5.20)
RDW: 14.4 % (ref 11.5–14.5)
WBC: 3.8 10*3/uL (ref 3.6–11.0)

## 2016-11-25 LAB — COMPREHENSIVE METABOLIC PANEL
ALT: 30 U/L (ref 14–54)
ANION GAP: 8 (ref 5–15)
AST: 17 U/L (ref 15–41)
Albumin: 3.9 g/dL (ref 3.5–5.0)
Alkaline Phosphatase: 75 U/L (ref 38–126)
BUN: 19 mg/dL (ref 6–20)
CHLORIDE: 105 mmol/L (ref 101–111)
CO2: 24 mmol/L (ref 22–32)
CREATININE: 0.82 mg/dL (ref 0.44–1.00)
Calcium: 9 mg/dL (ref 8.9–10.3)
Glucose, Bld: 108 mg/dL — ABNORMAL HIGH (ref 65–99)
POTASSIUM: 4.1 mmol/L (ref 3.5–5.1)
SODIUM: 137 mmol/L (ref 135–145)
Total Bilirubin: 0.6 mg/dL (ref 0.3–1.2)
Total Protein: 7.3 g/dL (ref 6.5–8.1)

## 2016-11-25 LAB — URINALYSIS, ROUTINE W REFLEX MICROSCOPIC
BACTERIA UA: NONE SEEN
BILIRUBIN URINE: NEGATIVE
GLUCOSE, UA: NEGATIVE mg/dL
Ketones, ur: NEGATIVE mg/dL
LEUKOCYTES UA: NEGATIVE
NITRITE: NEGATIVE
PROTEIN: NEGATIVE mg/dL
Specific Gravity, Urine: 1.017 (ref 1.005–1.030)
pH: 5 (ref 5.0–8.0)

## 2016-11-25 LAB — SEDIMENTATION RATE: Sed Rate: 1 mm/hr (ref 0–30)

## 2016-11-25 NOTE — Discharge Instructions (Signed)
Please call your doctor today to inform them of today's ER visit and your fever.  Return to the emergency department for any personally concerning symptoms such as continued high fever, abdominal pain, chest pain.  Otherwise please follow-up with your doctor in the next several days for recheck/reevaluation.

## 2016-11-25 NOTE — ED Triage Notes (Signed)
Patient ambulatory to triage with steady gait, without difficulty or distress noted; pt reports recently admitted for sepsis; currently receiving radiation for breast CA; temp 100.9 at home PTA

## 2016-11-25 NOTE — ED Notes (Signed)
Unsuccessful iv attempts by this RN x2. edp notified and will attempt via [place].

## 2016-11-25 NOTE — ED Provider Notes (Signed)
[facility] Emergency Department Provider Note  Time seen: 7:06 AM  I have reviewed the triage vital signs and the nursing notes.   HISTORY  Chief Complaint Fever    HPI Tammy Turner is a 56 y.o. female With a past medical history of breast cancer currently receiving radiation treatments who presents to the emergency department for a fever. Patient was recently admitted to the hospital for fever thought to be due to right breast cellulitis. Patient received IV antibiotics, was discharged on Bactrim. States she continues to take the Bactrim but is once again started spiking fevers as high as 101 today. Currently 100.6 in the emergency department. Patient continues to state right breast pain, denies any cough, congestion, nausea, vomiting, diarrhea, dysuria.  Past Medical History:  Diagnosis Date  . Allergic state   . Anemia   . Breast cancer of upper-outer quadrant of right female breast ([place]) 06/2016   ER 95%, PR 90%, HER-2/neu not overexpressed.  . Complication of anesthesia    SEVERE MUSCLE ACHES FROM DIAPHRAGM TO THIGHS X 2 DAYS AFTER PARTIAL MASTECTOMY ON 07-25-16-PT WAS GIVEN SUCCINYLCHOLINE FOR INDUCTION  . Dyspnea on exertion    a. 09/2013 Echo: EF 60-65%, no rwma;  b. 11/2013 Myoview: EF 64%, no ischemia; c. 09/2016 Echo: EF 55-60%, no rwma.  . Family history of ovarian cancer    Pt is My Risk cancer genetic testing neg 2016  . Genetic testing 2016   My Risk negative  . Gross hematuria   . Headache(784.0)   . Hyperlipidemia   . Hypertension   . Hypothyroidism   . Migraines   . Morbid obesity ([place])   . Osteopenia   . Osteopenia   . Pernicious anemia   . Sleep apnea 2010   uses CPAP  . Syncope    a. 09/2016 - seen in ED - limited w/u unrevealing; b. 09/2016 Echo: eF 55-60%, no rwma;  b. 10/2016 Event monitor:  no signficant arrhythmias. Occas pac's.    Patient Active Problem List   Diagnosis Date Noted  . Sepsis ([place]) 11/15/2016  . Cellulitis  11/15/2016  . Sleep apnea 11/15/2016  . Hypothyroidism 11/15/2016  . Heart murmur 09/21/2016  . Seroma of breast 08/17/2016  . Malignant neoplasm of upper-outer quadrant of right breast in female, estrogen receptor positive ([place]) 07/12/2016  . Morbid obesity ([place]) 11/12/2013  . Hypertension     Past Surgical History:  Procedure Laterality Date  . ABDOMINAL HYSTERECTOMY  2011   [place]; Dr. Laurey Morale, due to leio/adenomyosis  . BREAST LUMPECTOMY Right 08/08/2016   Procedure: BREAST RE-EXCISION;  Surgeon: Robert Bellow, MD;  Location: [facility] ORS;  Service: General;  Laterality: Right;  . CESAREAN SECTION     x 2  . CHOLECYSTECTOMY    . COLONOSCOPY    . COLONOSCOPY WITH PROPOFOL N/A 10/05/2015   Procedure: COLONOSCOPY WITH PROPOFOL;  Surgeon: Manya Silvas, MD;  Location: [facility] ENDOSCOPY;  Service: Endoscopy;  Laterality: N/A;  . DILATION AND CURETTAGE OF UTERUS    . FUNCTIONAL ENDOSCOPIC SINUS SURGERY    . PARTIAL MASTECTOMY WITH AXILLARY SENTINEL LYMPH NODE BIOPSY Right 07/25/2016   Procedure: PARTIAL MASTECTOMY WITH AXILLARY SENTINEL LYMPH NODE BIOPSY;  Surgeon: Robert Bellow, MD;  Location: [facility] ORS;  Service: General;  Laterality: Right;    Prior to Admission medications   Medication Sig Start Date End Date Taking? Authorizing Provider  ASCORBIC ACID PO Take 2 tablets by mouth every evening.    [provider]  Carboxymethylcellulose Sodium (THERATEARS OP) Place 1 drop into both eyes 2 (two) times daily as needed (dry eyes).    [provider]  cholecalciferol (VITAMIN D) 1000 units tablet Take 1,000 Units by mouth every evening.    [provider]  Cyanocobalamin (VITAMIN B-12 IJ) Inject 1,000 mcg as directed every 30 (thirty) days.    [provider]  diclofenac (VOLTAREN) 50 MG EC tablet Take 50 mg by mouth 2 (two) times daily.     [provider]  fluticasone (FLONASE) 50 MCG/ACT nasal spray Place 2 sprays into both nostrils daily.     [provider]  losartan-hydrochlorothiazide (HYZAAR) 100-25 MG tablet TAKE 1 TABLET BY MOUTH EVERY DAY 09/26/16   [provider]  silver sulfADIAZINE (SILVADENE) 1 % cream Apply 1 application topically daily. 11/07/16   Noreene Filbert, MD  sulfamethoxazole-trimethoprim (BACTRIM DS,SEPTRA DS) 800-160 MG tablet Take 1 tablet by mouth 2 (two) times daily. 11/15/16 11/25/16  [provider]  thyroid (ARMOUR) 90 MG tablet Take 90 mg by mouth daily.    [provider]    Allergies  Allergen Reactions  . Morphine Other (See Comments)    Depressed respirations  . Niacin Anaphylaxis  . Doxycycline Hives  . Iodine Hives and Itching  . Ivp Dye [Iodinated Diagnostic Agents] Hives and Itching  . Levaquin [Levofloxacin In D5w] Other (See Comments)    Tendonitis   . Cephalexin Rash  . Penicillins Hives and Rash    Has patient had a PCN reaction causing immediate rash, facial/tongue/throat swelling, SOB or lightheadedness with hypotension: No Has patient had a PCN reaction causing severe rash involving mucus membranes or skin necrosis: No Has patient had a PCN reaction that required hospitalization: No Has patient had a PCN reaction occurring within the last 10 years: Yes If all of the above answers are "NO", then may proceed with Cephalosporin use.     Family History  Problem Relation Age of Onset  . Pulmonary embolism Mother   . Hypertension Mother   . Hyperlipidemia Mother   . Hypertension Father   . Ovarian cancer Maternal Grandmother        92  . Colon cancer Paternal Grandmother        80  . Breast cancer Neg Hx     Social History Social History  Substance Use Topics  . Smoking status: Never Smoker  . Smokeless tobacco: Never Used  . Alcohol use No    Review of Systems Constitutional: positive for fever ENT: Negative for congestion Cardiovascular: Negative for chest pain. Respiratory: Negative for shortness of  breath. Gastrointestinal: Negative for abdominal pain, vomiting and diarrhea. Genitourinary: Negative for dysuria. Musculoskeletal: positive for right breast pain Skin: improving rash under right breast All other ROS negative  ____________________________________________   PHYSICAL EXAM:  VITAL SIGNS: ED Triage Vitals  Enc Vitals Group     BP 11/25/16 0651 (!) 150/115     Pulse Rate 11/25/16 0651 93     Resp 11/25/16 0651 14     Temp 11/25/16 0651 (!) 100.6 F (38.1 C)     Temp Source 11/25/16 0651 Oral     SpO2 11/25/16 0651 96 %     Weight 11/25/16 0645 220 lb (99.8 kg)     Height 11/25/16 0645 _0  (1.549 m)     Head Circumference --      Peak Flow --      Pain Score 11/25/16 0644 9  Pain Loc --      Pain Edu? --      Excl. in [place]? --     Constitutional: Alert and oriented. Well appearing and in no distress. Eyes: Normal exam ENT   Head: Normocephalic and atraumatic.   Mouth/Throat: Mucous membranes are moist. Cardiovascular: Normal rate, regular rhythm. No murmur Respiratory: Normal respiratory effort without tachypnea nor retractions. Breath sounds are clear Gastrointestinal: Soft and nontender. No distention.  Musculoskeletal: Nontender with normal range of motion in all extremities. No lower extremity tenderness or edema. Neurologic:  Normal speech and language. No gross focal neurologic deficits Skin:  Skin is warm, dry. Rash to this previously under the right breast has significantly improved, currently no erythema, no weeping. Psychiatric: Mood and affect are normal.   ____________________________________________   EKG reviewed and interpreted by myself shows normal sinus rhythm at 85 bpm, narrow QRS, normal axis, normal intervals, nonspecific ST changes but no ST elevation.  RADIOLOGY  Ultrasound negative for abscess Chest x-ray normal  ____________________________________________   INITIAL IMPRESSION / ASSESSMENT AND PLAN / ED  COURSE  Pertinent labs & imaging results that were available during my care of the patient were reviewed by me and considered in my medical decision making (see chart for details).  the patient presents to the emergency department with a fever. Patient was recently admitted for right breast cellulitis. Differential this time would include infectious etiology such as continued cellulitis, abscess, upper respiratory infection, urinary tract infection, viral infection. We'll check labs, urinalysis, attempt to obtain a right breast ultrasound to rule out abscess. Patient states a history of right breast abscess in July, states mild right breast pain. No palpable abscess mass or erythema noted.I reviewed the patient's records including recent discharge summary for right breast cellulitis. I saw the patient previously in the emergency department for this admission, overall she appears much improved, she has no symptoms besides fever.  Patient's results are normal.  Labs are normal including normal white blood cell count with a normal differential.  Urinalysis is normal.  Metabolic panel is normal.  Chest x-ray is normal.  Ultrasound of the right breast shows no abscess.  We will discharge the patient home.  She states last time she took Bactrim she developed low-grade fevers and joint pain.  Patient has multiple medication reactions in the past.  Given her normal workup I discussed with the patient stopping the antibiotic and following up with her doctor.  Also provided return precautions.  However as the patient's workup is normal I believe she is safe for discharge home with PCP follow-up.  ____________________________________________   FINAL CLINICAL IMPRESSION(S) / ED DIAGNOSES  fever    Harvest Dark, MD 11/25/16 1046

## 2016-11-25 NOTE — ED Notes (Signed)
Pt wheeled to POV in wheelchair. NAD. VSS. Skin pink warm and dry. Pt voiced no needs or concerned when discharged.

## 2016-11-27 LAB — URINE CULTURE

## 2016-11-28 ENCOUNTER — Ambulatory Visit: Payer: 59

## 2016-11-28 DIAGNOSIS — C50411 Malignant neoplasm of upper-outer quadrant of right female breast: Secondary | ICD-10-CM | POA: Diagnosis not present

## 2016-11-28 NOTE — Progress Notes (Signed)
ED Antimicrobial Stewardship Positive Culture Follow Up   Tammy Turner is an 56 y.o. female recently discharged from [facility] on Septra for breast cellulitis who presented to [facility] on 11/25/2016 with a chief complaint of fever. Discharging physician instructed patient to stop Septra due to possibility of drug reaction as patient reported having reaction to Septra in the past.  Chief Complaint  Patient presents with  . Fever    Recent Results (from the past 720 hour(s))  Blood Culture (routine x 2)     Status: None   Collection Time: 11/15/16 10:11 PM  Result Value Ref Range Status   Specimen Description LEFT ANTECUBITAL  Final   Special Requests   Final    BOTTLES DRAWN AEROBIC AND ANAEROBIC Blood Culture adequate volume   Culture NO GROWTH 5 DAYS  Final   Report Status 11/20/2016 FINAL  Final  Blood Culture (routine x 2)     Status: None   Collection Time: 11/15/16 10:12 PM  Result Value Ref Range Status   Specimen Description BLOOD LEFT ARM  Final   Special Requests   Final    BOTTLES DRAWN AEROBIC AND ANAEROBIC Blood Culture results may not be optimal due to an excessive volume of blood received in culture bottles   Culture NO GROWTH 5 DAYS  Final   Report Status 11/20/2016 FINAL  Final  Urine culture     Status: None   Collection Time: 11/15/16 10:12 PM  Result Value Ref Range Status   Specimen Description   Final   Special Requests URINE  Final   Culture   Final    NO GROWTH Performed at [facility], [address], [place], [place] [address]    Report Status 11/17/2016 FINAL  Final  Urine culture     Status: Abnormal   Collection Time: 11/25/16  7:42 AM  Result Value Ref Range Status   Specimen Description URINE, RANDOM  Final   Special Requests NONE  Final   Culture 20,000 COLONIES/mL ENTEROBACTER CLOACAE (A)  Final   Report Status 11/27/2016 FINAL  Final   Organism ID, Bacteria ENTEROBACTER CLOACAE (A)  Final      Susceptibility   Enterobacter cloacae -  MIC*    CEFAZOLIN >=64 RESISTANT Resistant     CEFTRIAXONE <=1 SENSITIVE Sensitive     CIPROFLOXACIN <=0.25 SENSITIVE Sensitive     GENTAMICIN <=1 SENSITIVE Sensitive     IMIPENEM <=0.25 SENSITIVE Sensitive     NITROFURANTOIN 64 INTERMEDIATE Intermediate     TRIMETH/SULFA >=320 RESISTANT Resistant     PIP/TAZO <=4 SENSITIVE Sensitive     * 20,000 COLONIES/mL ENTEROBACTER CLOACAE  Blood culture (routine x 2)     Status: None (Preliminary result)   Collection Time: 11/25/16  7:45 AM  Result Value Ref Range Status   Specimen Description BLOOD LEFT AC  Final   Special Requests   Final    BOTTLES DRAWN AEROBIC AND ANAEROBIC Blood Culture results may not be optimal due to an excessive volume of blood received in culture bottles   Culture NO GROWTH 3 DAYS  Final   Report Status PENDING  Incomplete  Blood culture (routine x 2)     Status: None (Preliminary result)   Collection Time: 11/25/16  7:50 AM  Result Value Ref Range Status   Specimen Description BLOOD LEFT HAND  Final   Special Requests   Final    BOTTLES DRAWN AEROBIC AND ANAEROBIC Blood Culture results may not be optimal due  to an excessive volume of blood received in culture bottles   Culture NO GROWTH 3 DAYS  Final   Report Status PENDING  Incomplete    [x]  Patient discharged originally without antimicrobial agent. Urine culture resulted with 20k colonies Enterobacter cloacae. Upon speaking with the patient, fever resolved 10/20 after stopping Septra 10/19 and patient continues to be asymptomatic.   New antibiotic prescription: None  ED Provider: Dr. Maren Reamer, Amar Sippel D 11/28/2016, 12:56 PM

## 2016-11-29 ENCOUNTER — Ambulatory Visit: Payer: 59

## 2016-11-30 ENCOUNTER — Ambulatory Visit: Payer: 59

## 2016-11-30 DIAGNOSIS — C50411 Malignant neoplasm of upper-outer quadrant of right female breast: Secondary | ICD-10-CM | POA: Diagnosis not present

## 2016-11-30 LAB — CULTURE, BLOOD (ROUTINE X 2)
CULTURE: NO GROWTH
Culture: NO GROWTH

## 2016-12-01 ENCOUNTER — Ambulatory Visit: Payer: 59

## 2016-12-01 ENCOUNTER — Ambulatory Visit
Admission: RE | Admit: 2016-12-01 | Discharge: 2016-12-01 | Disposition: A | Discharge status: Home / Self Care | Payer: 59 | Source: Ambulatory Visit | Attending: Radiation Oncology | Admitting: Radiation Oncology

## 2016-12-01 DIAGNOSIS — C50411 Malignant neoplasm of upper-outer quadrant of right female breast: Secondary | ICD-10-CM | POA: Diagnosis not present

## 2016-12-02 ENCOUNTER — Ambulatory Visit: Payer: 59

## 2016-12-02 ENCOUNTER — Ambulatory Visit
Admission: RE | Admit: 2016-12-02 | Discharge: 2016-12-02 | Disposition: A | Discharge status: Home / Self Care | Payer: 59 | Source: Ambulatory Visit | Attending: Radiation Oncology | Admitting: Radiation Oncology

## 2016-12-02 DIAGNOSIS — C50411 Malignant neoplasm of upper-outer quadrant of right female breast: Secondary | ICD-10-CM | POA: Diagnosis not present

## 2016-12-02 DIAGNOSIS — Z170 Estrogen receptor positive status [ER+]: Secondary | ICD-10-CM | POA: Diagnosis not present

## 2016-12-05 ENCOUNTER — Ambulatory Visit
Admission: RE | Admit: 2016-12-05 | Discharge: 2016-12-05 | Disposition: A | Discharge status: Home / Self Care | Payer: 59 | Source: Ambulatory Visit | Attending: Radiation Oncology | Admitting: Radiation Oncology

## 2016-12-05 ENCOUNTER — Ambulatory Visit: Payer: 59

## 2016-12-05 DIAGNOSIS — I10 Essential (primary) hypertension: Secondary | ICD-10-CM | POA: Diagnosis not present

## 2016-12-05 DIAGNOSIS — E039 Hypothyroidism, unspecified: Secondary | ICD-10-CM | POA: Diagnosis not present

## 2016-12-05 DIAGNOSIS — C50411 Malignant neoplasm of upper-outer quadrant of right female breast: Secondary | ICD-10-CM | POA: Diagnosis not present

## 2016-12-05 DIAGNOSIS — E538 Deficiency of other specified B group vitamins: Secondary | ICD-10-CM | POA: Diagnosis not present

## 2016-12-06 ENCOUNTER — Other Ambulatory Visit: Payer: Self-pay | Admitting: *Deleted

## 2016-12-06 ENCOUNTER — Ambulatory Visit: Payer: 59

## 2016-12-06 DIAGNOSIS — C50411 Malignant neoplasm of upper-outer quadrant of right female breast: Secondary | ICD-10-CM | POA: Diagnosis not present

## 2016-12-07 ENCOUNTER — Ambulatory Visit: Payer: 59

## 2016-12-07 ENCOUNTER — Ambulatory Visit
Admission: RE | Admit: 2016-12-07 | Discharge: 2016-12-07 | Disposition: A | Discharge status: Home / Self Care | Payer: 59 | Source: Ambulatory Visit | Attending: Radiation Oncology | Admitting: Radiation Oncology

## 2016-12-07 DIAGNOSIS — C50411 Malignant neoplasm of upper-outer quadrant of right female breast: Secondary | ICD-10-CM | POA: Diagnosis not present

## 2016-12-08 ENCOUNTER — Ambulatory Visit
Admission: RE | Admit: 2016-12-08 | Discharge: 2016-12-08 | Disposition: A | Discharge status: Home / Self Care | Payer: 59 | Source: Ambulatory Visit | Attending: Radiation Oncology | Admitting: Radiation Oncology

## 2016-12-08 ENCOUNTER — Ambulatory Visit: Payer: 59

## 2016-12-08 DIAGNOSIS — C50411 Malignant neoplasm of upper-outer quadrant of right female breast: Secondary | ICD-10-CM | POA: Diagnosis not present

## 2016-12-09 ENCOUNTER — Ambulatory Visit
Admission: RE | Admit: 2016-12-09 | Discharge: 2016-12-09 | Disposition: A | Discharge status: Home / Self Care | Payer: 59 | Source: Ambulatory Visit | Attending: Radiation Oncology | Admitting: Radiation Oncology

## 2016-12-09 ENCOUNTER — Ambulatory Visit: Payer: 59

## 2016-12-09 DIAGNOSIS — C50411 Malignant neoplasm of upper-outer quadrant of right female breast: Secondary | ICD-10-CM | POA: Diagnosis not present

## 2016-12-09 DIAGNOSIS — Z170 Estrogen receptor positive status [ER+]: Secondary | ICD-10-CM | POA: Diagnosis not present

## 2016-12-12 ENCOUNTER — Ambulatory Visit: Payer: 59

## 2016-12-12 ENCOUNTER — Ambulatory Visit
Admission: RE | Admit: 2016-12-12 | Discharge: 2016-12-12 | Disposition: A | Discharge status: Home / Self Care | Payer: 59 | Source: Ambulatory Visit | Attending: Radiation Oncology | Admitting: Radiation Oncology

## 2016-12-12 DIAGNOSIS — C50411 Malignant neoplasm of upper-outer quadrant of right female breast: Secondary | ICD-10-CM | POA: Diagnosis not present

## 2016-12-13 ENCOUNTER — Ambulatory Visit
Admission: RE | Admit: 2016-12-13 | Discharge: 2016-12-13 | Disposition: A | Discharge status: Home / Self Care | Payer: 59 | Source: Ambulatory Visit | Attending: Radiation Oncology | Admitting: Radiation Oncology

## 2016-12-13 DIAGNOSIS — C50411 Malignant neoplasm of upper-outer quadrant of right female breast: Secondary | ICD-10-CM | POA: Diagnosis not present

## 2016-12-14 ENCOUNTER — Ambulatory Visit: Payer: 59

## 2016-12-14 ENCOUNTER — Ambulatory Visit
Admission: RE | Admit: 2016-12-14 | Discharge: 2016-12-14 | Disposition: A | Discharge status: Home / Self Care | Payer: 59 | Source: Ambulatory Visit | Attending: Radiation Oncology | Admitting: Radiation Oncology

## 2016-12-14 DIAGNOSIS — C50411 Malignant neoplasm of upper-outer quadrant of right female breast: Secondary | ICD-10-CM | POA: Diagnosis not present

## 2016-12-15 ENCOUNTER — Ambulatory Visit
Admission: RE | Admit: 2016-12-15 | Discharge: 2016-12-15 | Disposition: A | Discharge status: Home / Self Care | Payer: 59 | Source: Ambulatory Visit | Attending: Radiation Oncology | Admitting: Radiation Oncology

## 2016-12-15 ENCOUNTER — Ambulatory Visit: Payer: 59

## 2016-12-15 DIAGNOSIS — C50411 Malignant neoplasm of upper-outer quadrant of right female breast: Secondary | ICD-10-CM | POA: Diagnosis not present

## 2016-12-16 ENCOUNTER — Ambulatory Visit
Admission: RE | Admit: 2016-12-16 | Discharge: 2016-12-16 | Disposition: A | Discharge status: Home / Self Care | Payer: 59 | Source: Ambulatory Visit | Attending: Radiation Oncology | Admitting: Radiation Oncology

## 2016-12-16 DIAGNOSIS — C50411 Malignant neoplasm of upper-outer quadrant of right female breast: Secondary | ICD-10-CM | POA: Diagnosis not present

## 2016-12-16 DIAGNOSIS — Z170 Estrogen receptor positive status [ER+]: Secondary | ICD-10-CM | POA: Diagnosis not present

## 2016-12-19 ENCOUNTER — Ambulatory Visit
Admission: RE | Admit: 2016-12-19 | Discharge: 2016-12-19 | Disposition: A | Discharge status: Home / Self Care | Payer: 59 | Source: Ambulatory Visit | Attending: Radiation Oncology | Admitting: Radiation Oncology

## 2016-12-19 DIAGNOSIS — C50411 Malignant neoplasm of upper-outer quadrant of right female breast: Secondary | ICD-10-CM | POA: Diagnosis not present

## 2016-12-21 ENCOUNTER — Ambulatory Visit (INDEPENDENT_AMBULATORY_CARE_PROVIDER_SITE_OTHER): Payer: 59 | Admitting: General Surgery

## 2016-12-21 ENCOUNTER — Encounter: Payer: Self-pay | Admitting: General Surgery

## 2016-12-21 VITALS — BP 134/72 | HR 74 | Resp 12 | Ht 61.0 in | Wt 223.0 lb

## 2016-12-21 DIAGNOSIS — C50411 Malignant neoplasm of upper-outer quadrant of right female breast: Secondary | ICD-10-CM

## 2016-12-21 DIAGNOSIS — Z170 Estrogen receptor positive status [ER+]: Secondary | ICD-10-CM

## 2016-12-21 MED ORDER — LETROZOLE 2.5 MG PO TABS: 2.5000 mg | ORAL_TABLET | Freq: Every day | ORAL | 4 refills | Status: DC

## 2016-12-21 NOTE — Patient Instructions (Signed)
Patient for bone density test in 2019 and sent [facility] to [facility]. Patient to call in one month and report how she is doing on the Femara.   Patient to return in six months for bilateral diagnotic mammogram. The patient is aware to call back for any questions or concerns.

## 2016-12-21 NOTE — Progress Notes (Signed)
Patient ID: Tammy Turner, female   DOB: 06-05-1960, 56 y.o.   MRN: [id number]  Chief Complaint  Patient presents with  . Breast Cancer    HPI Tammy Turner is a 56 y.o. female here today for her follow up breast cancer check up. Patient states her last treatment was on 12/19/2016.  Hospitalized in October 2018 with sepsis.No organism identified.  Source not identified. HPI  Past Medical History:  Diagnosis Date  . Allergic state   . Anemia   . Breast cancer of upper-outer quadrant of right female breast ([place]) 06/2016   ER 95%, PR 90%, HER-2/neu not overexpressed.  . Complication of anesthesia    SEVERE MUSCLE ACHES FROM DIAPHRAGM TO THIGHS X 2 DAYS AFTER PARTIAL MASTECTOMY ON 07-25-16-PT WAS GIVEN SUCCINYLCHOLINE FOR INDUCTION  . Dyspnea on exertion    a. 09/2013 Echo: EF 60-65%, no rwma;  b. 11/2013 Myoview: EF 64%, no ischemia; c. 09/2016 Echo: EF 55-60%, no rwma.  . Family history of ovarian cancer    Pt is My Risk cancer genetic testing neg 2016  . Genetic testing 2016   My Risk negative  . Gross hematuria   . Headache(784.0)   . Hyperlipidemia   . Hypertension   . Hypothyroidism   . Migraines   . Morbid obesity ([place])   . Osteopenia   . Osteopenia   . Pernicious anemia   . Sleep apnea 2010   uses CPAP  . Syncope    a. 09/2016 - seen in ED - limited w/u unrevealing; b. 09/2016 Echo: eF 55-60%, no rwma;  b. 10/2016 Event monitor:  no signficant arrhythmias. Occas pac's.    Past Surgical History:  Procedure Laterality Date  . ABDOMINAL HYSTERECTOMY  2011   [place]; Dr. Laurey Morale, due to leio/adenomyosis  . CESAREAN SECTION     x 2  . CHOLECYSTECTOMY    . COLONOSCOPY    . DILATION AND CURETTAGE OF UTERUS    . FUNCTIONAL ENDOSCOPIC SINUS SURGERY      Family History  Problem Relation Age of Onset  . Pulmonary embolism Mother   . Hypertension Mother   . Hyperlipidemia Mother   . Hypertension Father   . Ovarian cancer Maternal Grandmother        37  . Colon cancer Paternal  Grandmother        30  . Breast cancer Neg Hx     Social History Social History   Tobacco Use  . Smoking status: Never Smoker  . Smokeless tobacco: Never Used  Substance Use Topics  . Alcohol use: No  . Drug use: No    Allergies  Allergen Reactions  . Morphine Other (See Comments)    Depressed respirations  . Niacin Anaphylaxis  . Septra [Sulfamethoxazole-Trimethoprim] Other (See Comments)    Fever and joint pain  . Doxycycline Hives  . Iodine Hives and Itching  . Ivp Dye [Iodinated Diagnostic Agents] Hives and Itching  . Levaquin [Levofloxacin In D5w] Other (See Comments)    Tendonitis   . Cephalexin Rash  . Penicillins Hives and Rash    Has patient had a PCN reaction causing immediate rash, facial/tongue/throat swelling, SOB or lightheadedness with hypotension: No Has patient had a PCN reaction causing severe rash involving mucus membranes or skin necrosis: No Has patient had a PCN reaction that required hospitalization: No Has patient had a PCN reaction occurring within the last 10 years: Yes If all of the above answers are "NO", then may proceed with Cephalosporin use.  Current Outpatient Medications  Medication Sig Dispense Refill  . ASCORBIC ACID PO Take 2 tablets by mouth every evening.    . Carboxymethylcellulose Sodium (THERATEARS OP) Place 1 drop into both eyes 2 (two) times daily as needed (dry eyes).    . cholecalciferol (VITAMIN D) 1000 units tablet Take 1,000 Units by mouth every evening.    . Cyanocobalamin (VITAMIN B-12 IJ) Inject 1,000 mcg as directed every 30 (thirty) days.    . diclofenac (VOLTAREN) 50 MG EC tablet Take 50 mg by mouth 2 (two) times daily.     . fluticasone (FLONASE) 50 MCG/ACT nasal spray Place 2 sprays into both nostrils daily.    Marland Kitchen letrozole (FEMARA) 2.5 MG tablet Take 1 tablet (2.5 mg total) daily by mouth. 90 tablet 4  . thyroid (ARMOUR) 90 MG tablet Take 90 mg by mouth daily.     No current facility-administered medications  for this visit.     Review of Systems Review of Systems  Constitutional: Negative.   Respiratory: Negative.   Cardiovascular: Negative.     Blood pressure 134/72, pulse 74, resp. rate 12, height 5' 1"  (1.549 m), weight 223 lb (101.2 kg).  Physical Exam Physical Exam  Constitutional: She is oriented to person, place, and time. She appears well-developed and well-nourished.  Eyes: Conjunctivae are normal. No scleral icterus.  Neck: Neck supple.  Cardiovascular: Normal rate, regular rhythm and normal heart sounds.  Pulmonary/Chest: Effort normal and breath sounds normal. Right breast exhibits no inverted nipple, no mass, no nipple discharge, no skin change and no tenderness. Left breast exhibits no inverted nipple, no mass, no nipple discharge, no skin change and no tenderness.    Right breast healed blister    Lymphadenopathy:    She has no cervical adenopathy.    She has no axillary adenopathy.  Neurological: She is alert and oriented to person, place, and time.  Skin: Skin is warm and dry.    [facility] record review  Bone density testing from 2017 completed through [facility] clinic showed osteopenia.  This will need to be repeated in 2019.  Assessment    Doing well status post wide excision complicated by seroma and infection, whole breast radiation.    Plan    The patient has an ER positive tumor and is a candidate for antiestrogen therapy.  She was offered the opportunity for formal medical oncology assessment which she declined.  Role of weightbearing exercise to improve bone health reviewed.  Patient presently making use of calcium supplements.  Potential for worsening hot flashes or irritability discussed.  Patient asked to send a my chart message in 1 month with her tolerance of the medication.    Patient for bone density test in 2019 and [facility]. Patient to call in one month and report how she is doing on the Femara.   Patient to return  in six months for bilateral diagnotic mammogram. The patient is aware to call back for any questions or concerns.   HPI, Physical Exam, Assessment and Plan have been scribed under the direction and in the presence of Hervey Ard, MD.  Gaspar Cola, CMA  I have completed the exam and reviewed the above documentation for accuracy and completeness.  I agree with the above.  Haematologist has been used and any errors in dictation or transcription are unintentional.  Hervey Ard, M.D., F.A.C.S.  Robert Bellow 12/21/2016, 10:29 AM

## 2016-12-30 DIAGNOSIS — G4733 Obstructive sleep apnea (adult) (pediatric): Secondary | ICD-10-CM | POA: Diagnosis not present

## 2017-01-19 ENCOUNTER — Other Ambulatory Visit: Payer: Self-pay

## 2017-01-19 ENCOUNTER — Ambulatory Visit
Admission: RE | Admit: 2017-01-19 | Discharge: 2017-01-19 | Disposition: A | Discharge status: Home / Self Care | Payer: 59 | Source: Ambulatory Visit | Attending: Radiation Oncology | Admitting: Radiation Oncology

## 2017-01-19 ENCOUNTER — Encounter: Payer: Self-pay | Admitting: Radiation Oncology

## 2017-01-19 VITALS — Temp 98.5°F | Resp 20 | Wt 218.9 lb

## 2017-01-19 DIAGNOSIS — Z923 Personal history of irradiation: Secondary | ICD-10-CM | POA: Diagnosis not present

## 2017-01-19 DIAGNOSIS — C50411 Malignant neoplasm of upper-outer quadrant of right female breast: Secondary | ICD-10-CM

## 2017-01-19 DIAGNOSIS — M791 Myalgia, unspecified site: Secondary | ICD-10-CM | POA: Diagnosis not present

## 2017-01-19 DIAGNOSIS — M1990 Unspecified osteoarthritis, unspecified site: Secondary | ICD-10-CM | POA: Diagnosis not present

## 2017-01-19 DIAGNOSIS — I10 Essential (primary) hypertension: Secondary | ICD-10-CM | POA: Diagnosis not present

## 2017-01-19 DIAGNOSIS — Z79811 Long term (current) use of aromatase inhibitors: Secondary | ICD-10-CM | POA: Diagnosis not present

## 2017-01-19 DIAGNOSIS — Z170 Estrogen receptor positive status [ER+]: Secondary | ICD-10-CM | POA: Insufficient documentation

## 2017-01-19 NOTE — Progress Notes (Signed)
Radiation Oncology Follow up Note  Name: Tammy Turner   Date:   01/19/2017 MRN:  [id number] DOB: 1960-11-24    This 56 y.o. female presents to the clinic today for One-month follow-up status post whole breast radiation to her right breast for stage I invasive mammary carcinoma ER/PR positive. Marland Kitchen  REFERRING PROVIDER: Juluis Pitch, MD  HPI: Patient is a 56 year old female now seen out 1 month having completed whole breast radiation to her right breast for ER/PR positive invasive mammary carcinoma stage I. Seen today in routine follow-up she is doing well. She specifically denies breast tenderness cough or bone pain. She still has some dry desquamation the skin mostly in the inframammary fold.  COMPLICATIONS OF TREATMENT: none  FOLLOW UP COMPLIANCE: keeps appointments   PHYSICAL EXAM:  Temp 98.5 F (36.9 C)   Resp 20   Wt 218 lb 14.7 oz (99.3 kg)   BMI 41.36 kg/m  Patient has large pendulous breasts. Still some dried escalation the skin is noted mostly in the inframammary fold of the right breast. No other dominant mass or nodularity is noted in either breast in 2 positions examined. No axillary or supraclavicular adenopathy is identified. Well-developed well-nourished patient in NAD. HEENT reveals PERLA, EOMI, discs not visualized.  Oral cavity is clear. No oral mucosal lesions are identified. Neck is clear without evidence of cervical or supraclavicular adenopathy. Lungs are clear to A&P. Cardiac examination is essentially unremarkable with regular rate and rhythm without murmur rub or thrill. Abdomen is benign with no organomegaly or masses noted. Motor sensory and DTR levels are equal and symmetric in the upper and lower extremities. Cranial nerves II through XII are grossly intact. Proprioception is intact. No peripheral adenopathy or edema is identified. No motor or sensory levels are noted. Crude visual fields are within normal range.  RADIOLOGY RESULTS: No current films for  review  PLAN: Present time patient is doing well recovering nicely from her radiation therapy treatments. I've assured her the dried escalation skin will stop another month or so. She is currently on Femara tolerating that well without side effect. I've asked to see her back in 3-4 months for follow-up. Patient knows to call sooner with any concerns.  I would like to take this opportunity to thank you for allowing me to participate in the care of your patient.Noreene Filbert, MD

## 2017-01-25 DIAGNOSIS — M7712 Lateral epicondylitis, left elbow: Secondary | ICD-10-CM | POA: Diagnosis not present

## 2017-01-25 DIAGNOSIS — M1990 Unspecified osteoarthritis, unspecified site: Secondary | ICD-10-CM | POA: Diagnosis not present

## 2017-01-25 DIAGNOSIS — M778 Other enthesopathies, not elsewhere classified: Secondary | ICD-10-CM | POA: Diagnosis not present

## 2017-01-26 DIAGNOSIS — E538 Deficiency of other specified B group vitamins: Secondary | ICD-10-CM | POA: Diagnosis not present

## 2017-01-29 ENCOUNTER — Encounter: Payer: Self-pay | Admitting: General Surgery

## 2017-02-03 ENCOUNTER — Telehealth: Payer: Self-pay | Admitting: General Surgery

## 2017-02-03 NOTE — Telephone Encounter (Signed)
The patient had been able to come off her blood pressure medicines after initiation of a ketogenic diet in spring of this year.  The last couple of weeks she is noted her blood pressure trending up.  At the time of her last visit in the office on December 21, 2016 it was 134/72.  At the time of a visit with rheumatology on January 25, 2017 it was 144/84.  She reports some of her home readings have been as high as 741 systolic and on one occasion 423 diastolic.  In the last 24-48 hours it has been running about 953 systolic and in the 20E diastolic.  She reports no change in her salt intake or weight gain.  She had questions about whether this could be related to the letrozole.  She has been on this medication for about 4 weeks.  She will keep a record of her blood pressures over the holiday weekend and call or email on February 08, 2017 with her BP status.  The patient reports that she is recently had a change on her thyroid medications and on her most recent visit with rheumatology there was a slight bump in her inflammatory markers from 10.5-12.0.  Improved from values in August 2018 when the CRP was 24.

## 2017-02-06 ENCOUNTER — Ambulatory Visit (INDEPENDENT_AMBULATORY_CARE_PROVIDER_SITE_OTHER): Payer: 59 | Admitting: Obstetrics and Gynecology

## 2017-02-06 ENCOUNTER — Encounter: Payer: Self-pay | Admitting: Obstetrics and Gynecology

## 2017-02-06 VITALS — BP 160/98 | HR 78 | Ht 61.0 in | Wt 215.0 lb

## 2017-02-06 DIAGNOSIS — Z1151 Encounter for screening for human papillomavirus (HPV): Secondary | ICD-10-CM | POA: Diagnosis not present

## 2017-02-06 DIAGNOSIS — Z124 Encounter for screening for malignant neoplasm of cervix: Secondary | ICD-10-CM

## 2017-02-06 DIAGNOSIS — Z1231 Encounter for screening mammogram for malignant neoplasm of breast: Secondary | ICD-10-CM

## 2017-02-06 DIAGNOSIS — Z1239 Encounter for other screening for malignant neoplasm of breast: Secondary | ICD-10-CM

## 2017-02-06 DIAGNOSIS — Z01419 Encounter for gynecological examination (general) (routine) without abnormal findings: Secondary | ICD-10-CM

## 2017-02-06 NOTE — Progress Notes (Signed)
PCP: Juluis Pitch, MD   Chief Complaint  Patient presents with  . Gynecologic Exam    HPI:      [name] is a 56 y.o. [id number] who LMP was No LMP recorded. Patient has had a hysterectomy., presents today for her annual examination.  Her menses are absent and she is postmenopausal.  She does not have intermenstrual bleeding.  She does have vasomotor sx but has just started femara for breast cancer.  Sex activity: single partner, contraception - post menopausal status. She does not have vaginal dryness.  Last Pap: February 04, 2014  Results were: no abnormalities /neg HPV DNA.  Hx of STDs: none  Last mammogram: 06/23/16 that was Cat 5. She had a bx with Dr. Bary Castilla and was found to have stage 1 breast cancer, ER+. She did excision with radiation, and started femara 4-5 wks ago. She is having side efffects including HTN, vasomotor sx, hair loss, headaches.  There is no FH of breast cancer. There is a FH of ovarian cancer in her MGM. Pt tested MyRisk neg 2016. The patient does do self-breast exams.  Colonoscopy: colonoscopy 2 years ago with abnormalities.  Repeat due after 5 years.   Tobacco use: The patient denies current or previous tobacco use. Alcohol use: none Exercise: moderately active  She does get adequate calcium and Vitamin D in her diet.  Labs with PCP.  Past Medical History:  Diagnosis Date  . Allergic state   . Anemia   . Breast cancer of upper-outer quadrant of right female breast ([place]) 06/2016   ER 95%, PR 90%, HER-2/neu not overexpressed.  . Complication of anesthesia    SEVERE MUSCLE ACHES FROM DIAPHRAGM TO THIGHS X 2 DAYS AFTER PARTIAL MASTECTOMY ON 07-25-16-PT WAS GIVEN SUCCINYLCHOLINE FOR INDUCTION  . Dyspnea on exertion    a. 09/2013 Echo: EF 60-65%, no rwma;  b. 11/2013 Myoview: EF 64%, no ischemia; c. 09/2016 Echo: EF 55-60%, no rwma.  . Family history of ovarian cancer    Pt is My Risk cancer genetic testing neg 2016  . Genetic testing 2016   My Risk negative  . Gross hematuria   . Headache(784.0)   . Hyperlipidemia   . Hypertension   . Hypothyroidism   . Migraines   . Morbid obesity ([place])   . Osteopenia   . Osteopenia   . Pernicious anemia   . Sleep apnea 2010   uses CPAP  . Syncope    a. 09/2016 - seen in ED - limited w/u unrevealing; b. 09/2016 Echo: eF 55-60%, no rwma;  b. 10/2016 Event monitor:  no signficant arrhythmias. Occas pac's.    Past Surgical History:  Procedure Laterality Date  . ABDOMINAL HYSTERECTOMY  2011   [place]; Dr. Laurey Morale, due to leio/adenomyosis  . BREAST LUMPECTOMY Right 08/08/2016   Procedure: BREAST RE-EXCISION;  Surgeon: Robert Bellow, MD;  Location: [facility] ORS;  Service: General;  Laterality: Right;  . CESAREAN SECTION     x 2  . CHOLECYSTECTOMY    . COLONOSCOPY    . COLONOSCOPY WITH PROPOFOL N/A 10/05/2015   Procedure: COLONOSCOPY WITH PROPOFOL;  Surgeon: Manya Silvas, MD;  Location: [facility] ENDOSCOPY;  Service: Endoscopy;  Laterality: N/A;  . DILATION AND CURETTAGE OF UTERUS    . FUNCTIONAL ENDOSCOPIC SINUS SURGERY    . PARTIAL MASTECTOMY WITH AXILLARY SENTINEL LYMPH NODE BIOPSY Right 07/25/2016   Procedure: PARTIAL MASTECTOMY WITH AXILLARY SENTINEL LYMPH NODE BIOPSY;  Surgeon: Robert Bellow, MD;  Location: [facility] ORS;  Service: General;  Laterality: Right;    Family History  Problem Relation Age of Onset  . Pulmonary embolism Mother   . Hypertension Mother   . Hyperlipidemia Mother   . Hypertension Father   . Colon cancer Father 44  . Ovarian cancer Maternal Grandmother        70  . Colon cancer Paternal Grandmother        32  . Breast cancer Neg Hx     Social History   Socioeconomic History  . Marital status: Married    Spouse name: Not on file  . Number of children: Not on file  . Years of education: Not on file  . Highest education level: Not on file  Social Needs  . Financial resource strain: Not on file  . Food insecurity - worry: Not on file  . Food insecurity  - inability: Not on file  . Transportation needs - medical: Not on file  . Transportation needs - non-medical: Not on file  Occupational History  . Not on file  Tobacco Use  . Smoking status: Never Smoker  . Smokeless tobacco: Never Used  Substance and Sexual Activity  . Alcohol use: No  . Drug use: No  . Sexual activity: Yes    Birth control/protection: Surgical  Other Topics Concern  . Not on file  Social History Narrative  . Not on file    Current Meds  Medication Sig  . ARMOUR THYROID 15 MG tablet Take 1 tablet by mouth daily.  . ASCORBIC ACID PO Take 2 tablets by mouth every evening.  Marland Kitchen CALCIUM PO Take by mouth.  . Carboxymethylcellulose Sodium (THERATEARS OP) Place 1 drop into both eyes 2 (two) times daily as needed (dry eyes).  . cholecalciferol (VITAMIN D) 1000 units tablet Take 1,000 Units by mouth every evening.  . Cyanocobalamin (VITAMIN B-12 IJ) Inject 1,000 mcg as directed every 30 (thirty) days.  . diclofenac (VOLTAREN) 50 MG EC tablet Take 50 mg by mouth 2 (two) times daily.   Marland Kitchen EPINEPHrine 0.3 mg/0.3 mL IJ SOAJ injection INJECT INTRAMUSCULARLY AS DIRECTED  . fluticasone (FLONASE) 50 MCG/ACT nasal spray Place 2 sprays into both nostrils daily.  Marland Kitchen letrozole (FEMARA) 2.5 MG tablet Take 1 tablet (2.5 mg total) daily by mouth.  Marland Kitchen MAGNESIUM PO Take by mouth.  . thyroid (ARMOUR) 90 MG tablet Take 90 mg by mouth daily.  Marland Kitchen ZINC SULFATE PO Take by mouth.      ROS:  Review of Systems  Constitutional: Negative for fatigue, fever and unexpected weight change.  Respiratory: Negative for cough, shortness of breath and wheezing.   Cardiovascular: Negative for chest pain, palpitations and leg swelling.  Gastrointestinal: Negative for blood in stool, constipation, diarrhea, nausea and vomiting.  Endocrine: Negative for cold intolerance, heat intolerance and polyuria.  Genitourinary: Negative for dyspareunia, dysuria, flank pain, frequency, genital sores, hematuria,  menstrual problem, pelvic pain, urgency, vaginal bleeding, vaginal discharge and vaginal pain.  Musculoskeletal: Negative for back pain, joint swelling and myalgias.  Skin: Negative for rash.  Neurological: Positive for headaches. Negative for dizziness, syncope, light-headedness and numbness.  Hematological: Negative for adenopathy.  Psychiatric/Behavioral: Negative for agitation, confusion, sleep disturbance and suicidal ideas. The patient is not nervous/anxious.      Objective: BP (!) 160/98   Pulse 78   Ht 5' 1"  (1.549 m)   Wt 215 lb (97.5 kg)   BMI 40.62 kg/m    Physical Exam  Constitutional: She is  oriented to person, place, and time. She appears well-developed and well-nourished.  Genitourinary: Vagina normal and uterus normal. There is no rash or tenderness on the right labia. There is no rash or tenderness on the left labia. No erythema or tenderness in the vagina. No vaginal discharge found. Right adnexum does not display mass and does not display tenderness. Left adnexum does not display mass and does not display tenderness. Cervix does not exhibit motion tenderness or polyp. Uterus is not enlarged or tender.  Neck: Normal range of motion. No thyromegaly present.  Cardiovascular: Normal rate, regular rhythm and normal heart sounds.  No murmur heard. Pulmonary/Chest: Effort normal and breath sounds normal. Right breast exhibits no mass, no nipple discharge, no skin change and no tenderness. Left breast exhibits no mass, no nipple discharge, no skin change and no tenderness.  Abdominal: Soft. There is no tenderness. There is no guarding.  Musculoskeletal: Normal range of motion.  Neurological: She is alert and oriented to person, place, and time. No cranial nerve deficit.  Psychiatric: She has a normal mood and affect. Her behavior is normal.  Vitals reviewed.   Assessment/Plan:  Encounter for annual routine gynecological examination  Cervical cancer screening - Plan:  IGP, Aptima HPV  Screening for HPV (human papillomavirus) - Plan: IGP, Aptima HPV  Screening for breast cancer - Pt being followed by Dr. Bary Castilla        GYN counsel breast self exam, mammography screening, menopause, adequate intake of calcium and vitamin D, diet and exercise    F/U  Return in about 1 year (around 02/06/2018).  Angie Hogg B. Avnoor Koury, PA-C 02/06/2017 2:25 PM

## 2017-02-06 NOTE — Patient Instructions (Signed)
I value your feedback and entrusting us with your care. If you get a [facility] patient survey, I would appreciate you taking the time to let us know about your experience today. Thank you! 

## 2017-02-08 ENCOUNTER — Telehealth: Payer: Self-pay | Admitting: *Deleted

## 2017-02-08 NOTE — Telephone Encounter (Signed)
Patient called to give you a update on her blood pressure, she stated that it was still high. She went to her OBGYN on 02/06/17 and it was 160/98.

## 2017-02-08 NOTE — Telephone Encounter (Signed)
The patient provided an update in regards to her elevated blood pressure which she has noticed since beginning Femara.  A prior today had a routine GYN visit scheduled to 160/98.  She had been taking antihypertensives until early 2018 when diet and exercise made it unnecessary by her report.  Review of the available literature does not show a link between Femara and hypertension, but the timeline the patient reports is certainly suggestive.  She is been asked to discontinue her Femara at this time.  She will touch base with her PCP in the next week for repeat blood pressure check and follow his recommendations in that regard.  Once the blood pressure is come back down to normal with or without medication, we will discuss reinstituting from our versus 1 of the other aromatase inhibitors.  She will keep me posted in regards to her blood pressure changes.

## 2017-02-09 LAB — IGP, APTIMA HPV
HPV APTIMA: NEGATIVE
PAP SMEAR COMMENT: 0

## 2017-02-24 ENCOUNTER — Encounter: Payer: Self-pay | Admitting: General Surgery

## 2017-02-24 DIAGNOSIS — I10 Essential (primary) hypertension: Secondary | ICD-10-CM | POA: Diagnosis not present

## 2017-02-27 DIAGNOSIS — M7061 Trochanteric bursitis, right hip: Secondary | ICD-10-CM | POA: Diagnosis not present

## 2017-03-06 DIAGNOSIS — I10 Essential (primary) hypertension: Secondary | ICD-10-CM | POA: Diagnosis not present

## 2017-03-06 DIAGNOSIS — N390 Urinary tract infection, site not specified: Secondary | ICD-10-CM | POA: Diagnosis not present

## 2017-03-20 DIAGNOSIS — E039 Hypothyroidism, unspecified: Secondary | ICD-10-CM | POA: Diagnosis not present

## 2017-03-20 DIAGNOSIS — I10 Essential (primary) hypertension: Secondary | ICD-10-CM | POA: Diagnosis not present

## 2017-03-21 ENCOUNTER — Encounter: Payer: Self-pay | Admitting: General Surgery

## 2017-04-10 ENCOUNTER — Encounter: Payer: Self-pay | Admitting: General Surgery

## 2017-04-15 ENCOUNTER — Telehealth: Payer: Self-pay | Admitting: General Surgery

## 2017-04-15 MED ORDER — EXEMESTANE 25 MG PO TABS: 25.0000 mg | ORAL_TABLET | Freq: Every day | ORAL | 11 refills | Status: DC

## 2017-04-15 NOTE — Telephone Encounter (Signed)
Patient has once again had ocular migraines associated with nausea and headaches after reinitiation of Femara therapy.  Statistically, this is the least likely of the aromatase inhibitors to give trouble.  Options at this time include 1) a change to a second aromatase inhibitor hoping it is better tolerated versus 2) changing tamoxifen with its different risk profile including that of DVT/PE.  At this time will make use of a trial of Aromasin 25 mg daily.  The patient will give a progress report of her tolerance of this medication.  Should she develop recurrent headaches or ocular migraine symptoms she should stop promptly and we will regroup.

## 2017-04-27 ENCOUNTER — Telehealth: Payer: Self-pay | Admitting: General Surgery

## 2017-04-27 ENCOUNTER — Encounter: Payer: Self-pay | Admitting: General Surgery

## 2017-04-27 ENCOUNTER — Other Ambulatory Visit: Payer: Self-pay

## 2017-04-27 DIAGNOSIS — C50411 Malignant neoplasm of upper-outer quadrant of right female breast: Secondary | ICD-10-CM

## 2017-04-27 DIAGNOSIS — Z170 Estrogen receptor positive status [ER+]: Principal | ICD-10-CM

## 2017-04-27 NOTE — Telephone Encounter (Signed)
I spoke with patient yesterday(04-26-17) & she was checking on when her mammo & appt with Dr Bary Castilla where scheduled for.She needs both her mammo & ofc appt before 06-28-17 because she is going [place]. I left a note for Caryl-lyn.Today 04-27-17 Cary-lyn has put in the order for patient's mammo.I have called patient & l/m for her to call mammo @ [phone] & once scheduled to call us back so we could schedule her ofc appt.

## 2017-05-01 ENCOUNTER — Telehealth: Payer: Self-pay | Admitting: *Deleted

## 2017-05-01 NOTE — Telephone Encounter (Signed)
Left message for patient to call the office back. She just needs to know that her office visit is scheduled for 06/20/17 at 9:00am to see Dr.Byrnett for follow up from mammogram.

## 2017-05-12 ENCOUNTER — Encounter: Payer: Self-pay | Admitting: General Surgery

## 2017-05-12 ENCOUNTER — Other Ambulatory Visit: Payer: Self-pay | Admitting: General Surgery

## 2017-05-17 ENCOUNTER — Other Ambulatory Visit: Payer: Self-pay | Admitting: General Surgery

## 2017-05-17 MED ORDER — EXEMESTANE 25 MG PO TABS: 25.0000 mg | ORAL_TABLET | Freq: Every day | ORAL | 4 refills | Status: DC

## 2017-05-17 NOTE — Progress Notes (Signed)
aromasin

## 2017-06-05 ENCOUNTER — Encounter: Payer: Self-pay | Admitting: Radiation Oncology

## 2017-06-05 ENCOUNTER — Other Ambulatory Visit: Payer: Self-pay

## 2017-06-05 ENCOUNTER — Ambulatory Visit
Admission: RE | Admit: 2017-06-05 | Discharge: 2017-06-05 | Disposition: A | Discharge status: Home / Self Care | Payer: 59 | Source: Ambulatory Visit | Attending: Radiation Oncology | Admitting: Radiation Oncology

## 2017-06-05 VITALS — BP 124/60 | Temp 98.4°F | Wt 219.7 lb

## 2017-06-05 DIAGNOSIS — Z79811 Long term (current) use of aromatase inhibitors: Secondary | ICD-10-CM | POA: Insufficient documentation

## 2017-06-05 DIAGNOSIS — C50411 Malignant neoplasm of upper-outer quadrant of right female breast: Secondary | ICD-10-CM | POA: Diagnosis not present

## 2017-06-05 DIAGNOSIS — Z923 Personal history of irradiation: Secondary | ICD-10-CM | POA: Diagnosis not present

## 2017-06-05 DIAGNOSIS — Z170 Estrogen receptor positive status [ER+]: Secondary | ICD-10-CM | POA: Diagnosis not present

## 2017-06-05 NOTE — Progress Notes (Signed)
Radiation Oncology Follow up Note  Name: Tammy Turner   Date:   06/05/2017 MRN:  [id number] DOB: [birth year]    This 57 y.o. female presents to the clinic today for six-month follow-up status post whole breast radiation to her right breast for stage I invasive mammary carcinoma ER/PR positive.  REFERRING PROVIDER: Juluis Pitch, MD  HPI: patient is a 57 year old female now seen out 6 months having completed whole breast radiation to her right breast for stage I invasive mammary carcinoma. Seen today in routine follow up she is doing well. She specifically denies breast tenderness cough or bone pain. Still some slight tenderness in her right axilla. She is currently on.Femara tolerate that well without side effect.she is scheduled for mammogram in May.  COMPLICATIONS OF TREATMENT: none  FOLLOW UP COMPLIANCE: keeps appointments   PHYSICAL EXAM:  BP 124/60   Temp 98.4 F (36.9 C)   Wt 219 lb 11 oz (99.6 kg)   BMI 41.51 kg/m  Lungs are clear to A&P cardiac examination essentially unremarkable with regular rate and rhythm. No dominant mass or nodularity is noted in either breast in 2 positions examined. Incision is well-healed. No axillary or supraclavicular adenopathy is appreciated. Cosmetic result is excellent. Well-developed well-nourished patient in NAD. HEENT reveals PERLA, EOMI, discs not visualized.  Oral cavity is clear. No oral mucosal lesions are identified. Neck is clear without evidence of cervical or supraclavicular adenopathy. Lungs are clear to A&P. Cardiac examination is essentially unremarkable with regular rate and rhythm without murmur rub or thrill. Abdomen is benign with no organomegaly or masses noted. Motor sensory and DTR levels are equal and symmetric in the upper and lower extremities. Cranial nerves II through XII are grossly intact. Proprioception is intact. No peripheral adenopathy or edema is identified. No motor or sensory levels are noted. Crude visual fields  are within normal range.  RADIOLOGY RESULTS: no current films for review will review mammograms in May  PLAN: present time patient is doing well with no evidence of disease. I'm please were overall progress. I've asked to see her back in 6 months for follow-up and then will go to once your visits. She is a rescheduled for mammograms in May. She continues on Femara without side effect. Patient is to call with any concerns.  I would like to take this opportunity to thank you for allowing me to participate in the care of your patient.Noreene Filbert, MD

## 2017-06-13 DIAGNOSIS — G4733 Obstructive sleep apnea (adult) (pediatric): Secondary | ICD-10-CM | POA: Diagnosis not present

## 2017-06-15 ENCOUNTER — Ambulatory Visit
Admission: RE | Admit: 2017-06-15 | Discharge: 2017-06-15 | Disposition: A | Discharge status: Home / Self Care | Payer: 59 | Source: Ambulatory Visit | Attending: General Surgery | Admitting: General Surgery

## 2017-06-15 DIAGNOSIS — R928 Other abnormal and inconclusive findings on diagnostic imaging of breast: Secondary | ICD-10-CM | POA: Diagnosis not present

## 2017-06-15 DIAGNOSIS — C50411 Malignant neoplasm of upper-outer quadrant of right female breast: Secondary | ICD-10-CM

## 2017-06-15 DIAGNOSIS — Z170 Estrogen receptor positive status [ER+]: Principal | ICD-10-CM

## 2017-06-15 DIAGNOSIS — Z853 Personal history of malignant neoplasm of breast: Secondary | ICD-10-CM | POA: Diagnosis not present

## 2017-06-15 DIAGNOSIS — E538 Deficiency of other specified B group vitamins: Secondary | ICD-10-CM | POA: Diagnosis not present

## 2017-06-20 ENCOUNTER — Encounter: Payer: Self-pay | Admitting: General Surgery

## 2017-06-20 ENCOUNTER — Ambulatory Visit (INDEPENDENT_AMBULATORY_CARE_PROVIDER_SITE_OTHER): Payer: 59 | Admitting: General Surgery

## 2017-06-20 VITALS — BP 122/74 | HR 84 | Resp 12 | Ht 60.7 in | Wt 220.0 lb

## 2017-06-20 DIAGNOSIS — C50411 Malignant neoplasm of upper-outer quadrant of right female breast: Secondary | ICD-10-CM | POA: Diagnosis not present

## 2017-06-20 DIAGNOSIS — Z170 Estrogen receptor positive status [ER+]: Secondary | ICD-10-CM

## 2017-06-20 NOTE — Patient Instructions (Signed)
The patient has been asked to return to the office in one year with a bilateral diagnostic mammogram. 

## 2017-06-20 NOTE — Progress Notes (Signed)
Patient ID: Tammy Turner, female   DOB: [birth year], 57 y.o.   MRN: [id number]  Chief Complaint  Patient presents with  . Follow-up    HPI Tammy Turner is a 57 y.o. female who presents for a breast evaluation. The most recent mammogram was done on 06/15/2017.  Patient does perform regular self breast checks and gets regular mammograms done.   She is going to [place] next week. She is tolerating the Aromasin much better than the Femara, but she still has hot flashes.   HPI  Past Medical History:  Diagnosis Date  . Allergic state   . Anemia   . Breast cancer of upper-outer quadrant of right female breast ([place]) 06/2016   ER 95%, PR 90%, HER-2/neu not overexpressed.  . Complication of anesthesia    SEVERE MUSCLE ACHES FROM DIAPHRAGM TO THIGHS X 2 DAYS AFTER PARTIAL MASTECTOMY ON 07-25-16-PT WAS GIVEN SUCCINYLCHOLINE FOR INDUCTION  . Dyspnea on exertion    a. 09/2013 Echo: EF 60-65%, no rwma;  b. 11/2013 Myoview: EF 64%, no ischemia; c. 09/2016 Echo: EF 55-60%, no rwma.  . Family history of ovarian cancer    Pt is My Risk cancer genetic testing neg 2016  . Genetic testing 2016   My Risk negative  . Gross hematuria   . Headache(784.0)   . Hyperlipidemia   . Hypertension   . Hypothyroidism   . Migraines   . Morbid obesity ([place])   . Osteopenia   . Osteopenia   . Pernicious anemia   . Sleep apnea 2010   uses CPAP  . Syncope    a. 09/2016 - seen in ED - limited w/u unrevealing; b. 09/2016 Echo: eF 55-60%, no rwma;  b. 10/2016 Event monitor:  no signficant arrhythmias. Occas pac's.    Past Surgical History:  Procedure Laterality Date  . ABDOMINAL HYSTERECTOMY  2011   [place]; Dr. Laurey Morale, due to leio/adenomyosis  . BREAST EXCISIONAL BIOPSY Right 07/25/2016   lumpectomy radation reexcison + anterior margins 08-08-16  . BREAST LUMPECTOMY Right 08/08/2016   Procedure: BREAST RE-EXCISION;  Surgeon: Robert Bellow, MD;  Location: [facility] ORS;  Service: General;  Laterality: Right;  . CESAREAN  SECTION     x 2  . CHOLECYSTECTOMY    . COLONOSCOPY    . COLONOSCOPY WITH PROPOFOL N/A 10/05/2015   Procedure: COLONOSCOPY WITH PROPOFOL;  Surgeon: Manya Silvas, MD;  Location: [facility] ENDOSCOPY;  Service: Endoscopy;  Laterality: N/A;  . DILATION AND CURETTAGE OF UTERUS    . FUNCTIONAL ENDOSCOPIC SINUS SURGERY    . PARTIAL MASTECTOMY WITH AXILLARY SENTINEL LYMPH NODE BIOPSY Right 07/25/2016   Procedure: PARTIAL MASTECTOMY WITH AXILLARY SENTINEL LYMPH NODE BIOPSY;  Surgeon: Robert Bellow, MD;  Location: [facility] ORS;  Service: General;  Laterality: Right;    Family History  Problem Relation Age of Onset  . Pulmonary embolism Mother   . Hypertension Mother   . Hyperlipidemia Mother   . Hypertension Father   . Colon cancer Father 4  . Ovarian cancer Maternal Grandmother        71  . Colon cancer Paternal Grandmother        62  . Breast cancer Neg Hx     Social History Social History   Tobacco Use  . Smoking status: Never Smoker  . Smokeless tobacco: Never Used  Substance Use Topics  . Alcohol use: No  . Drug use: No    Allergies  Allergen Reactions  . Morphine Other (See Comments)  Depressed respirations  . Niacin Anaphylaxis  . Septra [Sulfamethoxazole-Trimethoprim] Other (See Comments)    Fever and joint pain  . Doxycycline Hives  . Iodine Hives and Itching  . Ivp Dye [Iodinated Diagnostic Agents] Hives and Itching  . Levaquin [Levofloxacin In D5w] Other (See Comments)    Tendonitis   . Cephalexin Rash  . Penicillins Hives and Rash    Has patient had a PCN reaction causing immediate rash, facial/tongue/throat swelling, SOB or lightheadedness with hypotension: No Has patient had a PCN reaction causing severe rash involving mucus membranes or skin necrosis: No Has patient had a PCN reaction that required hospitalization: No Has patient had a PCN reaction occurring within the last 10 years: Yes If all of the above answers are "NO", then may proceed with  Cephalosporin use.     Current Outpatient Medications  Medication Sig Dispense Refill  . ARMOUR THYROID 15 MG tablet Take 1 tablet by mouth daily.  0  . ASCORBIC ACID PO Take 2 tablets by mouth every evening.    Marland Kitchen CALCIUM PO Take by mouth.    . Carboxymethylcellulose Sodium (THERATEARS OP) Place 1 drop into both eyes 2 (two) times daily as needed (dry eyes).    . cholecalciferol (VITAMIN D) 1000 units tablet Take 1,000 Units by mouth every evening.    . Cyanocobalamin (VITAMIN B-12 IJ) Inject 1,000 mcg as directed every 30 (thirty) days.    . diclofenac (VOLTAREN) 50 MG EC tablet Take 50 mg by mouth 2 (two) times daily.     Marland Kitchen EPINEPHrine 0.3 mg/0.3 mL IJ SOAJ injection INJECT INTRAMUSCULARLY AS DIRECTED    . exemestane (AROMASIN) 25 MG tablet Take 1 tablet (25 mg total) by mouth daily after breakfast. 90 tablet 4  . fluticasone (FLONASE) 50 MCG/ACT nasal spray Place 2 sprays into both nostrils daily.    Marland Kitchen MAGNESIUM PO Take by mouth.    . thyroid (ARMOUR) 90 MG tablet Take 90 mg by mouth daily.    Marland Kitchen ZINC SULFATE PO Take by mouth.     No current facility-administered medications for this visit.     Review of Systems Review of Systems  Constitutional: Negative.   Respiratory: Negative.   Cardiovascular: Negative.     Blood pressure 122/74, pulse 84, resp. rate 12, height 5' 0.7" (1.542 m), weight 220 lb (99.8 kg), SpO2 98 %.  Physical Exam Physical Exam  Constitutional: She is oriented to person, place, and time. She appears well-developed and well-nourished.  HENT:  Mouth/Throat: Oropharynx is clear and moist.  Eyes: Conjunctivae are normal. No scleral icterus.  Neck: Neck supple.  Cardiovascular: Normal rate, regular rhythm and normal heart sounds.  Pulmonary/Chest: Effort normal and breath sounds normal. Right breast exhibits no inverted nipple, no mass, no nipple discharge, no skin change and no tenderness. Left breast exhibits no inverted nipple, no mass, no nipple  discharge, no skin change and no tenderness.    Lymphadenopathy:    She has no cervical adenopathy.    She has no axillary adenopathy.  Neurological: She is alert and oriented to person, place, and time.  Skin: Skin is warm and dry.  Psychiatric: Her behavior is normal.    Data Reviewed Bilateral diagnostic mammograms dated Jun 15, 2017 were reviewed.  BI-RADS-2.  Assessment    Doing well status post right breast conservation.    Plan The patient has been asked to return to the office in one year with a bilateral diagnostic mammogram.The patient is aware to call  back for any questions or concerns.   HPI, Physical Exam, Assessment and Plan have been scribed under the direction and in the presence of Robert Bellow, MD. Karie Fetch, RN  I have completed the exam and reviewed the above documentation for accuracy and completeness.  I agree with the above.  Haematologist has been used and any errors in dictation or transcription are unintentional.  Hervey Ard, M.D., F.A.C.S. The patient will be traveling to [place] later this month Robert Bellow 06/21/2017, 8:56 PM

## 2017-08-08 DIAGNOSIS — E538 Deficiency of other specified B group vitamins: Secondary | ICD-10-CM | POA: Diagnosis not present

## 2017-09-04 DIAGNOSIS — I10 Essential (primary) hypertension: Secondary | ICD-10-CM | POA: Diagnosis not present

## 2017-09-04 DIAGNOSIS — E039 Hypothyroidism, unspecified: Secondary | ICD-10-CM | POA: Diagnosis not present

## 2017-09-25 DIAGNOSIS — M546 Pain in thoracic spine: Secondary | ICD-10-CM | POA: Diagnosis not present

## 2017-10-04 DIAGNOSIS — M542 Cervicalgia: Secondary | ICD-10-CM | POA: Diagnosis not present

## 2017-10-04 DIAGNOSIS — M47812 Spondylosis without myelopathy or radiculopathy, cervical region: Secondary | ICD-10-CM | POA: Diagnosis not present

## 2017-10-04 DIAGNOSIS — M9901 Segmental and somatic dysfunction of cervical region: Secondary | ICD-10-CM | POA: Diagnosis not present

## 2017-10-10 DIAGNOSIS — M9901 Segmental and somatic dysfunction of cervical region: Secondary | ICD-10-CM | POA: Diagnosis not present

## 2017-10-10 DIAGNOSIS — M47812 Spondylosis without myelopathy or radiculopathy, cervical region: Secondary | ICD-10-CM | POA: Diagnosis not present

## 2017-10-10 DIAGNOSIS — M542 Cervicalgia: Secondary | ICD-10-CM | POA: Diagnosis not present

## 2017-10-12 DIAGNOSIS — J019 Acute sinusitis, unspecified: Secondary | ICD-10-CM | POA: Diagnosis not present

## 2017-10-16 DIAGNOSIS — M9901 Segmental and somatic dysfunction of cervical region: Secondary | ICD-10-CM | POA: Diagnosis not present

## 2017-10-16 DIAGNOSIS — M47812 Spondylosis without myelopathy or radiculopathy, cervical region: Secondary | ICD-10-CM | POA: Diagnosis not present

## 2017-10-16 DIAGNOSIS — M542 Cervicalgia: Secondary | ICD-10-CM | POA: Diagnosis not present

## 2017-10-17 DIAGNOSIS — M542 Cervicalgia: Secondary | ICD-10-CM | POA: Diagnosis not present

## 2017-10-17 DIAGNOSIS — M9901 Segmental and somatic dysfunction of cervical region: Secondary | ICD-10-CM | POA: Diagnosis not present

## 2017-10-17 DIAGNOSIS — M47812 Spondylosis without myelopathy or radiculopathy, cervical region: Secondary | ICD-10-CM | POA: Diagnosis not present

## 2017-10-23 DIAGNOSIS — M542 Cervicalgia: Secondary | ICD-10-CM | POA: Diagnosis not present

## 2017-10-23 DIAGNOSIS — M47812 Spondylosis without myelopathy or radiculopathy, cervical region: Secondary | ICD-10-CM | POA: Diagnosis not present

## 2017-10-23 DIAGNOSIS — M9901 Segmental and somatic dysfunction of cervical region: Secondary | ICD-10-CM | POA: Diagnosis not present

## 2017-10-24 DIAGNOSIS — M9901 Segmental and somatic dysfunction of cervical region: Secondary | ICD-10-CM | POA: Diagnosis not present

## 2017-10-24 DIAGNOSIS — M542 Cervicalgia: Secondary | ICD-10-CM | POA: Diagnosis not present

## 2017-10-24 DIAGNOSIS — M47812 Spondylosis without myelopathy or radiculopathy, cervical region: Secondary | ICD-10-CM | POA: Diagnosis not present

## 2017-10-27 ENCOUNTER — Encounter: Payer: Self-pay | Admitting: *Deleted

## 2017-10-30 DIAGNOSIS — M542 Cervicalgia: Secondary | ICD-10-CM | POA: Diagnosis not present

## 2017-10-30 DIAGNOSIS — M9901 Segmental and somatic dysfunction of cervical region: Secondary | ICD-10-CM | POA: Diagnosis not present

## 2017-10-30 DIAGNOSIS — M47812 Spondylosis without myelopathy or radiculopathy, cervical region: Secondary | ICD-10-CM | POA: Diagnosis not present

## 2017-10-31 DIAGNOSIS — M47812 Spondylosis without myelopathy or radiculopathy, cervical region: Secondary | ICD-10-CM | POA: Diagnosis not present

## 2017-10-31 DIAGNOSIS — M542 Cervicalgia: Secondary | ICD-10-CM | POA: Diagnosis not present

## 2017-10-31 DIAGNOSIS — M9901 Segmental and somatic dysfunction of cervical region: Secondary | ICD-10-CM | POA: Diagnosis not present

## 2017-11-01 DIAGNOSIS — Z23 Encounter for immunization: Secondary | ICD-10-CM | POA: Diagnosis not present

## 2017-11-07 DIAGNOSIS — M542 Cervicalgia: Secondary | ICD-10-CM | POA: Diagnosis not present

## 2017-11-07 DIAGNOSIS — M47812 Spondylosis without myelopathy or radiculopathy, cervical region: Secondary | ICD-10-CM | POA: Diagnosis not present

## 2017-11-07 DIAGNOSIS — M9901 Segmental and somatic dysfunction of cervical region: Secondary | ICD-10-CM | POA: Diagnosis not present

## 2017-11-08 DIAGNOSIS — M47812 Spondylosis without myelopathy or radiculopathy, cervical region: Secondary | ICD-10-CM | POA: Diagnosis not present

## 2017-11-08 DIAGNOSIS — M9901 Segmental and somatic dysfunction of cervical region: Secondary | ICD-10-CM | POA: Diagnosis not present

## 2017-11-08 DIAGNOSIS — M542 Cervicalgia: Secondary | ICD-10-CM | POA: Diagnosis not present

## 2017-11-08 IMAGING — CR DG CHEST 2V
2 series · 2 of 2 positions shown · non-contrast
Comparison: Chest x-ray 12/05/2006.

CLINICAL DATA: Persistent fever.

EXAM:
CHEST  2 VIEW

[chest pa]
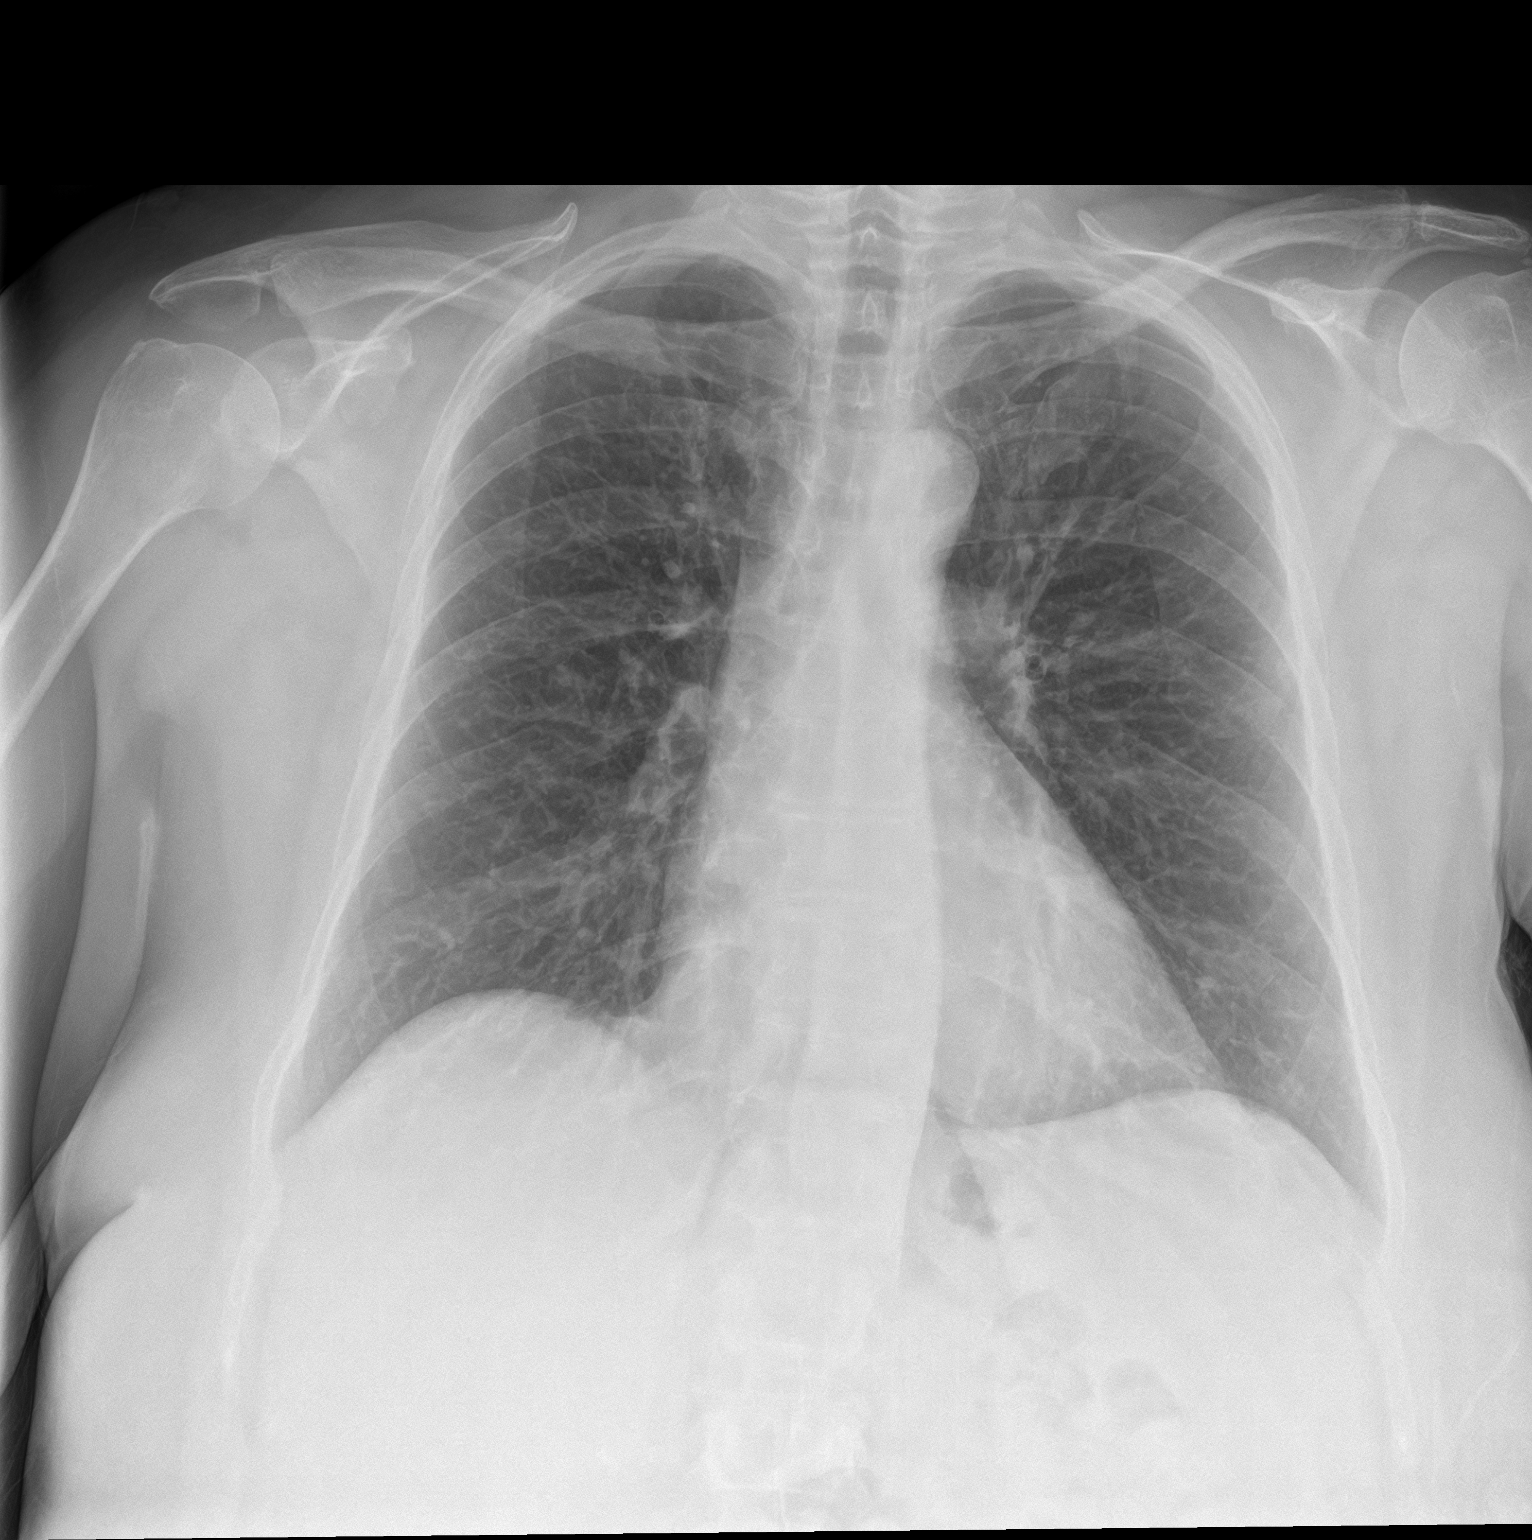

[chest lat]
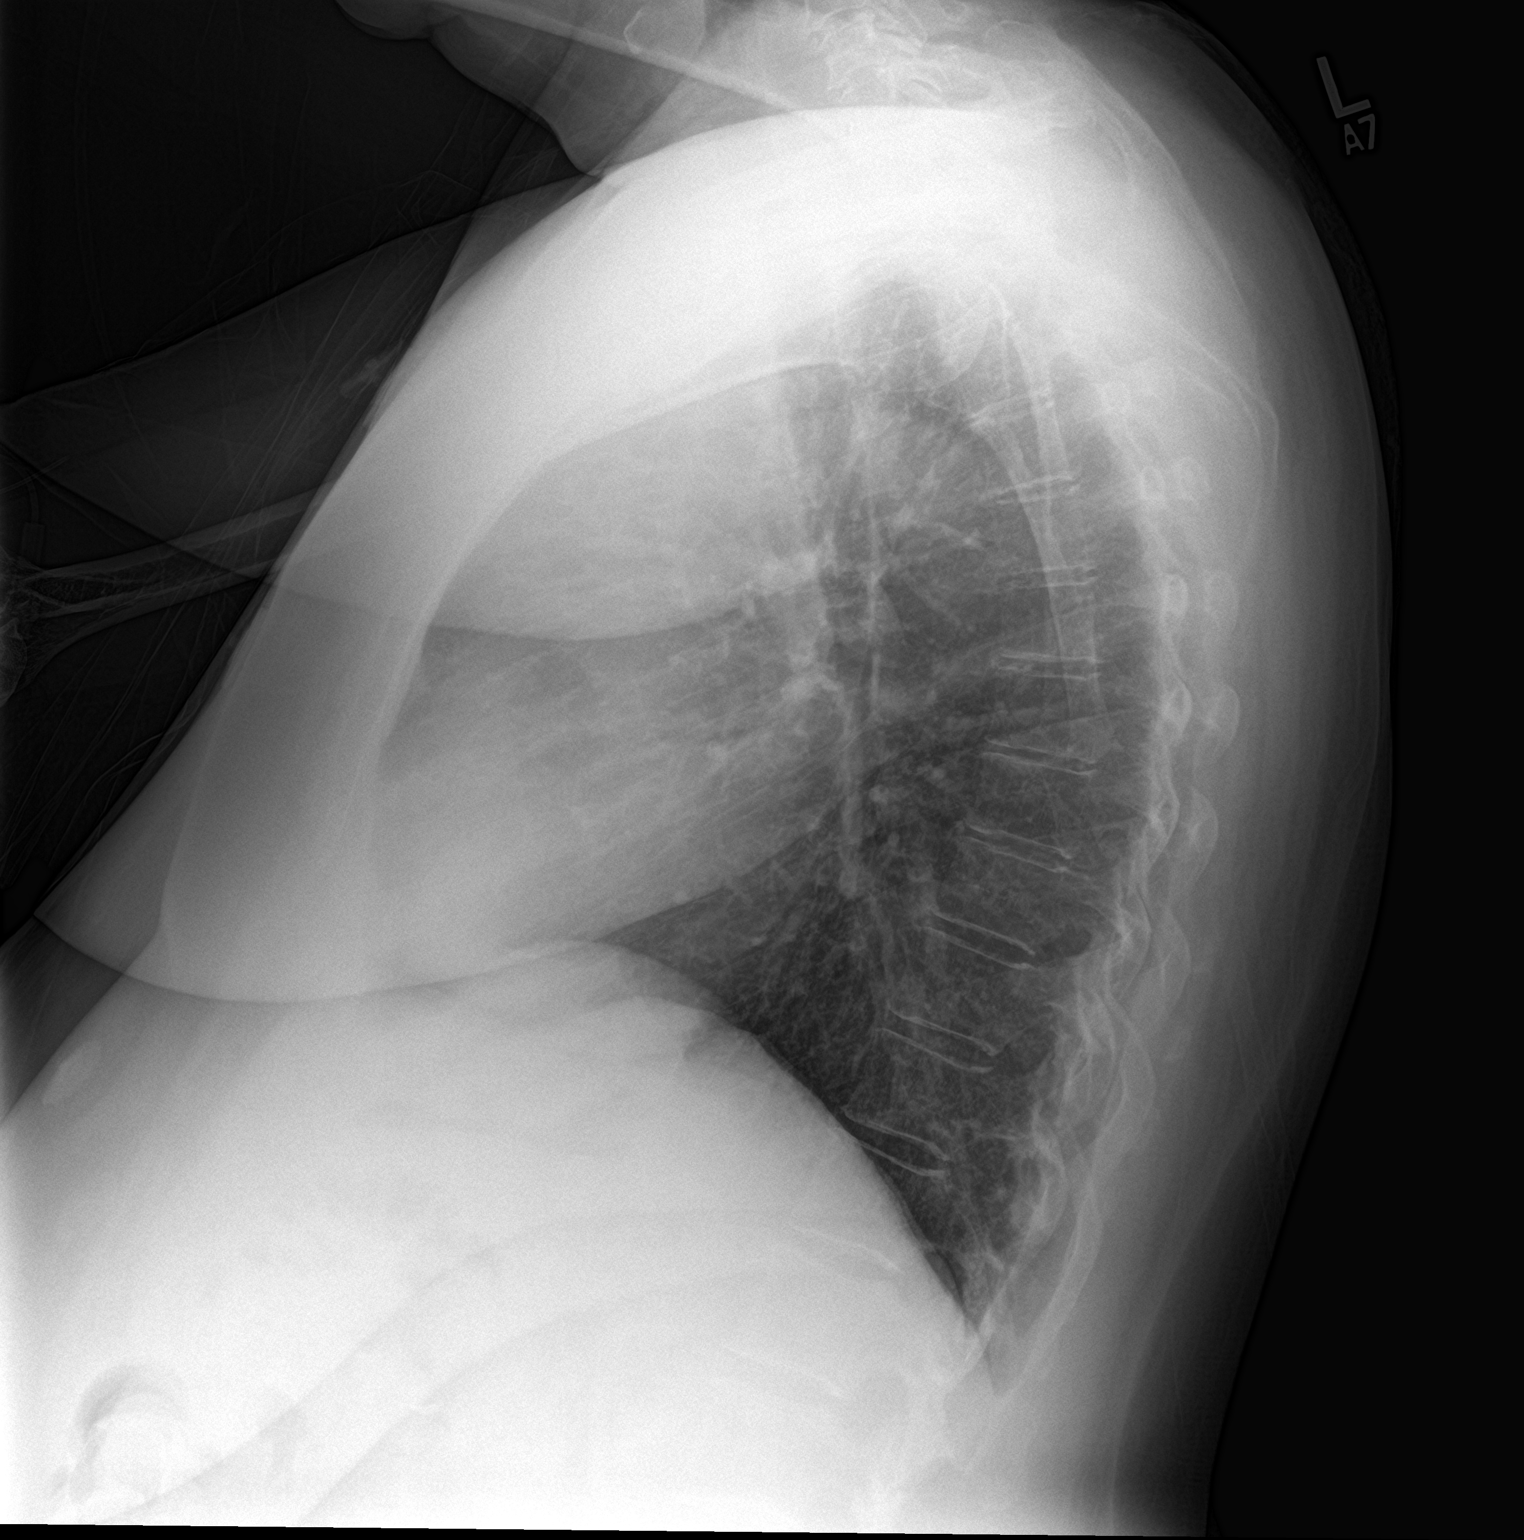

[2 of 2 positions shown; findings below may reference images not displayed]

FINDINGS: Mediastinum hilar structures normal. Lungs are clear. No pleural
effusion or pneumothorax. Heart size normal. Degenerative changes
thoracic spine .
IMPRESSION: No acute cardiopulmonary disease.

## 2017-11-09 DIAGNOSIS — E538 Deficiency of other specified B group vitamins: Secondary | ICD-10-CM | POA: Diagnosis not present

## 2017-11-14 DIAGNOSIS — R319 Hematuria, unspecified: Secondary | ICD-10-CM | POA: Diagnosis not present

## 2017-11-14 DIAGNOSIS — N390 Urinary tract infection, site not specified: Secondary | ICD-10-CM | POA: Diagnosis not present

## 2017-11-14 DIAGNOSIS — R399 Unspecified symptoms and signs involving the genitourinary system: Secondary | ICD-10-CM | POA: Diagnosis not present

## 2017-11-20 DIAGNOSIS — M9901 Segmental and somatic dysfunction of cervical region: Secondary | ICD-10-CM | POA: Diagnosis not present

## 2017-11-20 DIAGNOSIS — M47812 Spondylosis without myelopathy or radiculopathy, cervical region: Secondary | ICD-10-CM | POA: Diagnosis not present

## 2017-11-20 DIAGNOSIS — M542 Cervicalgia: Secondary | ICD-10-CM | POA: Diagnosis not present

## 2017-11-21 DIAGNOSIS — M542 Cervicalgia: Secondary | ICD-10-CM | POA: Diagnosis not present

## 2017-11-21 DIAGNOSIS — M47812 Spondylosis without myelopathy or radiculopathy, cervical region: Secondary | ICD-10-CM | POA: Diagnosis not present

## 2017-11-21 DIAGNOSIS — M9901 Segmental and somatic dysfunction of cervical region: Secondary | ICD-10-CM | POA: Diagnosis not present

## 2017-11-22 DIAGNOSIS — R300 Dysuria: Secondary | ICD-10-CM | POA: Diagnosis not present

## 2017-11-22 DIAGNOSIS — Z87440 Personal history of urinary (tract) infections: Secondary | ICD-10-CM | POA: Diagnosis not present

## 2017-11-22 DIAGNOSIS — R109 Unspecified abdominal pain: Secondary | ICD-10-CM | POA: Diagnosis not present

## 2017-11-22 DIAGNOSIS — R8290 Unspecified abnormal findings in urine: Secondary | ICD-10-CM | POA: Diagnosis not present

## 2017-11-24 DIAGNOSIS — R109 Unspecified abdominal pain: Secondary | ICD-10-CM | POA: Diagnosis not present

## 2017-11-24 DIAGNOSIS — R1031 Right lower quadrant pain: Secondary | ICD-10-CM | POA: Diagnosis not present

## 2017-11-27 DIAGNOSIS — M9901 Segmental and somatic dysfunction of cervical region: Secondary | ICD-10-CM | POA: Diagnosis not present

## 2017-11-27 DIAGNOSIS — M542 Cervicalgia: Secondary | ICD-10-CM | POA: Diagnosis not present

## 2017-11-27 DIAGNOSIS — M47812 Spondylosis without myelopathy or radiculopathy, cervical region: Secondary | ICD-10-CM | POA: Diagnosis not present

## 2017-11-28 DIAGNOSIS — M9901 Segmental and somatic dysfunction of cervical region: Secondary | ICD-10-CM | POA: Diagnosis not present

## 2017-11-28 DIAGNOSIS — M47812 Spondylosis without myelopathy or radiculopathy, cervical region: Secondary | ICD-10-CM | POA: Diagnosis not present

## 2017-11-28 DIAGNOSIS — M542 Cervicalgia: Secondary | ICD-10-CM | POA: Diagnosis not present

## 2017-12-01 DIAGNOSIS — D225 Melanocytic nevi of trunk: Secondary | ICD-10-CM | POA: Diagnosis not present

## 2017-12-01 DIAGNOSIS — D2261 Melanocytic nevi of right upper limb, including shoulder: Secondary | ICD-10-CM | POA: Diagnosis not present

## 2017-12-01 DIAGNOSIS — D2262 Melanocytic nevi of left upper limb, including shoulder: Secondary | ICD-10-CM | POA: Diagnosis not present

## 2017-12-04 DIAGNOSIS — M542 Cervicalgia: Secondary | ICD-10-CM | POA: Diagnosis not present

## 2017-12-04 DIAGNOSIS — M47812 Spondylosis without myelopathy or radiculopathy, cervical region: Secondary | ICD-10-CM | POA: Diagnosis not present

## 2017-12-04 DIAGNOSIS — M9901 Segmental and somatic dysfunction of cervical region: Secondary | ICD-10-CM | POA: Diagnosis not present

## 2017-12-11 DIAGNOSIS — M9901 Segmental and somatic dysfunction of cervical region: Secondary | ICD-10-CM | POA: Diagnosis not present

## 2017-12-11 DIAGNOSIS — M542 Cervicalgia: Secondary | ICD-10-CM | POA: Diagnosis not present

## 2017-12-11 DIAGNOSIS — M47812 Spondylosis without myelopathy or radiculopathy, cervical region: Secondary | ICD-10-CM | POA: Diagnosis not present

## 2017-12-14 ENCOUNTER — Ambulatory Visit: Payer: 59 | Admitting: Radiation Oncology

## 2017-12-18 DIAGNOSIS — M47812 Spondylosis without myelopathy or radiculopathy, cervical region: Secondary | ICD-10-CM | POA: Diagnosis not present

## 2017-12-18 DIAGNOSIS — M542 Cervicalgia: Secondary | ICD-10-CM | POA: Diagnosis not present

## 2017-12-18 DIAGNOSIS — M9901 Segmental and somatic dysfunction of cervical region: Secondary | ICD-10-CM | POA: Diagnosis not present

## 2017-12-21 ENCOUNTER — Ambulatory Visit: Payer: 59 | Admitting: Radiation Oncology

## 2017-12-26 DIAGNOSIS — E538 Deficiency of other specified B group vitamins: Secondary | ICD-10-CM | POA: Diagnosis not present

## 2018-01-03 ENCOUNTER — Ambulatory Visit
Admission: RE | Admit: 2018-01-03 | Discharge: 2018-01-03 | Disposition: A | Discharge status: Home / Self Care | Payer: 59 | Source: Ambulatory Visit | Attending: Radiation Oncology | Admitting: Radiation Oncology

## 2018-01-03 ENCOUNTER — Other Ambulatory Visit: Payer: Self-pay

## 2018-01-03 ENCOUNTER — Encounter: Payer: Self-pay | Admitting: Radiation Oncology

## 2018-01-03 VITALS — BP 139/91 | HR 84 | Temp 98.0°F | Resp 20 | Wt 256.0 lb

## 2018-01-03 DIAGNOSIS — R454 Irritability and anger: Secondary | ICD-10-CM | POA: Insufficient documentation

## 2018-01-03 DIAGNOSIS — Z923 Personal history of irradiation: Secondary | ICD-10-CM | POA: Insufficient documentation

## 2018-01-03 DIAGNOSIS — F419 Anxiety disorder, unspecified: Secondary | ICD-10-CM | POA: Insufficient documentation

## 2018-01-03 DIAGNOSIS — Z79811 Long term (current) use of aromatase inhibitors: Secondary | ICD-10-CM | POA: Insufficient documentation

## 2018-01-03 DIAGNOSIS — Z170 Estrogen receptor positive status [ER+]: Secondary | ICD-10-CM | POA: Insufficient documentation

## 2018-01-03 DIAGNOSIS — C50411 Malignant neoplasm of upper-outer quadrant of right female breast: Secondary | ICD-10-CM

## 2018-01-03 NOTE — Progress Notes (Signed)
Radiation Oncology Follow up Note  Name: Tammy Turner   Date:   01/03/2018 MRN:  [id number] DOB: Sep 20, 1960    This 57 y.o. female presents to the clinic today for 1 year follow-up status post whole breast radiation to her right breast for stage I invasive mammary carcinoma ER/PR positive.  REFERRING PROVIDER: Juluis Pitch, MD  HPI: patient is a 57 year old female now out 1 year having a please hold breast radiation to her right breast for stage I invasive mammary carcinoma. She seen today in routine follow-up and is doing well. She specifically denies breast tenderness cough or bone pain. She is currently on Aromasin which is causing her to feel anxious and ill tempered. She will see Dr. Tollie Pizza about possibly changing that prescription.she had mammograms back in May which I have reviewed were BI-RADS 2 benign  COMPLICATIONS OF TREATMENT: none  FOLLOW UP COMPLIANCE: keeps appointments   PHYSICAL EXAM:  BP (!) 139/91 (BP Location: Left Arm, Patient Position: Sitting)   Pulse 84   Temp 98 F (36.7 C) (Tympanic)   Resp 20   Wt 255 lb 15.3 oz (116.1 kg)   BMI 48.84 kg/m  Lungs are clear to A&P cardiac examination essentially unremarkable with regular rate and rhythm. No dominant mass or nodularity is noted in either breast in 2 positions examined. Incision is well-healed. No axillary or supraclavicular adenopathy is appreciated. Cosmetic result is excellent.Well-developed well-nourished patient in NAD. HEENT reveals PERLA, EOMI, discs not visualized.  Oral cavity is clear. No oral mucosal lesions are identified. Neck is clear without evidence of cervical or supraclavicular adenopathy. Lungs are clear to A&P. Cardiac examination is essentially unremarkable with regular rate and rhythm without murmur rub or thrill. Abdomen is benign with no organomegaly or masses noted. Motor sensory and DTR levels are equal and symmetric in the upper and lower extremities. Cranial nerves II through XII are  grossly intact. Proprioception is intact. No peripheral adenopathy or edema is identified. No motor or sensory levels are noted. Crude visual fields are within normal range.  RADIOLOGY RESULTS: mammograms reviewed and compatible with the above-stated findings  PLAN: present time patient is doing well with no evidence of disease. I am please were overall progress. I've asked to see her back in 1 year for follow-up. She will see Dr. Tollie Pizza about possibly changing her antiestrogen therapy. Patient is to call with any concerns at any time.  I would like to take this opportunity to thank you for allowing me to participate in the care of your patient.Noreene Filbert, MD

## 2018-01-22 DIAGNOSIS — M25531 Pain in right wrist: Secondary | ICD-10-CM | POA: Diagnosis not present

## 2018-01-26 DIAGNOSIS — M25531 Pain in right wrist: Secondary | ICD-10-CM | POA: Diagnosis not present

## 2018-01-29 DIAGNOSIS — G4733 Obstructive sleep apnea (adult) (pediatric): Secondary | ICD-10-CM | POA: Diagnosis not present

## 2018-02-06 DIAGNOSIS — E538 Deficiency of other specified B group vitamins: Secondary | ICD-10-CM | POA: Diagnosis not present

## 2018-02-08 ENCOUNTER — Ambulatory Visit (INDEPENDENT_AMBULATORY_CARE_PROVIDER_SITE_OTHER): Payer: 59 | Admitting: Obstetrics and Gynecology

## 2018-02-08 ENCOUNTER — Encounter: Payer: Self-pay | Admitting: Obstetrics and Gynecology

## 2018-02-08 VITALS — BP 150/90 | HR 83 | Ht 61.0 in | Wt 265.0 lb

## 2018-02-08 DIAGNOSIS — Z01419 Encounter for gynecological examination (general) (routine) without abnormal findings: Secondary | ICD-10-CM

## 2018-02-08 DIAGNOSIS — Z1239 Encounter for other screening for malignant neoplasm of breast: Secondary | ICD-10-CM

## 2018-02-08 DIAGNOSIS — C50411 Malignant neoplasm of upper-outer quadrant of right female breast: Secondary | ICD-10-CM

## 2018-02-08 DIAGNOSIS — N951 Menopausal and female climacteric states: Secondary | ICD-10-CM

## 2018-02-08 DIAGNOSIS — Z170 Estrogen receptor positive status [ER+]: Secondary | ICD-10-CM

## 2018-02-08 NOTE — Patient Instructions (Signed)
I value your feedback and entrusting us with your care. If you get a Reading patient survey, I would appreciate you taking the time to let us know about your experience today. Thank you!  [facility]: [phone]    

## 2018-02-08 NOTE — Progress Notes (Signed)
PCP: Juluis Pitch, MD   Chief Complaint  Patient presents with  . Gynecologic Exam    HPI:      [name] is a 58 y.o. [id number] who LMP was No LMP recorded. Patient has had a hysterectomy., presents today for her annual examination.  Her menses are absent and she is postmenopausal.  She does not have intermenstrual bleeding.  She does have vasomotor sx with aromasin for breast cancer.  Sex activity: single partner, contraception - post menopausal status. She does have vaginal dryness and uses coconut oil with sx relief.  Last Pap: 02/06/17  Results were: no abnormalities /neg HPV DNA.  Hx of STDs: none  Last mammogram: 06/15/17  was normal, repeat in 12 months. 2018 mammo was Cat 5. She had a bx with Dr. Bary Castilla and was found to have stage 1 breast cancer, ER+. She did excision with radiation, and started femara last yr. Changed to aromasin due to bad side effects with femara. Still having them but not as bad.   There is no FH of breast cancer. There is a FH of ovarian cancer in her MGM. Pt tested MyRisk neg 2016. The patient does do self-breast exams.  Colonoscopy: colonoscopy 3 years ago with abnormalities.  Repeat due after 5 years.   Tobacco use: The patient denies current or previous tobacco use. Alcohol use: none Exercise: moderately active  She does get adequate calcium and Vitamin D in her diet.  Labs with PCP.  Past Medical History:  Diagnosis Date  . Allergic state   . Anemia   . Breast cancer of upper-outer quadrant of right female breast ([place]) 06/2016   ER 95%, PR 90%, HER-2/neu not overexpressed.  . Complication of anesthesia    SEVERE MUSCLE ACHES FROM DIAPHRAGM TO THIGHS X 2 DAYS AFTER PARTIAL MASTECTOMY ON 07-25-16-PT WAS GIVEN SUCCINYLCHOLINE FOR INDUCTION  . Dyspnea on exertion    a. 09/2013 Echo: EF 60-65%, no rwma;  b. 11/2013 Myoview: EF 64%, no ischemia; c. 09/2016 Echo: EF 55-60%, no rwma.  . Family history of ovarian cancer    Pt is My Risk  cancer genetic testing neg 2016  . Genetic testing 2016   My Risk negative  . Gross hematuria   . Headache(784.0)   . Hyperlipidemia   . Hypertension   . Hypothyroidism   . Migraines   . Morbid obesity ([place])   . Osteopenia   . Osteopenia   . Pernicious anemia   . Sleep apnea 2010   uses CPAP  . Syncope    a. 09/2016 - seen in ED - limited w/u unrevealing; b. 09/2016 Echo: eF 55-60%, no rwma;  b. 10/2016 Event monitor:  no signficant arrhythmias. Occas pac's.    Past Surgical History:  Procedure Laterality Date  . ABDOMINAL HYSTERECTOMY  2011   [place]; Dr. Laurey Morale, due to leio/adenomyosis  . BREAST EXCISIONAL BIOPSY Right 07/25/2016   lumpectomy radation reexcison + anterior margins 08-08-16  . BREAST LUMPECTOMY Right 08/08/2016   Procedure: BREAST RE-EXCISION;  Surgeon: Robert Bellow, MD;  Location: [facility] ORS;  Service: General;  Laterality: Right;  . CESAREAN SECTION     x 2  . CHOLECYSTECTOMY    . COLONOSCOPY    . COLONOSCOPY WITH PROPOFOL N/A 10/05/2015   Procedure: COLONOSCOPY WITH PROPOFOL;  Surgeon: Manya Silvas, MD;  Location: [facility] ENDOSCOPY;  Service: Endoscopy;  Laterality: N/A;  . DILATION AND CURETTAGE OF UTERUS    . FUNCTIONAL ENDOSCOPIC SINUS SURGERY    .  PARTIAL MASTECTOMY WITH AXILLARY SENTINEL LYMPH NODE BIOPSY Right 07/25/2016   Procedure: PARTIAL MASTECTOMY WITH AXILLARY SENTINEL LYMPH NODE BIOPSY;  Surgeon: Robert Bellow, MD;  Location: [facility] ORS;  Service: General;  Laterality: Right;    Family History  Problem Relation Age of Onset  . Pulmonary embolism Mother   . Hypertension Mother   . Hyperlipidemia Mother   . Hypertension Father   . Colon cancer Father 37  . Ovarian cancer Maternal Grandmother        60  . Colon cancer Paternal Grandmother        47  . Colon cancer Maternal Grandfather 25  . Breast cancer Neg Hx     Social History   Socioeconomic History  . Marital status: Married    Spouse name: Not on file  . Number of children:  Not on file  . Years of education: Not on file  . Highest education level: Not on file  Occupational History  . Not on file  Social Needs  . Financial resource strain: Not on file  . Food insecurity:    Worry: Not on file    Inability: Not on file  . Transportation needs:    Medical: Not on file    Non-medical: Not on file  Tobacco Use  . Smoking status: Never Smoker  . Smokeless tobacco: Never Used  Substance and Sexual Activity  . Alcohol use: No  . Drug use: No  . Sexual activity: Yes    Birth control/protection: Surgical    Comment: Hysterectomy  Lifestyle  . Physical activity:    Days per week: Not on file    Minutes per session: Not on file  . Stress: Not on file  Relationships  . Social connections:    Talks on phone: Not on file    Gets together: Not on file    Attends religious service: Not on file    Active member of club or organization: Not on file    Attends meetings of clubs or organizations: Not on file    Relationship status: Not on file  . Intimate partner violence:    Fear of current or ex partner: Not on file    Emotionally abused: Not on file    Physically abused: Not on file    Forced sexual activity: Not on file  Other Topics Concern  . Not on file  Social History Narrative  . Not on file    Current Meds  Medication Sig  . ARMOUR THYROID 15 MG tablet Take 1 tablet by mouth daily.  . ASCORBIC ACID PO Take 2 tablets by mouth every evening.  Marland Kitchen CALCIUM PO Take by mouth.  . Carboxymethylcellulose Sodium (THERATEARS OP) Place 1 drop into both eyes 2 (two) times daily as needed (dry eyes).  . cholecalciferol (VITAMIN D) 1000 units tablet Take 1,000 Units by mouth every evening.  . Cyanocobalamin (VITAMIN B-12 IJ) Inject 1,000 mcg as directed every 30 (thirty) days.  . diclofenac (VOLTAREN) 50 MG EC tablet Take 50 mg by mouth 2 (two) times daily.   Marland Kitchen EPINEPHrine 0.3 mg/0.3 mL IJ SOAJ injection INJECT INTRAMUSCULARLY AS DIRECTED  . exemestane  (AROMASIN) 25 MG tablet Take 1 tablet (25 mg total) by mouth daily after breakfast.  . fluticasone (FLONASE) 50 MCG/ACT nasal spray Place 2 sprays into both nostrils daily.  . hydrochlorothiazide (HYDRODIURIL) 25 MG tablet hydrochlorothiazide 25 mg tablet  . losartan (COZAAR) 100 MG tablet losartan 100 mg tablet  . MAGNESIUM PO  Take by mouth.  . meloxicam (MOBIC) 15 MG tablet meloxicam 15 mg tablet  . potassium chloride SA (KLOR-CON M20) 20 MEQ tablet Klor-Con M20 mEq tablet,extended release  . thyroid (ARMOUR) 90 MG tablet Take 90 mg by mouth daily.  Marland Kitchen ZINC SULFATE PO Take by mouth.      ROS:  Review of Systems  Constitutional: Negative for fatigue, fever and unexpected weight change.  Respiratory: Negative for cough, shortness of breath and wheezing.   Cardiovascular: Negative for chest pain, palpitations and leg swelling.  Gastrointestinal: Negative for blood in stool, constipation, diarrhea, nausea and vomiting.  Endocrine: Negative for cold intolerance, heat intolerance and polyuria.  Genitourinary: Negative for dyspareunia, dysuria, flank pain, frequency, genital sores, hematuria, menstrual problem, pelvic pain, urgency, vaginal bleeding, vaginal discharge and vaginal pain.  Musculoskeletal: Negative for back pain, joint swelling and myalgias.  Skin: Negative for rash.  Neurological: Negative for dizziness, syncope, light-headedness, numbness and headaches.  Hematological: Negative for adenopathy.  Psychiatric/Behavioral: Positive for agitation and dysphoric mood. Negative for confusion, sleep disturbance and suicidal ideas. The patient is not nervous/anxious.      Objective: BP (!) 150/90   Pulse 83   Ht 5' 1"  (1.549 m)   Wt 265 lb (120.2 kg)   BMI 50.07 kg/m    Physical Exam Constitutional:      Appearance: She is well-developed.  Genitourinary:     Vulva, vagina, uterus, right adnexa and left adnexa normal.     No vulval lesion or tenderness noted.     No vaginal  discharge, erythema or tenderness.     No cervical motion tenderness or polyp.     Uterus is not enlarged or tender.     No right or left adnexal mass present.     Right adnexa not tender.     Left adnexa not tender.  Neck:     Musculoskeletal: Normal range of motion.     Thyroid: No thyromegaly.  Cardiovascular:     Rate and Rhythm: Normal rate and regular rhythm.     Heart sounds: Normal heart sounds. No murmur.  Pulmonary:     Effort: Pulmonary effort is normal.     Breath sounds: Normal breath sounds.  Chest:     Breasts:        Right: No mass, nipple discharge, skin change or tenderness.        Left: No mass, nipple discharge, skin change or tenderness.  Abdominal:     Palpations: Abdomen is soft.     Tenderness: There is no abdominal tenderness. There is no guarding.  Musculoskeletal: Normal range of motion.  Neurological:     Mental Status: She is alert and oriented to person, place, and time.     Cranial Nerves: No cranial nerve deficit.  Psychiatric:        Behavior: Behavior normal.  Vitals signs reviewed.     Assessment/Plan:  Encounter for annual routine gynecological examination  Screening for breast cancer - Pt sched wtih Dr. Bary Castilla - Plan: MM 3D SCREEN BREAST BILATERAL  Malignant neoplasm of upper-outer quadrant of right breast in female, estrogen receptor positive ([place]) - Plan: MM 3D SCREEN BREAST BILATERAL  Vasomotor symptoms due to menopause - Can't have ERT. Discussed effexor. Pt to consider and f/u prn. Also plans wt loss.        GYN counsel breast self exam, mammography screening, menopause, adequate intake of calcium and vitamin D, diet and exercise    F/U  Return in  about 1 year (around 02/09/2019).  Alicia B. Copland, PA-C 02/08/2018 1:57 PM

## 2018-03-06 ENCOUNTER — Ambulatory Visit (INDEPENDENT_AMBULATORY_CARE_PROVIDER_SITE_OTHER): Payer: 59 | Admitting: General Surgery

## 2018-03-06 ENCOUNTER — Other Ambulatory Visit: Payer: Self-pay

## 2018-03-06 ENCOUNTER — Encounter: Payer: Self-pay | Admitting: General Surgery

## 2018-03-06 ENCOUNTER — Ambulatory Visit (INDEPENDENT_AMBULATORY_CARE_PROVIDER_SITE_OTHER): Payer: 59

## 2018-03-06 VITALS — BP 143/93 | HR 92 | Temp 97.7°F | Resp 16 | Ht 61.0 in | Wt 262.2 lb

## 2018-03-06 DIAGNOSIS — N6341 Unspecified lump in right breast, subareolar: Secondary | ICD-10-CM

## 2018-03-06 DIAGNOSIS — Z170 Estrogen receptor positive status [ER+]: Secondary | ICD-10-CM

## 2018-03-06 DIAGNOSIS — C50411 Malignant neoplasm of upper-outer quadrant of right female breast: Secondary | ICD-10-CM

## 2018-03-06 DIAGNOSIS — N390 Urinary tract infection, site not specified: Secondary | ICD-10-CM | POA: Diagnosis not present

## 2018-03-06 DIAGNOSIS — I10 Essential (primary) hypertension: Secondary | ICD-10-CM | POA: Diagnosis not present

## 2018-03-06 DIAGNOSIS — E039 Hypothyroidism, unspecified: Secondary | ICD-10-CM | POA: Diagnosis not present

## 2018-03-06 NOTE — Assessment & Plan Note (Signed)
T1b, N0, ER/PR positive, HER-2/neu negative. ER 90%, PR 95%, PR 90%, HER-2/neu not overexpressed. 

## 2018-03-06 NOTE — Patient Instructions (Signed)
The patient is aware to call back for any questions or concerns.  

## 2018-03-06 NOTE — Progress Notes (Signed)
Patient ID: Tammy Turner, female   DOB: 20-Mar-1960, 58 y.o.   MRN: [id number]  Chief Complaint  Patient presents with  . Follow-up    new right breast mass    HPI Tammy Turner is a 58 y.o. female.  who presents for a new right breast mass found during her regular breast exam last week.   Last mammogram was 06-16-18. She is taking Aromasin and tolerating this fairly well although she is still having significant hot flashes.  The migraines experience with Femara have not occurred although she is still having some myalgias.  All in all better tolerated although she may think about a final change to tamoxifen.    HPI  Past Medical History:  Diagnosis Date  . Allergic state   . Anemia   . Breast cancer of upper-outer quadrant of right female breast ([place]) 06/2016   ER 95%, PR 90%, HER-2/neu not overexpressed.  . Complication of anesthesia    SEVERE MUSCLE ACHES FROM DIAPHRAGM TO THIGHS X 2 DAYS AFTER PARTIAL MASTECTOMY ON 07-25-16-PT WAS GIVEN SUCCINYLCHOLINE FOR INDUCTION  . Dyspnea on exertion    a. 09/2013 Echo: EF 60-65%, no rwma;  b. 11/2013 Myoview: EF 64%, no ischemia; c. 09/2016 Echo: EF 55-60%, no rwma.  . Family history of ovarian cancer    Pt is My Risk cancer genetic testing neg 2016  . Genetic testing 2016   My Risk negative  . Gross hematuria   . Headache(784.0)   . Hyperlipidemia   . Hypertension   . Hypothyroidism   . Migraines   . Morbid obesity ([place])   . Osteopenia   . Osteopenia   . Pernicious anemia   . Sleep apnea 2010   uses CPAP  . Syncope    a. 09/2016 - seen in ED - limited w/u unrevealing; b. 09/2016 Echo: eF 55-60%, no rwma;  b. 10/2016 Event monitor:  no signficant arrhythmias. Occas pac's.    Past Surgical History:  Procedure Laterality Date  . ABDOMINAL HYSTERECTOMY  2011   [place]; Dr. Laurey Morale, due to leio/adenomyosis  . BREAST EXCISIONAL BIOPSY Right 07/25/2016   lumpectomy radation reexcison + anterior margins 08-08-16  . BREAST LUMPECTOMY Right  08/08/2016   Procedure: BREAST RE-EXCISION;  Surgeon: Robert Bellow, MD;  Location: [facility] ORS;  Service: General;  Laterality: Right;  . CESAREAN SECTION     x 2  . CHOLECYSTECTOMY    . COLONOSCOPY    . COLONOSCOPY WITH PROPOFOL N/A 10/05/2015   Procedure: COLONOSCOPY WITH PROPOFOL;  Surgeon: Manya Silvas, MD;  Location[facility] END[facility]COPY;  Service: Endoscopy;  Laterality: N/A;  . DILATION AND CURETTAGE OF UTERUS    . FUNCTIONAL ENDOSCOPIC SINUS SURGERY    . PARTIAL MASTECTOMY WITH AXILLARY SENTINEL LYMPH NODE BIOPSY Right 07/25/2016   Procedure: PARTIAL MASTECTOMY WITH AXILLARY SENTINEL LYMPH NODE BIOPSY;  Surgeon: Robert Bellow, MD;  Location: [facility] ORS;  Service: General;  Laterality: Right;    Family History  Problem Relation Age of Onset  . Pulmonary embolism Mother   . Hypertension Mother   . Hyperlipidemia Mother   . Hypertension Father   . Colon cancer Father 27  . Ovarian cancer Maternal Grandmother        30  . Colon cancer Paternal Grandmother        66  . Colon cancer Maternal Grandfather 37  . Breast cancer Neg Hx     Social History Social History   Tobacco Use  . Smoking status: Never  Smoker  . Smokeless tobacco: Never Used  Substance Use Topics  . Alcohol use: No  . Drug use: No    Allergies  Allergen Reactions  . Morphine Other (See Comments)    Depressed respirations  . Niacin Anaphylaxis  . Septra [Sulfamethoxazole-Trimethoprim] Other (See Comments)    Fever and joint pain  . Doxycycline Hives  . Iodine Hives and Itching  . Ivp Dye [Iodinated Diagnostic Agents] Hives and Itching  . Levaquin [Levofloxacin In D5w] Other (See Comments)    Tendonitis   . Cephalexin Rash  . Penicillins Hives and Rash    Has patient had a PCN reaction causing immediate rash, facial/tongue/throat swelling, SOB or lightheadedness with hypotension: No Has patient had a PCN reaction causing severe rash involving mucus membranes or skin necrosis: No Has patient had  a PCN reaction that required hospitalization: No Has patient had a PCN reaction occurring within the last 10 years: Yes If all of the above answers are "NO", then may proceed with Cephalosporin use.     Current Outpatient Medications  Medication Sig Dispense Refill  . ARMOUR THYROID 15 MG tablet Take 1 tablet by mouth daily.  0  . ASCORBIC ACID PO Take 2 tablets by mouth every evening.    Marland Kitchen CALCIUM PO Take by mouth.    . Carboxymethylcellulose Sodium (THERATEARS OP) Place 1 drop into both eyes 2 (two) times daily as needed (dry eyes).    . cholecalciferol (VITAMIN D) 1000 units tablet Take 1,000 Units by mouth every evening.    . Cyanocobalamin (VITAMIN B-12 IJ) Inject 1,000 mcg as directed every 30 (thirty) days.    Marland Kitchen EPINEPHrine 0.3 mg/0.3 mL IJ SOAJ injection INJECT INTRAMUSCULARLY AS DIRECTED    . exemestane (AROMASIN) 25 MG tablet Take 1 tablet (25 mg total) by mouth daily after breakfast. 90 tablet 4  . hydrochlorothiazide (HYDRODIURIL) 25 MG tablet hydrochlorothiazide 25 mg tablet    . losartan (COZAAR) 100 MG tablet losartan 100 mg tablet    . MAGNESIUM PO Take by mouth.    . potassium chloride SA (KLOR-CON M20) 20 MEQ tablet Klor-Con M20 mEq tablet,extended release    . thyroid (ARMOUR) 90 MG tablet Take 90 mg by mouth daily.    Marland Kitchen ZINC SULFATE PO Take by mouth.     No current facility-administered medications for this visit.     Review of Systems Review of Systems  Constitutional: Negative.   Respiratory: Negative.   Cardiovascular: Negative.     Blood pressure (!) 143/93, pulse 92, temperature 97.7 F (36.5 C), temperature source Temporal, resp. rate 16, height 5' 1"  (1.549 m), weight 262 lb 3.2 oz (118.9 kg).  Physical Exam Physical Exam Constitutional:      Appearance: Normal appearance.  Neck:     Musculoskeletal: Normal range of motion and neck supple.  Cardiovascular:     Rate and Rhythm: Normal rate and regular rhythm.  Pulmonary:     Effort: Pulmonary  effort is normal.     Breath sounds: Normal breath sounds.  Chest:    Lymphadenopathy:     Cervical: No cervical adenopathy.     Upper Body:     Right upper body: No supraclavicular or axillary adenopathy.     Left upper body: No supraclavicular or axillary adenopathy.  Neurological:     Mental Status: She is alert.     Data Reviewed May 2019 mammograms reviewed.  BI-RADS-2.  Ultrasound examination of the upper outer quadrant of the right breast in  the area of palpable thickening 2 cm of the nipple at the 10 o'clock position showed a 0.61 x 0.69 x 0.88 heterogeneous hypoechoic nodule with some focal posterior acoustic enhancement.  This is likely related to prior inflammatory process at the wide excision site and subsequent seroma drainage site.  BI-RADS-3.  The patient was amenable to FNA sampling.  1 cc of 1% plain Xylocaine was utilized and from a lateral to medial approach a 22-gauge needle was used and multiple passes through the lesion were undertaken.  No bleeding was noted.  The procedure was well-tolerated.  Band-Aid applied.   Slides x2 were prepared for cytopathology.  Assessment    New focal nodularity in the breast within the field of prior wide excision, mastoplasty and subsequent whole breast radiation.    Plan    Plans to be made based on the upcoming cytology results.       Forest Gleason Tammy Turner 03/06/2018, 8:43 PM

## 2018-03-08 ENCOUNTER — Telehealth: Payer: Self-pay | Admitting: General Surgery

## 2018-03-08 NOTE — Telephone Encounter (Signed)
Cytology report from the left retroareolar area did not show any malignant cells.  Patient is notified of the results.  We will plan for a follow-up exam in May 2020 unless she notices significant change in this lesion.

## 2018-03-27 DIAGNOSIS — E538 Deficiency of other specified B group vitamins: Secondary | ICD-10-CM | POA: Diagnosis not present

## 2018-03-27 DIAGNOSIS — E039 Hypothyroidism, unspecified: Secondary | ICD-10-CM | POA: Diagnosis not present

## 2018-04-05 DIAGNOSIS — M9901 Segmental and somatic dysfunction of cervical region: Secondary | ICD-10-CM | POA: Diagnosis not present

## 2018-04-05 DIAGNOSIS — M47812 Spondylosis without myelopathy or radiculopathy, cervical region: Secondary | ICD-10-CM | POA: Diagnosis not present

## 2018-04-05 DIAGNOSIS — M542 Cervicalgia: Secondary | ICD-10-CM | POA: Diagnosis not present

## 2018-04-09 DIAGNOSIS — M47812 Spondylosis without myelopathy or radiculopathy, cervical region: Secondary | ICD-10-CM | POA: Diagnosis not present

## 2018-04-09 DIAGNOSIS — M542 Cervicalgia: Secondary | ICD-10-CM | POA: Diagnosis not present

## 2018-04-09 DIAGNOSIS — M9901 Segmental and somatic dysfunction of cervical region: Secondary | ICD-10-CM | POA: Diagnosis not present

## 2018-04-30 DIAGNOSIS — G4733 Obstructive sleep apnea (adult) (pediatric): Secondary | ICD-10-CM | POA: Diagnosis not present

## 2018-05-15 DIAGNOSIS — H109 Unspecified conjunctivitis: Secondary | ICD-10-CM | POA: Diagnosis not present

## 2018-05-18 ENCOUNTER — Encounter: Payer: Self-pay | Admitting: General Surgery

## 2018-05-28 DIAGNOSIS — E039 Hypothyroidism, unspecified: Secondary | ICD-10-CM | POA: Diagnosis not present

## 2018-06-04 ENCOUNTER — Other Ambulatory Visit: Payer: Self-pay

## 2018-06-04 DIAGNOSIS — C50411 Malignant neoplasm of upper-outer quadrant of right female breast: Secondary | ICD-10-CM

## 2018-06-04 DIAGNOSIS — Z170 Estrogen receptor positive status [ER+]: Principal | ICD-10-CM

## 2018-07-09 ENCOUNTER — Encounter: Payer: Self-pay | Admitting: General Surgery

## 2018-08-07 ENCOUNTER — Ambulatory Visit
Admission: RE | Admit: 2018-08-07 | Discharge: 2018-08-07 | Disposition: A | Discharge status: Home / Self Care | Payer: 59 | Source: Ambulatory Visit | Attending: General Surgery | Admitting: General Surgery

## 2018-08-07 ENCOUNTER — Other Ambulatory Visit: Payer: Self-pay

## 2018-08-07 DIAGNOSIS — C50411 Malignant neoplasm of upper-outer quadrant of right female breast: Secondary | ICD-10-CM | POA: Diagnosis not present

## 2018-08-07 DIAGNOSIS — Z170 Estrogen receptor positive status [ER+]: Secondary | ICD-10-CM | POA: Insufficient documentation

## 2018-08-07 HISTORY — DX: Personal history of irradiation: Z92.3

## 2018-08-14 ENCOUNTER — Ambulatory Visit (INDEPENDENT_AMBULATORY_CARE_PROVIDER_SITE_OTHER): Payer: 59 | Admitting: General Surgery

## 2018-08-14 ENCOUNTER — Other Ambulatory Visit: Payer: Self-pay

## 2018-08-14 VITALS — BP 130/80 | HR 97 | Temp 98.1°F | Ht 61.0 in | Wt 279.0 lb

## 2018-08-14 DIAGNOSIS — Z170 Estrogen receptor positive status [ER+]: Secondary | ICD-10-CM

## 2018-08-14 DIAGNOSIS — C50411 Malignant neoplasm of upper-outer quadrant of right female breast: Secondary | ICD-10-CM

## 2018-08-14 NOTE — Patient Instructions (Addendum)
The patient has been asked to return to the office in one year with a bilateral diagnostic mammogram.The patient is aware to call back for any questions or concerns.  Bone density

## 2018-08-14 NOTE — Progress Notes (Signed)
Patient ID: Tammy Turner, female   DOB: [birth year], 59 y.o.   MRN: [id number]  Chief Complaint  Patient presents with  . Follow-up    HPI Tammy Turner is a 58 y.o. female who presents for a breast evaluation. The most recent mammogram was done on 08/07/2018. Patient does perform regular self breast checks and gets regular mammograms done.   She does report some mild lymphedema in the right arm.  The patient reports she notices swelling in the right breast in the evening hours.  HPI  Past Medical History:  Diagnosis Date  . Allergic state   . Anemia   . Breast cancer of upper-outer quadrant of right female breast ([place]) 06/2016   ER 95%, PR 90%, HER-2/neu not overexpressed.  . Complication of anesthesia    SEVERE MUSCLE ACHES FROM DIAPHRAGM TO THIGHS X 2 DAYS AFTER PARTIAL MASTECTOMY ON 07-25-16-PT WAS GIVEN SUCCINYLCHOLINE FOR INDUCTION  . Dyspnea on exertion    a. 09/2013 Echo: EF 60-65%, no rwma;  b. 11/2013 Myoview: EF 64%, no ischemia; c. 09/2016 Echo: EF 55-60%, no rwma.  . Family history of ovarian cancer    Pt is My Risk cancer genetic testing neg 2016  . Genetic testing 2016   My Risk negative  . Gross hematuria   . Headache(784.0)   . Hyperlipidemia   . Hypertension   . Hypothyroidism   . Migraines   . Morbid obesity ([place])   . Osteopenia   . Osteopenia   . Pernicious anemia   . Personal history of radiation therapy   . Sleep apnea 2010   uses CPAP  . Syncope    a. 09/2016 - seen in ED - limited w/u unrevealing; b. 09/2016 Echo: eF 55-60%, no rwma;  b. 10/2016 Event monitor:  no signficant arrhythmias. Occas pac's.    Past Surgical History:  Procedure Laterality Date  . ABDOMINAL HYSTERECTOMY  2011   [place]; Dr. Laurey Morale, due to leio/adenomyosis  . BREAST EXCISIONAL BIOPSY Right 07/25/2016   lumpectomy radation reexcison + anterior margins 08-08-16  . BREAST LUMPECTOMY Right 08/08/2016   Procedure: BREAST RE-EXCISION;  Surgeon: Robert Bellow, MD;  Location: [facility] ORS;   Service: General;  Laterality: Right;  . CESAREAN SECTION     x 2  . CHOLECYSTECTOMY    . COLONOSCOPY    . COLONOSCOPY WITH PROPOFOL N/A 10/05/2015   Procedure: COLONOSCOPY WITH PROPOFOL;  Surgeon: Manya Silvas, MD;  Location: [facility] ENDOSCOPY;  Service: Endoscopy;  Laterality: N/A;  . DILATION AND CURETTAGE OF UTERUS    . FUNCTIONAL ENDOSCOPIC SINUS SURGERY    . PARTIAL MASTECTOMY WITH AXILLARY SENTINEL LYMPH NODE BIOPSY Right 07/25/2016   Procedure: PARTIAL MASTECTOMY WITH AXILLARY SENTINEL LYMPH NODE BIOPSY;  Surgeon: Robert Bellow, MD;  Location: [facility] ORS;  Service: General;  Laterality: Right;    Family History  Problem Relation Age of Onset  . Pulmonary embolism Mother   . Hypertension Mother   . Hyperlipidemia Mother   . Hypertension Father   . Colon cancer Father 42  . Ovarian cancer Maternal Grandmother        6  . Colon cancer Paternal Grandmother        51  . Colon cancer Maternal Grandfather 38  . Breast cancer Neg Hx     Social History Social History   Tobacco Use  . Smoking status: Never Smoker  . Smokeless tobacco: Never Used  Substance Use Topics  . Alcohol use: No  . Drug use:  No    Allergies  Allergen Reactions  . Morphine Other (See Comments)    Depressed respirations  . Niacin Anaphylaxis  . Septra [Sulfamethoxazole-Trimethoprim] Other (See Comments)    Fever and joint pain  . Doxycycline Hives  . Iodine Hives and Itching  . Ivp Dye [Iodinated Diagnostic Agents] Hives and Itching  . Levaquin [Levofloxacin In D5w] Other (See Comments)    Tendonitis   . Cephalexin Rash  . Penicillins Hives and Rash    Has patient had a PCN reaction causing immediate rash, facial/tongue/throat swelling, SOB or lightheadedness with hypotension: No Has patient had a PCN reaction causing severe rash involving mucus membranes or skin necrosis: No Has patient had a PCN reaction that required hospitalization: No Has patient had a PCN reaction occurring within  the last 10 years: Yes If all of the above answers are "NO", then may proceed with Cephalosporin use.     Current Outpatient Medications  Medication Sig Dispense Refill  . ARMOUR THYROID 15 MG tablet Take 1 tablet by mouth daily.  0  . ASCORBIC ACID PO Take 2 tablets by mouth every evening.    Marland Kitchen CALCIUM PO Take by mouth.    . Carboxymethylcellulose Sodium (THERATEARS OP) Place 1 drop into both eyes 2 (two) times daily as needed (dry eyes).    . cholecalciferol (VITAMIN D) 1000 units tablet Take 1,000 Units by mouth every evening.    . Cyanocobalamin (VITAMIN B-12 IJ) Inject 1,000 mcg as directed every 30 (thirty) days.    Marland Kitchen EPINEPHrine 0.3 mg/0.3 mL IJ SOAJ injection INJECT INTRAMUSCULARLY AS DIRECTED    . exemestane (AROMASIN) 25 MG tablet Take 1 tablet (25 mg total) by mouth daily after breakfast. 90 tablet 4  . hydrochlorothiazide (HYDRODIURIL) 25 MG tablet hydrochlorothiazide 25 mg tablet    . losartan (COZAAR) 100 MG tablet losartan 100 mg tablet    . MAGNESIUM PO Take by mouth.    . potassium chloride SA (KLOR-CON M20) 20 MEQ tablet Klor-Con M20 mEq tablet,extended release    . thyroid (ARMOUR) 90 MG tablet Take 90 mg by mouth daily.    Marland Kitchen ZINC SULFATE PO Take by mouth.     No current facility-administered medications for this visit.     Review of Systems Review of Systems  Constitutional: Negative.   Respiratory: Negative.   Cardiovascular: Negative.     Blood pressure 130/80, pulse 97, temperature 98.1 F (36.7 C), height 5' 1"  (1.549 m), weight 279 lb (126.6 kg), SpO2 99 %.  Physical Exam Physical Exam Exam conducted with a chaperone present.  Constitutional:      Appearance: She is well-developed.  Eyes:     General: No scleral icterus.    Conjunctiva/sclera: Conjunctivae normal.  Neck:     Musculoskeletal: Neck supple.  Cardiovascular:     Rate and Rhythm: Normal rate and regular rhythm.     Heart sounds: Normal heart sounds.  Pulmonary:     Effort:  Pulmonary effort is normal.     Breath sounds: Normal breath sounds.  Chest:     Breasts:        Left: Normal.    Musculoskeletal:       Arms:  Lymphadenopathy:     Cervical: No cervical adenopathy.     Upper Body:     Right upper body: No supraclavicular or axillary adenopathy.     Left upper body: No supraclavicular or axillary adenopathy.  Skin:    General: Skin is warm and dry.  Neurological:     Mental Status: She is alert and oriented to person, place, and time.     Data Reviewed Bilateral diagnostic mammogram dated August 07, 2018 was independently reviewed.  BI-RADS-2.  Assessment Stable vasomotor symptoms on Aromasin.  No evidence of recurrent cancer.  Mild forearm swelling.  Plan   Schedule Bone Density\ 09/20/2018 @11 :00  The patient has been asked to return to the office in one year with a bilateral diagnostic mammogram.The patient is aware to call back for any questions or concerns.   HPI, Physical Exam, Assessment and Plan have been scribed under the direction and in the presence of Tammy Ard, MD.  Tammy Turner, CMA  Tammy Turner 08/15/2018, 9:30 PM

## 2018-09-07 ENCOUNTER — Encounter: Payer: Self-pay | Admitting: General Surgery

## 2018-09-20 ENCOUNTER — Other Ambulatory Visit: Payer: Self-pay

## 2018-09-20 ENCOUNTER — Ambulatory Visit
Admission: RE | Admit: 2018-09-20 | Discharge: 2018-09-20 | Disposition: A | Discharge status: Home / Self Care | Payer: 59 | Source: Ambulatory Visit | Attending: General Surgery | Admitting: General Surgery

## 2018-09-20 DIAGNOSIS — Z170 Estrogen receptor positive status [ER+]: Secondary | ICD-10-CM | POA: Diagnosis present

## 2018-09-20 DIAGNOSIS — C50411 Malignant neoplasm of upper-outer quadrant of right female breast: Secondary | ICD-10-CM | POA: Insufficient documentation

## 2018-10-24 DIAGNOSIS — Z853 Personal history of malignant neoplasm of breast: Secondary | ICD-10-CM | POA: Insufficient documentation

## 2018-12-17 IMAGING — MG MM DIGITAL SCREENING BILAT W/ TOMO W/ CAD
8 of 12 series · 8 of 28 positions shown · non-contrast
Comparison: Previous exam(s).

CLINICAL DATA: Screening.

EXAM:
2D DIGITAL SCREENING BILATERAL MAMMOGRAM WITH CAD AND ADJUNCT TOMO

[R MLO synth-2D]
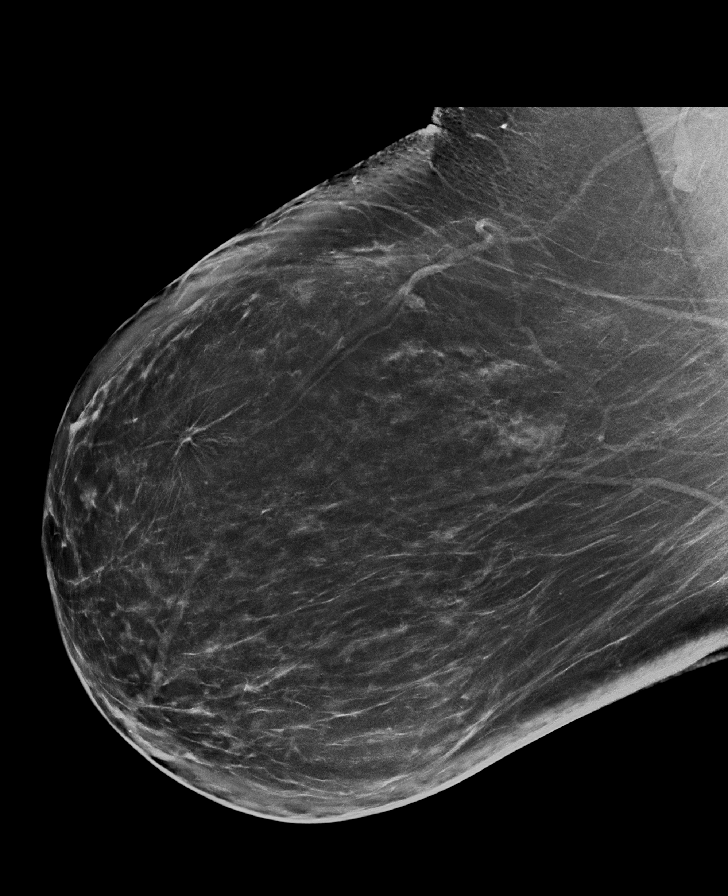

[R CC synth-2D]
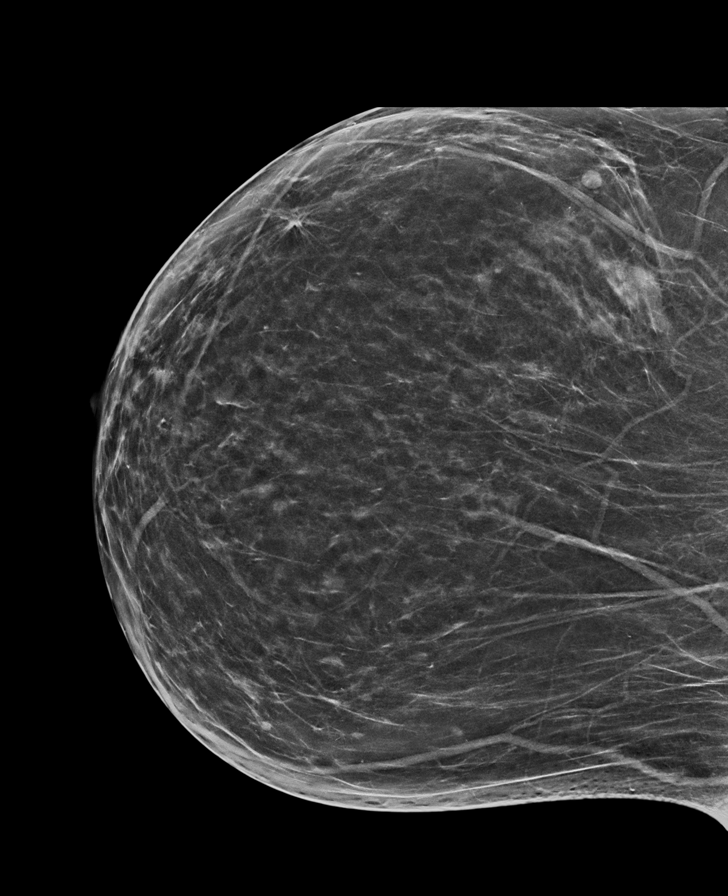

[L CC synth-2D]
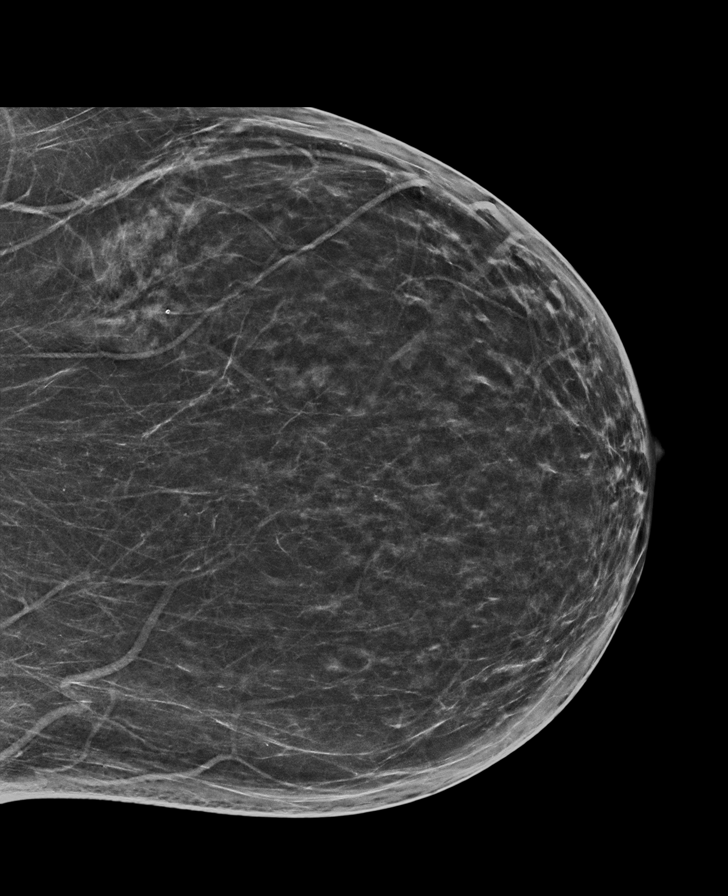

[L CC]
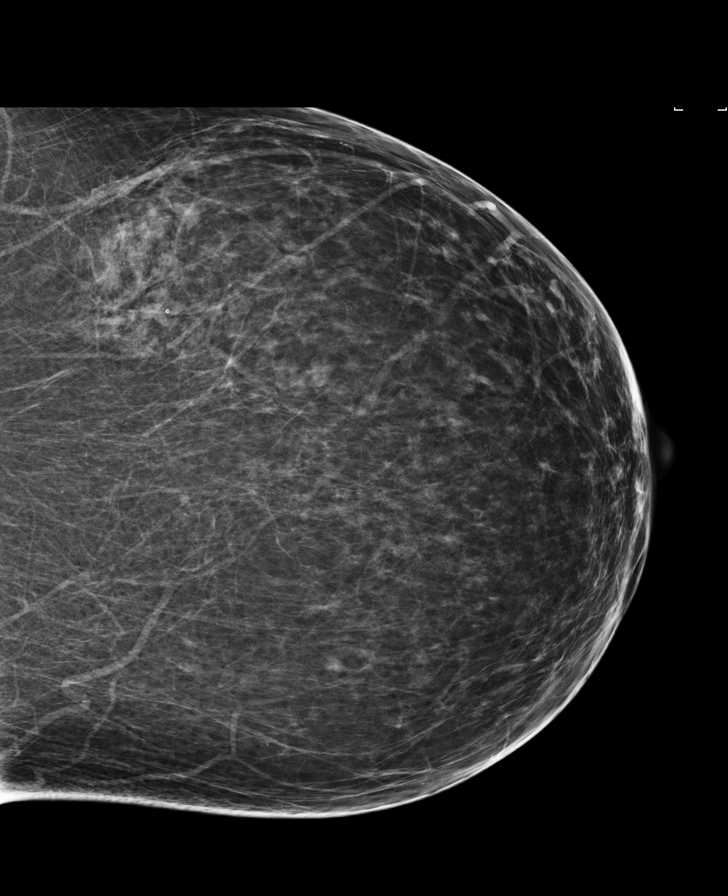

[L MLO synth-2D]
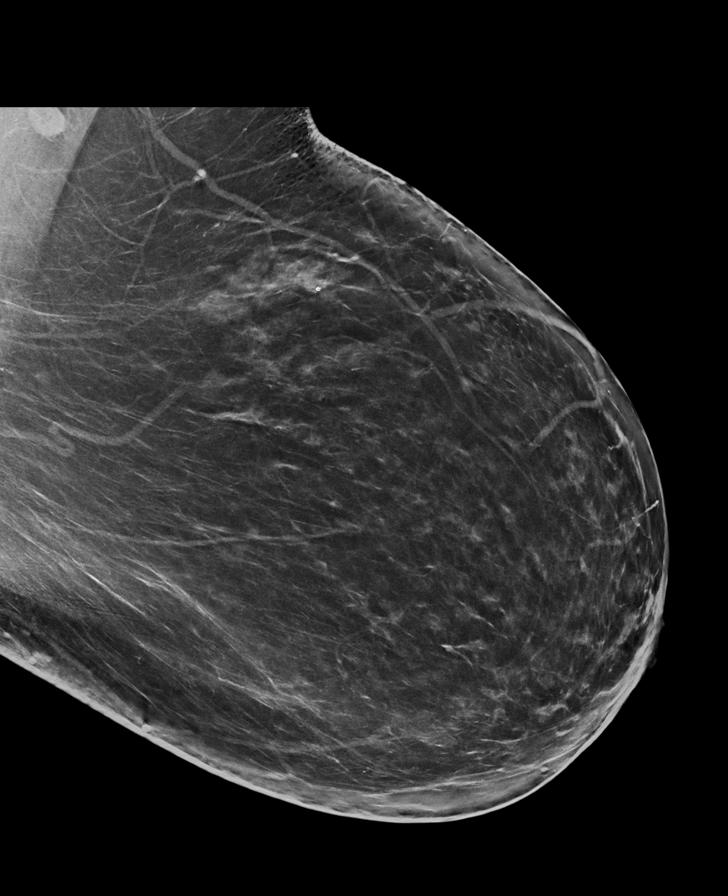

[R CC]
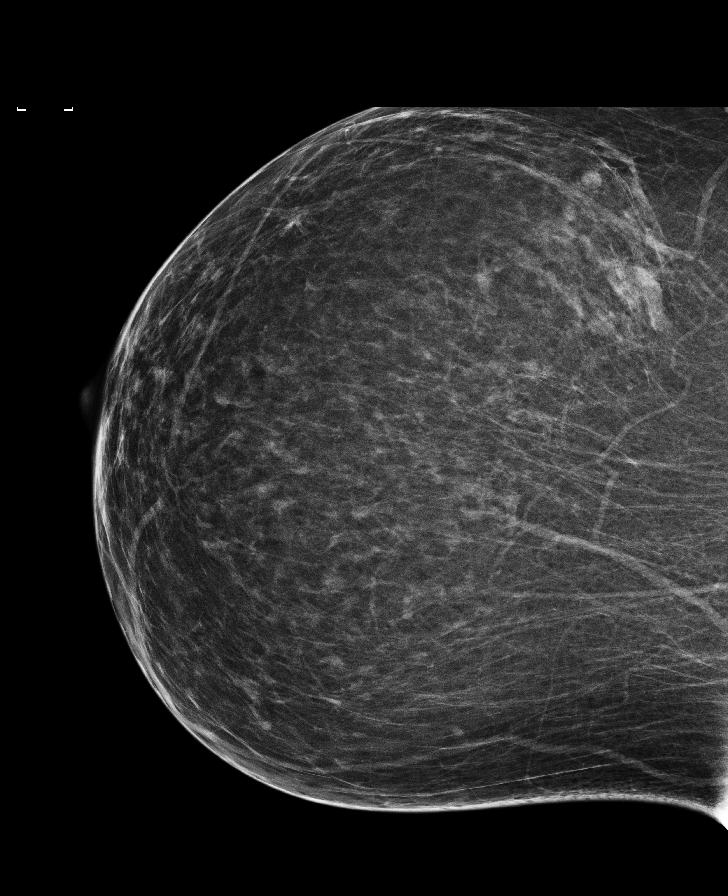

[R MLO]
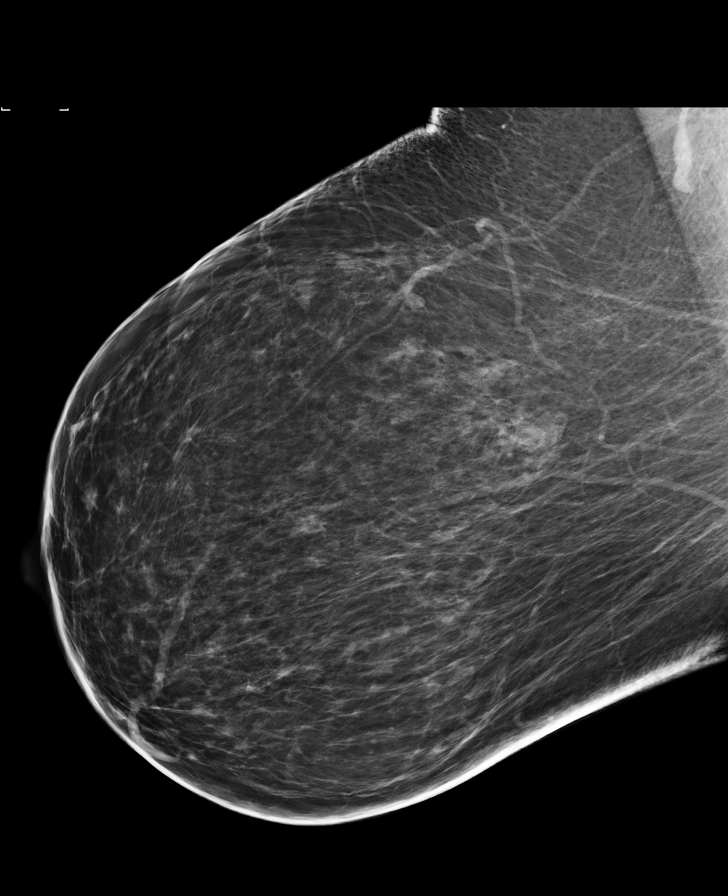

[L MLO]
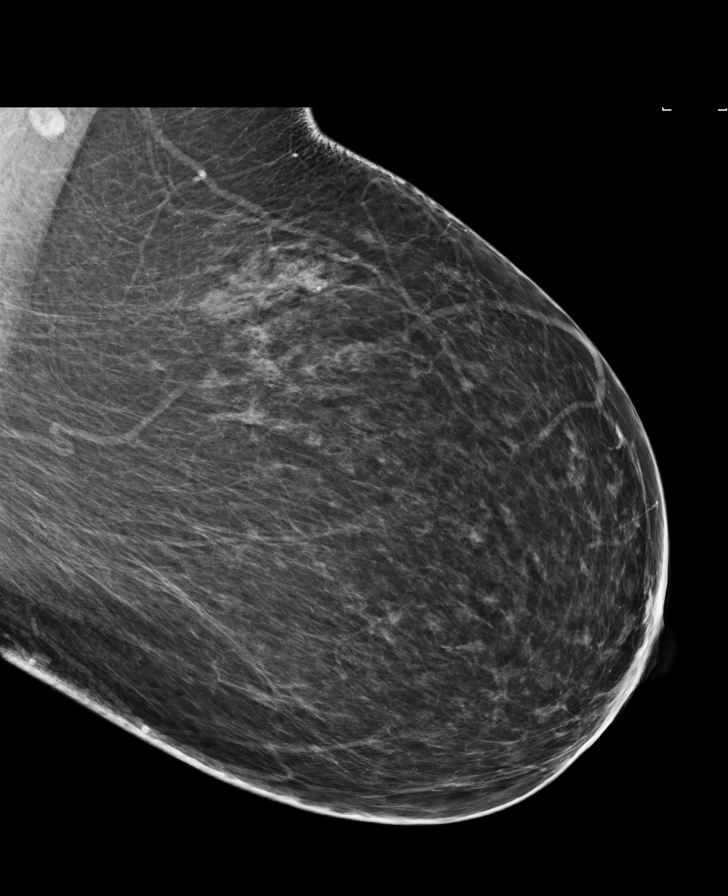

[8 of 28 positions shown; findings below may reference images not displayed]

ACR Breast Density Category b: There are scattered areas of
fibroglandular density.
FINDINGS: In the right breast, possible distortion warrants further
evaluation. In the left breast, no findings suspicious for
malignancy. Images were processed with CAD.
IMPRESSION: Further evaluation is suggested for possible distortion in the right
breast.

RECOMMENDATION:
Diagnostic mammogram and possibly ultrasound of the right breast.
(Code:[id number]-J-66C)

The patient will be contacted regarding the findings, and additional
imaging will be scheduled.

BI-RADS CATEGORY  0: Incomplete. Need additional imaging evaluation
and/or prior mammograms for comparison.

## 2019-01-10 ENCOUNTER — Ambulatory Visit: Payer: 59 | Admitting: Radiation Oncology

## 2019-01-18 ENCOUNTER — Ambulatory Visit: Payer: 59 | Admitting: Radiation Oncology

## 2019-01-18 ENCOUNTER — Other Ambulatory Visit: Payer: Self-pay

## 2019-01-18 ENCOUNTER — Encounter: Payer: Self-pay | Admitting: Radiation Oncology

## 2019-01-18 ENCOUNTER — Ambulatory Visit
Admission: RE | Admit: 2019-01-18 | Discharge: 2019-01-18 | Disposition: A | Discharge status: Home / Self Care | Payer: 59 | Source: Ambulatory Visit | Attending: Radiation Oncology | Admitting: Radiation Oncology

## 2019-01-18 VITALS — BP 164/60 | HR 81 | Temp 98.5°F | Resp 18 | Wt 267.8 lb

## 2019-01-18 DIAGNOSIS — C50411 Malignant neoplasm of upper-outer quadrant of right female breast: Secondary | ICD-10-CM | POA: Diagnosis present

## 2019-01-18 DIAGNOSIS — Z170 Estrogen receptor positive status [ER+]: Secondary | ICD-10-CM | POA: Insufficient documentation

## 2019-01-18 DIAGNOSIS — Z923 Personal history of irradiation: Secondary | ICD-10-CM | POA: Insufficient documentation

## 2019-01-18 DIAGNOSIS — Z79811 Long term (current) use of aromatase inhibitors: Secondary | ICD-10-CM | POA: Diagnosis not present

## 2019-01-18 NOTE — Progress Notes (Signed)
Radiation Oncology Follow up Note  Name: Tammy Turner   Date:   01/18/2019 MRN:  [id number] DOB: January 28, 1961    This 58 y.o. female presents to the clinic today for 2-year follow-up status post whole breast radiation to her right breast for stage I invasive mammary carcinoma.  REFERRING PROVIDER: Juluis Pitch, MD  HPI: Patient is a 58 year old female now at 2 years having pleated whole breast radiation to her right breast for stage I invasive mammary carcinoma ER/PR positive.  Seen today in routine follow-up she is doing well she still has some tenderness in her lumpectomy site.  Otherwise specifically denies breast tenderness cough or bone pain..  Mammograms back in June which I have reviewed were BI-RADS 2 benign.  She is currently on Aromasin tolerating that well without side effect.  COMPLICATIONS OF TREATMENT: none  FOLLOW UP COMPLIANCE: keeps appointments   PHYSICAL EXAM:  BP (!) 164/60   Pulse 81   Temp 98.5 F (36.9 C)   Resp 18   Wt 267 lb 12.8 oz (121.5 kg)   BMI 50.60 kg/m  Lungs are clear to A&P cardiac examination essentially unremarkable with regular rate and rhythm. No dominant mass or nodularity is noted in either breast in 2 positions examined. Incision is well-healed. No axillary or supraclavicular adenopathy is appreciated. Cosmetic result is excellent.  Well-developed well-nourished patient in NAD. HEENT reveals PERLA, EOMI, discs not visualized.  Oral cavity is clear. No oral mucosal lesions are identified. Neck is clear without evidence of cervical or supraclavicular adenopathy. Lungs are clear to A&P. Cardiac examination is essentially unremarkable with regular rate and rhythm without murmur rub or thrill. Abdomen is benign with no organomegaly or masses noted. Motor sensory and DTR levels are equal and symmetric in the upper and lower extremities. Cranial nerves II through XII are grossly intact. Proprioception is intact. No peripheral adenopathy or edema is  identified. No motor or sensory levels are noted. Crude visual fields are within normal range.  RADIOLOGY RESULTS: Mammograms reviewed compatible with above-stated findings  PLAN: Present time patient continues to do well 2 years out with no evidence of disease.  She continues on Aromasin without side effect.  She is going to reestablish follow-up care with Dr. Tollie Pizza.  I have asked to see her back in 1 year for follow-up.  She knows to call with any concerns at any time.  I would like to take this opportunity to thank you for allowing me to participate in the care of your patient.Noreene Filbert, MD

## 2019-04-15 ENCOUNTER — Ambulatory Visit: Payer: 59

## 2019-04-15 ENCOUNTER — Ambulatory Visit: Payer: 59 | Attending: Internal Medicine

## 2019-04-15 DIAGNOSIS — Z23 Encounter for immunization: Secondary | ICD-10-CM

## 2019-04-15 NOTE — Progress Notes (Signed)
   Covid-19 Vaccination Clinic  Name:  Sharissa Caronia    MRN: [id number] DOB: 04-20-1960  04/15/2019  Ms. Tomas was observed post Covid-19 immunization for 15 minutes without incident. She was provided with Vaccine Information Sheet and instruction to access the V-Safe system.   Ms. Townsend was instructed to call 911 with any severe reactions post vaccine: Marland Kitchen Difficulty breathing  . Swelling of face and throat  . A fast heartbeat  . A bad rash all over body  . Dizziness and weakness   Immunizations Administered    Name Date Dose VIS Date Route   Pfizer COVID-19 Vaccine 04/15/2019  8:48 AM 0.3 mL 01/18/2019 Intramuscular   Manufacturer: [place]   Lot: [id number]   [place]: [id number]

## 2019-05-07 ENCOUNTER — Ambulatory Visit: Payer: 59 | Attending: Internal Medicine

## 2019-05-07 DIAGNOSIS — Z23 Encounter for immunization: Secondary | ICD-10-CM

## 2019-05-07 NOTE — Progress Notes (Signed)
   Covid-19 Vaccination Clinic  Name:  Tammy Turner    MRN: [id number] DOB: 1960-04-14  05/07/2019  [name] was observed post Covid-19 immunization for 15 minutes without incident. She was provided with Vaccine Information Sheet and instruction to access the V-Safe system.   [name] was instructed to call 911 with any severe reactions post vaccine: Marland Kitchen Difficulty breathing  . Swelling of face and throat  . A fast heartbeat  . A bad rash all over body  . Dizziness and weakness   Immunizations Administered    Name Date Dose VIS Date Route   Pfizer COVID-19 Vaccine 05/07/2019 10:18 AM 0.3 mL 01/18/2019 Intramuscular   Manufacturer: [place]   Lot: [id number]   [place]: [id number]

## 2019-06-14 ENCOUNTER — Ambulatory Visit: Payer: 59 | Admitting: Obstetrics and Gynecology

## 2019-07-02 ENCOUNTER — Other Ambulatory Visit: Payer: Self-pay | Admitting: General Surgery

## 2019-07-02 DIAGNOSIS — C50411 Malignant neoplasm of upper-outer quadrant of right female breast: Secondary | ICD-10-CM

## 2019-07-02 DIAGNOSIS — Z170 Estrogen receptor positive status [ER+]: Secondary | ICD-10-CM

## 2019-08-13 ENCOUNTER — Other Ambulatory Visit: Payer: 59

## 2019-08-19 ENCOUNTER — Ambulatory Visit: Payer: 59 | Admitting: Obstetrics and Gynecology

## 2019-11-11 ENCOUNTER — Encounter: Payer: Self-pay | Admitting: Obstetrics and Gynecology

## 2019-11-11 ENCOUNTER — Other Ambulatory Visit (HOSPITAL_COMMUNITY)
Admission: RE | Admit: 2019-11-11 | Discharge: 2019-11-11 | Disposition: A | Discharge status: Home / Self Care | Payer: 59 | Source: Ambulatory Visit | Attending: Obstetrics and Gynecology | Admitting: Obstetrics and Gynecology

## 2019-11-11 ENCOUNTER — Other Ambulatory Visit: Payer: Self-pay

## 2019-11-11 ENCOUNTER — Ambulatory Visit (INDEPENDENT_AMBULATORY_CARE_PROVIDER_SITE_OTHER): Payer: 59 | Admitting: Obstetrics and Gynecology

## 2019-11-11 VITALS — BP 140/90 | Ht 61.0 in | Wt 281.0 lb

## 2019-11-11 DIAGNOSIS — C50411 Malignant neoplasm of upper-outer quadrant of right female breast: Secondary | ICD-10-CM | POA: Diagnosis not present

## 2019-11-11 DIAGNOSIS — N898 Other specified noninflammatory disorders of vagina: Secondary | ICD-10-CM

## 2019-11-11 DIAGNOSIS — Z1151 Encounter for screening for human papillomavirus (HPV): Secondary | ICD-10-CM | POA: Insufficient documentation

## 2019-11-11 DIAGNOSIS — Z170 Estrogen receptor positive status [ER+]: Secondary | ICD-10-CM

## 2019-11-11 DIAGNOSIS — Z01419 Encounter for gynecological examination (general) (routine) without abnormal findings: Secondary | ICD-10-CM

## 2019-11-11 DIAGNOSIS — Z124 Encounter for screening for malignant neoplasm of cervix: Secondary | ICD-10-CM | POA: Insufficient documentation

## 2019-11-11 NOTE — Patient Instructions (Addendum)
I value your feedback and entrusting [place] with your care. If you get a [facility] patient survey, I would appreciate you taking the time to let us know about your experience today. Thank you!  As of January 17, 2019, your lab results will be released to your MyChart immediately, before I even have a chance to see them. Please give me time to review them and contact you if there are any abnormalities. Thank you for your patience.    HEALTHY VAGINAL HYGIENE  AVOID   Panytyhose Synthetic underwear (wear COTTON underwear)  Tight pants/jeans Thongs Pantyliners Scented soaps/shower gels (use Dove Sensitive Skin soap or water to clean) Bubble bath/bath bombs Scented detergents  ALL dryer sheets (line dry underwear if using them on your other clothing) Feminine sprays/douches

## 2019-11-11 NOTE — Progress Notes (Signed)
PCP: Juluis Pitch, MD   Chief Complaint  Patient presents with  . Gynecologic Exam  . Vaginal Irritation    no discharge/itchiness/odor x 3 months    HPI:      [name] is a 59 y.o. [id number] who LMP was No LMP recorded. Patient has had a hysterectomy., presents today for her annual examination.  Her menses are absent and she is postmenopausal.  She does not have intermenstrual bleeding.  She does have vasomotor sx with aromasin for breast cancer.   Sex activity: single partner, contraception - post menopausal status. She does have vaginal dryness and uses coconut oil with sx relief.  Has been having vag irritation the past few months. Sx started after using cottonelle wipes that were later recalled due to bacteria (some people needed abx tx). Sx improving since stopped using them but vaginal opening and perineal area still sensitive. No increased vag d/c.  Last Pap: 02/06/17  Results were: no abnormalities /neg HPV DNA.  Hx of STDs: none  Last mammogram: 08/07/18  was normal, repeat in 12 months. Has appt 11/21. 2018 mammo was Cat 5. She had a bx with Dr. Bary Castilla and was found to have stage 1 RT breast cancer, ER/PR+. She did excision with radiation, and started femara last yr. Changed to aromasin due to bad side effects with femara. Still having them but not as bad.   There is no FH of breast cancer. There is a FH of ovarian cancer in her MGM. Pt tested MyRisk neg 2016. The patient does do self-breast exams.  Colonoscopy: colonoscopy 2017 with abnormalities.  Repeat due after 5 years. Fh colon cancer in several fam members, including her father.  DEXA: 8/[facility] at [facility] with osteoporosis in spine, osteopenia in hip. On actonel as well as ca/Vit D.   Tobacco use: The patient denies current or previous tobacco use. Alcohol use: none  No drug use Exercise: moderately active  She does get adequate calcium and Vitamin D in her diet.  Labs with PCP.  Past Medical History:    Diagnosis Date  . Allergic state   . Anemia   . Breast cancer of upper-outer quadrant of right female breast ([place]) 06/2016   ER 95%, PR 90%, HER-2/neu not overexpressed.  . Complication of anesthesia    SEVERE MUSCLE ACHES FROM DIAPHRAGM TO THIGHS X 2 DAYS AFTER PARTIAL MASTECTOMY ON 07-25-16-PT WAS GIVEN SUCCINYLCHOLINE FOR INDUCTION  . Dyspnea on exertion    a. 09/2013 Echo: EF 60-65%, no rwma;  b. 11/2013 Myoview: EF 64%, no ischemia; c. 09/2016 Echo: EF 55-60%, no rwma.  . Family history of ovarian cancer    Pt is My Risk cancer genetic testing neg 2016  . Genetic testing 2016   My Risk negative  . Gross hematuria   . Headache(784.0)   . Hyperlipidemia   . Hypertension   . Hypothyroidism   . Migraines   . Morbid obesity ([place])   . Osteopenia   . Osteopenia   . Pernicious anemia   . Personal history of radiation therapy   . Sleep apnea 2010   uses CPAP  . Syncope    a. 09/2016 - seen in ED - limited w/u unrevealing; b. 09/2016 Echo: eF 55-60%, no rwma;  b. 10/2016 Event monitor:  no signficant arrhythmias. Occas pac's.    Past Surgical History:  Procedure Laterality Date  . ABDOMINAL HYSTERECTOMY  2011   [place]; Dr. Laurey Morale, due to leio/adenomyosis  . BREAST EXCISIONAL BIOPSY  Right 07/25/2016   lumpectomy radation reexcison + anterior margins 08-08-16  . BREAST LUMPECTOMY Right 08/08/2016   Procedure: BREAST RE-EXCISION;  Surgeon: Robert Bellow, MD;  Location: [facility] ORS;  Service: General;  Laterality: Right;  . CESAREAN SECTION     x 2  . CHOLECYSTECTOMY    . COLONOSCOPY    . COLONOSCOPY WITH PROPOFOL N/A 10/05/2015   Procedure: COLONOSCOPY WITH PROPOFOL;  Surgeon: Manya Silvas, MD;  Location: [facility] ENDOSCOPY;  Service: Endoscopy;  Laterality: N/A;  . DILATION AND CURETTAGE OF UTERUS    . FUNCTIONAL ENDOSCOPIC SINUS SURGERY    . PARTIAL MASTECTOMY WITH AXILLARY SENTINEL LYMPH NODE BIOPSY Right 07/25/2016   Procedure: PARTIAL MASTECTOMY WITH AXILLARY SENTINEL LYMPH NODE  BIOPSY;  Surgeon: Robert Bellow, MD;  Location: [facility] ORS;  Service: General;  Laterality: Right;    Family History  Problem Relation Age of Onset  . Pulmonary embolism Mother   . Hypertension Mother   . Hyperlipidemia Mother   . Hypertension Father   . Colon cancer Father 44  . Ovarian cancer Maternal Grandmother        77  . Colon cancer Paternal Grandmother        90  . Colon cancer Maternal Grandfather 26  . Breast cancer Neg Hx     Social History   Socioeconomic History  . Marital status: Married    Spouse name: Not on file  . Number of children: Not on file  . Years of education: Not on file  . Highest education level: Not on file  Occupational History  . Not on file  Tobacco Use  . Smoking status: Never Smoker  . Smokeless tobacco: Never Used  Vaping Use  . Vaping Use: Never used  Substance and Sexual Activity  . Alcohol use: No  . Drug use: No  . Sexual activity: Yes    Birth control/protection: Surgical    Comment: Hysterectomy  Other Topics Concern  . Not on file  Social History Narrative  . Not on file   Social Determinants of Health   Financial Resource Strain:   . Difficulty of Paying Living Expenses: Not on file  Food Insecurity:   . Worried About Charity fundraiser in the Last Year: Not on file  . Ran Out of Food in the Last Year: Not on file  Transportation Needs:   . Lack of Transportation (Medical): Not on file  . Lack of Transportation (Non-Medical): Not on file  Physical Activity:   . Days of Exercise per Week: Not on file  . Minutes of Exercise per Session: Not on file  Stress:   . Feeling of Stress : Not on file  Social Connections:   . Frequency of Communication with Friends and Family: Not on file  . Frequency of Social Gatherings with Friends and Family: Not on file  . Attends Religious Services: Not on file  . Active Member of Clubs or Organizations: Not on file  . Attends Archivist Meetings: Not on file  .  Marital Status: Not on file  Intimate Partner Violence:   . Fear of Current or Ex-Partner: Not on file  . Emotionally Abused: Not on file  . Physically Abused: Not on file  . Sexually Abused: Not on file    Current Meds  Medication Sig  . ASCORBIC ACID PO Take 2 tablets by mouth every evening.  Marland Kitchen BORON PO Take by mouth.  Marland Kitchen CALCIUM PO Take by mouth.  Marland Kitchen  Carboxymethylcellulose Sodium (THERATEARS OP) Place 1 drop into both eyes 2 (two) times daily as needed (dry eyes).  . cholecalciferol (VITAMIN D) 1000 units tablet Take 1,000 Units by mouth every evening.  . Cyanocobalamin (VITAMIN B-12 IJ) Inject 1,000 mcg as directed every 30 (thirty) days.  Marland Kitchen EPINEPHrine 0.3 mg/0.3 mL IJ SOAJ injection INJECT INTRAMUSCULARLY AS DIRECTED  . exemestane (AROMASIN) 25 MG tablet Take 1 tablet (25 mg total) by mouth daily after breakfast.  . losartan (COZAAR) 100 MG tablet losartan 100 mg tablet  . MAGNESIUM PO Take by mouth.  . potassium chloride SA (KLOR-CON M20) 20 MEQ tablet Klor-Con M20 mEq tablet,extended release  . risedronate (ACTONEL) 35 MG tablet risedronate 35 mg tablet  . thyroid (ARMOUR THYROID) 120 MG tablet Armour Thyroid 120 mg tablet  . thyroid (ARMOUR THYROID) 30 MG tablet Armour Thyroid 30 mg tablet  . ZINC SULFATE PO Take by mouth.      ROS:  Review of Systems  Constitutional: Positive for fatigue. Negative for fever and unexpected weight change.  Respiratory: Negative for cough, shortness of breath and wheezing.   Cardiovascular: Negative for chest pain, palpitations and leg swelling.  Gastrointestinal: Negative for blood in stool, constipation, diarrhea, nausea and vomiting.  Endocrine: Negative for cold intolerance, heat intolerance and polyuria.  Genitourinary: Negative for dyspareunia, dysuria, flank pain, frequency, genital sores, hematuria, menstrual problem, pelvic pain, urgency, vaginal bleeding, vaginal discharge and vaginal pain.  Musculoskeletal: Negative for back  pain, joint swelling and myalgias.  Skin: Negative for rash.  Neurological: Negative for dizziness, syncope, light-headedness, numbness and headaches.  Hematological: Negative for adenopathy.  Psychiatric/Behavioral: Negative for agitation, confusion, dysphoric mood, sleep disturbance and suicidal ideas. The patient is not nervous/anxious.      Objective: BP 140/90   Ht 5' 1"  (1.549 m)   Wt 281 lb (127.5 kg)   BMI 53.09 kg/m    Physical Exam Constitutional:      Appearance: She is well-developed.  Genitourinary:     Vagina, cervix, uterus, right adnexa and left adnexa normal.     Vulval rash present.     No vulval lesion or tenderness noted.        No vaginal discharge, erythema or tenderness.     No cervical polyp.     Uterus is not enlarged or tender.     No right or left adnexal mass present.     Right adnexa not tender.     Left adnexa not tender.  Neck:     Thyroid: No thyromegaly.  Cardiovascular:     Rate and Rhythm: Normal rate and regular rhythm.     Heart sounds: Normal heart sounds. No murmur heard.   Pulmonary:     Effort: Pulmonary effort is normal.     Breath sounds: Normal breath sounds.  Chest:     Breasts:        Right: No mass, nipple discharge, skin change or tenderness.        Left: No mass, nipple discharge, skin change or tenderness.  Abdominal:     Palpations: Abdomen is soft.     Tenderness: There is no abdominal tenderness. There is no guarding.  Musculoskeletal:        General: Normal range of motion.     Cervical back: Normal range of motion.  Neurological:     General: No focal deficit present.     Mental Status: She is alert and oriented to person, place, and time.  Cranial Nerves: No cranial nerve deficit.  Skin:    General: Skin is warm and dry.  Psychiatric:        Mood and Affect: Mood normal.        Behavior: Behavior normal.        Thought Content: Thought content normal.        Judgment: Judgment normal.  Vitals  reviewed.     Assessment/Plan:  Encounter for annual routine gynecological examination  Cervical cancer screening - Plan: Cytology - PAP  Screening for HPV (human papillomavirus) - Plan: Cytology - PAP  Malignant neoplasm of upper-outer quadrant of right breast in female, estrogen receptor positive ([place]); followed by Dr. Bary Castilla.   Vaginal irritation--resolving areas bilat labia majora. Has d/c'd wipes, try OTC hydrocortisone crm ext prn/sitz baths. No area needing abx tx. F/u prn.         GYN counsel breast self exam, mammography screening, menopause, adequate intake of calcium and vitamin D, diet and exercise    F/U  Return in about 1 year (around 11/10/2020).  Sharlee Rufino B. Ociel Retherford, PA-C 11/11/2019 4:22 PM

## 2019-11-13 LAB — CYTOLOGY - PAP
Comment: NEGATIVE
Diagnosis: NEGATIVE
High risk HPV: NEGATIVE

## 2019-12-04 ENCOUNTER — Ambulatory Visit (INDEPENDENT_AMBULATORY_CARE_PROVIDER_SITE_OTHER): Payer: 59 | Admitting: Internal Medicine

## 2019-12-04 ENCOUNTER — Other Ambulatory Visit: Payer: Self-pay

## 2019-12-04 ENCOUNTER — Encounter: Payer: Self-pay | Admitting: Internal Medicine

## 2019-12-04 VITALS — BP 162/92 | HR 102 | Ht 61.0 in | Wt 284.0 lb

## 2019-12-04 DIAGNOSIS — R079 Chest pain, unspecified: Secondary | ICD-10-CM | POA: Diagnosis not present

## 2019-12-04 DIAGNOSIS — R0609 Other forms of dyspnea: Secondary | ICD-10-CM

## 2019-12-04 DIAGNOSIS — R002 Palpitations: Secondary | ICD-10-CM | POA: Diagnosis not present

## 2019-12-04 DIAGNOSIS — I10 Essential (primary) hypertension: Secondary | ICD-10-CM

## 2019-12-04 DIAGNOSIS — R0600 Dyspnea, unspecified: Secondary | ICD-10-CM

## 2019-12-04 NOTE — Progress Notes (Signed)
New Outpatient Visit Date: 12/04/2019  Primary Care provider: Juluis Pitch, MD [address],  [place] [address]  Chief Complaint: Fatigue and chest pain  HPI:  [name] is a 59 y.o. female who is being seen today for the evaluation of Egan chest pain. She has a history of hypertension, hyperlipidemia, hypothyroidism, pernicious anemia, obesity, obstructive sleep apnea, and breast cancer. She was previously followed in our practice by Dr. Fletcher Anon, having last been seen by Ignacia Bayley, NP, on 11/15/2016 for follow-up of palpitations and syncope. Preceding ED visit for syncope was unrevealing. No additional testing or intervention was pursued. 42-monthfollow-up was recommended.  Ms. BRandlereports that over the last 6 weeks, she has felt notably more fatigued.  She has had chronic pain in her chest that she describes as a squeezing sensation that dates back several to years when she was last seen in our office.  However, 2 days ago she also had a new chest pain that she describes as a "stinging pain" that would occur every few seconds over the course of 6 to 7 minutes.  This was accompanied by palpitations during which it felt like her heart would pause or skip a beat.  She also had a faint feeling around this time.  She has not passed out.  Ms. BBezekreports a 10 pound weight gain over the last 6 to 12 months.  She has been less active, having been particularly sedentary earlier this year after fracturing her right humerus.  She has chronic swelling of the right arm secondary to lymphedema from lymph node dissection.  She denies lower extremity edema.  She does not have significant orthopnea and uses CPAP regularly.  --------------------------------------------------------------------------------------------------  Past Medical History:  Diagnosis Date  . Allergic state   . Anemia   . Breast [place] of [place]outer [place]   ER 95%, PR 90%, HER-2/neu  not overexpressed.  . Complication of anesthesia    SEVERE MUSCLE ACHES FROM DIAPHRAGM TO THIGHS X 2 DAYS AFTER PARTIAL MASTECTOMY ON 07-25-16-PT WAS GIVEN SUCCINYLCHOLINE FOR INDUCTION  . Dyspnea on exertion    a. 09/2013 Echo: EF 60-65%, no rwma;  b. 11/2013 Myoview: EF 64%, no ischemia; c. 09/2016 Echo: EF 55-60%, no rwma.  . Family history of ovarian cancer    Pt is My Risk cancer genetic testing neg 2016  . Genetic testing 2016   My Risk negative  . Gross hematuria   . Headache(784.0)   . Hyperlipidemia   . Hypertension   . Hypothyroidism   . Migraines   . Morbid obesity ([place]   . Osteopenia   . Osteopenia   . Pernicious anemia   . Personal history of radiation therapy   . Sleep apnea 2010   uses CPAP  . Syncope    a. 09/2016 - seen in ED - limited w/u unrevealing; b. 09/2016 Echo: eF 55-60%, no rwma;  b. 10/2016 Event monitor:  no signficant arrhythmias. Occas pac's.   Past Surgical History:  Procedure Laterality Date  . [place]  [place] due to leio/adenomyosis  . BREAST EXCISIONAL BIOPSY Right 07/25/2016   lumpectomy radation reexcison + anterior margins 08-08-16  . BREAST LUMPECTOMY Right 08/08/2016   Procedure: BREAST RE-EXCISION;  Surgeon: BRobert Bellow MD;  Location: [facility] ORS;  Service: General;  Laterality: Right;  . CESAREAN SECTION     x 2  . CHOLECYSTECTOMY    . COLONOSCOPY    .  COLONOSCOPY WITH PROPOFOL N/A 10/05/2015   Procedure: COLONOSCOPY WITH PROPOFOL;  Surgeon: Manya Silvas, MD;  Location[facility]COPY;  Service: Endoscopy;  Laterality: N/A;  . DILATION AND CURETTAGE OF UTERUS    . FUNCTIONAL ENDOSCOPIC SINUS SURGERY    . PARTIAL MASTECTOMY WITH AXILLARY SENTINEL LYMPH NODE BIOPSY Right 07/25/2016   Procedure: PARTIAL MASTECTOMY WITH AXILLARY SENTINEL LYMPH NODE BIOPSY;  Surgeon: Robert Bellow, MD;  Location: [facility] ORS;  Service: General;  Laterality: Right;     --------------------------------------------------------------------------------------------------  Past Medical History:  Diagnosis Date  . Allergic state   . Anemia   . Breast cancer of upper-outer quadrant of right female breast ([place]) 06/2016   ER 95%, PR 90%, HER-2/neu not overexpressed.  . Complication of anesthesia    SEVERE MUSCLE ACHES FROM DIAPHRAGM TO THIGHS X 2 DAYS AFTER PARTIAL MASTECTOMY ON 07-25-16-PT WAS GIVEN SUCCINYLCHOLINE FOR INDUCTION  . Dyspnea on exertion    a. 09/2013 Echo: EF 60-65%, no rwma;  b. 11/2013 Myoview: EF 64%, no ischemia; c. 09/2016 Echo: EF 55-60%, no rwma.  . Family history of ovarian cancer    Pt is My Risk cancer genetic testing neg 2016  . Genetic testing 2016   My Risk negative  . Gross hematuria   . Headache(784.0)   . Hyperlipidemia   . Hypertension   . Hypothyroidism   . Migraines   . Morbid obesity ([employer])   . Osteopenia   . Osteopenia   . Pernicious anemia   . Personal history of radiation therapy   . Sleep apnea 2010   uses CPAP  . Syncope    a. 09/2016 - seen in ED - limited w/u unrevealing; b. 09/2016 Echo: eF 55-60%, no rwma;  b. 10/2016 Event monitor:  no signficant arrhythmias. Occas pac's.    Past Surgical History:  Procedure Laterality Date  . ABDOMINAL HYSTERECTOMY  2011   [place]; Dr. Laurey Morale, due to leio/adenomyosis  . BREAST EXCISIONAL BIOPSY Right 07/25/2016   lumpectomy radation reexcison + anterior margins 08-08-16  . BREAST LUMPECTOMY Right 08/08/2016   Procedure: BREAST RE-EXCISION;  Surgeon: Robert Bellow, MD;  Location: [facility] ORS;  Service: General;  Laterality: Right;  . CESAREAN SECTION     x 2  . CHOLECYSTECTOMY    . COLONOSCOPY    . COLONOSCOPY WITH PROPOFOL N/A 10/05/2015   Procedure: COLONOSCOPY WITH PROPOFOL;  Surgeon: Manya Silvas, MD;  Location: [facility] ENDOSCOPY;  Service: Endoscopy;  Laterality: N/A;  . DILATION AND CURETTAGE OF UTERUS    . FUNCTIONAL ENDOSCOPIC SINUS SURGERY    . PARTIAL  MASTECTOMY WITH AXILLARY SENTINEL LYMPH NODE BIOPSY Right 07/25/2016   Procedure: PARTIAL MASTECTOMY WITH AXILLARY SENTINEL LYMPH NODE BIOPSY;  Surgeon: Robert Bellow, MD;  Location: [facility] ORS;  Service: General;  Laterality: Right;    Current Meds  Medication Sig  . ASCORBIC ACID PO Take 2 tablets by mouth every evening.  Marland Kitchen BORON PO Take by mouth.  Marland Kitchen CALCIUM PO Take by mouth.  . Carboxymethylcellulose Sodium (THERATEARS OP) Place 1 drop into both eyes 2 (two) times daily as needed (dry eyes).  . cholecalciferol (VITAMIN D) 1000 units tablet Take 1,000 Units by mouth every evening.  . Cyanocobalamin (VITAMIN B-12 IJ) Inject 1,000 mcg as directed every 30 (thirty) days.  Marland Kitchen EPINEPHrine 0.3 mg/0.3 mL IJ SOAJ injection INJECT INTRAMUSCULARLY AS DIRECTED  . exemestane (AROMASIN) 25 MG tablet Take 1 tablet (25 mg total) by mouth daily after breakfast.  . losartan (COZAAR)  100 MG tablet losartan 100 mg tablet  . MAGNESIUM PO Take by mouth.  . potassium chloride SA (KLOR-CON M20) 20 MEQ tablet Klor-Con M20 mEq tablet,extended release  . thyroid (ARMOUR THYROID) 120 MG tablet Armour Thyroid 120 mg tablet  . thyroid (ARMOUR THYROID) 30 MG tablet Armour Thyroid 30 mg tablet  . trimethoprim-polymyxin b (POLYTRIM) ophthalmic solution polymyxin B sulfate 10,000 unit-trimethoprim 1 mg/mL eye drops  . ZINC SULFATE PO Take by mouth.  . [DISCONTINUED] risedronate (ACTONEL) 35 MG tablet risedronate 35 mg tablet    Allergies: Morphine, Niacin, Septra [sulfamethoxazole-trimethoprim], Bisphosphonates, Doxycycline, Iodine, Ivp dye [iodinated diagnostic agents], Levaquin [levofloxacin in d5w], Cephalexin, Penicillins, and Sulfa antibiotics  Social History   Tobacco Use  . Smoking status: Never Smoker  . Smokeless tobacco: Never Used  Vaping Use  . Vaping Use: Never used  Substance Use Topics  . Alcohol use: No  . Drug use: No    Family History  Problem Relation Age of Onset  . Pulmonary embolism  Mother   . Hypertension Mother   . Hyperlipidemia Mother   . Hypertension Father   . Colon cancer Father 39  . Ovarian cancer Maternal Grandmother        72  . Colon cancer Paternal Grandmother        42  . Colon cancer Maternal Grandfather 5  . Fainting Paternal Grandfather   . Breast cancer Neg Hx     Review of Systems: A 12-system review of systems was performed and was negative except as noted in the HPI.  --------------------------------------------------------------------------------------------------  Physical Exam: BP (!) 162/92 (BP Location: Left Arm, Patient Position: Sitting, Cuff Size: Large)   Pulse (!) 102   Ht 5' 1"  (1.549 m)   Wt 284 lb (128.8 kg)   SpO2 97%   BMI 53.66 kg/m   General: NAD. HEENT: No conjunctival pallor or scleral icterus. Facemask in place. Neck: Supple without lymphadenopathy, thyromegaly, JVD, or HJR, though body habitus limits evaluation.. No carotid bruit. Lungs: Normal work of breathing. Clear to auscultation bilaterally without wheezes or crackles. Heart: Distant heart sounds.  Tachycardic but regular without murmurs, rubs, or gallops.  Unable to assess PMI due to body habitus. Abd: Bowel sounds present. Soft, NT/ND.  Unable to assess HSM due to body habitus. Ext: No lower extremity edema. Radial, PT, and DP pulses are 2+ bilaterally Skin: Warm and dry without rash. Neuro: CNIII-XII intact. Strength and fine-touch sensation intact in upper and lower extremities bilaterally. Psych: Normal mood and affect.  EKG: Sinus tachycardia (heart rate 102 bpm) with incomplete RBBB.  No significant change from prior tracing on 11/15/2016.  Lab Results  Component Value Date   WBC 3.8 11/25/2016   HGB 13.9 11/25/2016   HCT 39.7 11/25/2016   MCV 86.7 11/25/2016   PLT 198 11/25/2016    Lab Results  Component Value Date   NA 137 11/25/2016   K 4.1 11/25/2016   CL 105 11/25/2016   CO2 24 11/25/2016   BUN 19 11/25/2016   CREATININE 0.82  11/25/2016   GLUCOSE 108 (H) 11/25/2016   ALT 30 11/25/2016    --------------------------------------------------------------------------------------------------  ASSESSMENT AND PLAN: Chest pain, dyspnea on exertion, and palpitations: Ms. Rupert describes two distinct types of chest pain.  She has recently experienced an intermittent stinging sensation in her chest accompanied by palpitations, which is most consistent with an arrhythmia such as frequent PACs or PVCs.  She also has chronic pressure/squeezing in her chest that has been present for  several years and seems stable.  I have suggested that we obtain a 14-day event monitor for further evaluation as well as a transthoracic echocardiogram, given her progressive exertional dyspnea.  If this is unrevealing, we will need to consider noninvasive ischemia testing.  I would favor myocardial PET/CT at [facility], if Ms. Beason is amenable to traveling to [facility], given BMI greater than 50.  Will defer medication changes at this time.  Hypertension: Blood pressure moderately elevated on exam today.  We have agreed to defer medication changes at this time but will need to readdress adding an additional agent to losartan if her blood pressure remains elevated at follow-up.  Morbid obesity: BMI greater than 50.  I have encouraged weight loss through diet and exercise, though this has been challenging for Ms. Washburn secondary to her underlying thyroid disease and hormone therapy for history of breast cancer.  Follow-up: Return to clinic in 6 weeks.  Nelva Bush, MD 12/04/2019 4:02 PM

## 2019-12-04 NOTE — Patient Instructions (Signed)
Medication Instructions:  Your physician recommends that you continue on your current medications as directed. Please refer to the Current Medication list given to you today.  *If you need a refill on your cardiac medications before your next appointment, please call your pharmacy*  Lab Work: none If you have labs (blood work) drawn today and your tests are completely normal, you will receive your results only by: Marland Kitchen MyChart Message (if you have MyChart) OR . A paper copy in the mail If you have any lab test that is abnormal or we need to change your treatment, we will call you to review the results.  Testing/Procedures: Your physician has requested that you have an echocardiogram. Echocardiography is a painless test that uses sound waves to create images of your heart. It provides your doctor with information about the size and shape of your heart and how well your heart's chambers and valves are working. This procedure takes approximately one hour. There are no restrictions for this procedure. You may get an IV, if needed, to receive an ultrasound enhancing agent through to better visualize your heart.    ZIO MONITOR FOR 14 DAYS - APPLY AFTER THE MAMMOGRAM This will be directly shipped to you from [employer]- they will call you just prior to receipt of the monitor or when you actually receive the monitor.  If you get a call from an 800# or a 224 area code in the next 2-5 business days, then please answer the call.   Your physician has recommended that you wear a Zio monitor. This monitor is a medical device that records the heart's electrical activity. Doctors most often use these monitors to diagnose arrhythmias. Arrhythmias are problems with the speed or rhythm of the heartbeat. The monitor is a small device applied to your chest. You can wear one while you do your normal daily activities. While wearing this monitor if you have any symptoms to push the button and record what you felt. Once you  have worn this monitor for the period of time provider prescribed (Usually 14 days), you will return the monitor device in the postage paid box. Once it is returned they will download the data collected and provide [place] with a report which the provider will then review and we will call you with those results. Important tips:  1. Avoid showering during the first 24 hours of wearing the monitor. 2. Avoid excessive sweating to help maximize wear time. 3. Do not submerge the device, no hot tubs, and no swimming pools. 4. Keep any lotions or oils away from the patch. 5. After 24 hours you may shower with the patch on. Take brief showers with your back facing the shower head.  6. Do not remove patch once it has been placed because that will interrupt data and decrease adhesive wear time. 7. Push the button when you have any symptoms and write down what you were feeling. 8. Once you have completed wearing your monitor, remove and place into box which has postage paid and place in your outgoing mailbox.  9. If for some reason you have misplaced your box then call our office and we can provide another box and/or mail it off for you.   Follow-Up: At [facility], you and your health needs are our priority.  As part of our continuing mission to provide you with exceptional heart care, we have created designated Provider Care Teams.  These Care Teams include your primary Cardiologist (physician) and Advanced Practice Providers (APPs -  Physician Assistants and Nurse Practitioners) who all work together to provide you with the care you need, when you need it.  We recommend signing up for the patient portal called "MyChart".  Sign up information is provided on this After Visit Summary.  MyChart is used to connect with patients for Virtual Visits (Telemedicine).  Patients are able to view lab/test results, encounter notes, upcoming appointments, etc.  Non-urgent messages can be sent to your provider as well.   To  learn more about what you can do with MyChart, go to [email or web address].    Your next appointment:   6 week(s)  The format for your next appointment:   In Person  Provider:   You may see DR Harrell Gave END or one of the following Advanced Practice Providers on your designated Care Team:    Murray Hodgkins, NP  Christell Faith, PA-C  Marrianne Mood, PA-C  Cadence Kathlen Mody, [place]    Echocardiogram An echocardiogram is a procedure that uses painless sound waves (ultrasound) to produce an image of the heart. Images from an echocardiogram can provide important information about:  Signs of coronary artery disease (CAD).  Aneurysm detection. An aneurysm is a weak or damaged part of an artery wall that bulges out from the normal force of blood pumping through the body.  Heart size and shape. Changes in the size or shape of the heart can be associated with certain conditions, including heart failure, aneurysm, and CAD.  Heart muscle function.  Heart valve function.  Signs of a past heart attack.  Fluid buildup around the heart.  Thickening of the heart muscle.  A tumor or infectious growth around the heart valves. Tell a health care provider about:  Any allergies you have.  All medicines you are taking, including vitamins, herbs, eye drops, creams, and over-the-counter medicines.  Any blood disorders you have.  Any surgeries you have had.  Any medical conditions you have.  Whether you are pregnant or may be pregnant. What are the risks? Generally, this is a safe procedure. However, problems may occur, including:  Allergic reaction to dye (contrast) that may be used during the procedure. What happens before the procedure? No specific preparation is needed. You may eat and drink normally. What happens during the procedure?   An IV tube may be inserted into one of your veins.  You may receive contrast through this tube. A contrast is an injection that improves the  quality of the pictures from your heart.  A gel will be applied to your chest.  A wand-like tool (transducer) will be moved over your chest. The gel will help to transmit the sound waves from the transducer.  The sound waves will harmlessly bounce off of your heart to allow the heart images to be captured in real-time motion. The images will be recorded on a computer. The procedure may vary among health care providers and hospitals. What happens after the procedure?  You may return to your normal, everyday life, including diet, activities, and medicines, unless your health care provider tells you not to do that. Summary  An echocardiogram is a procedure that uses painless sound waves (ultrasound) to produce an image of the heart.  Images from an echocardiogram can provide important information about the size and shape of your heart, heart muscle function, heart valve function, and fluid buildup around your heart.  You do not need to do anything to prepare before this procedure. You may eat and drink normally.  After the echocardiogram is  completed, you may return to your normal, everyday life, unless your health care provider tells you not to do that. This information is not intended to replace advice given to you by your health care provider. Make sure you discuss any questions you have with your health care provider. Document Revised: 05/17/2018 Document Reviewed: 02/27/2016 Elsevier Patient Education  [place].

## 2019-12-05 ENCOUNTER — Encounter: Payer: Self-pay | Admitting: Internal Medicine

## 2019-12-05 DIAGNOSIS — R002 Palpitations: Secondary | ICD-10-CM | POA: Insufficient documentation

## 2019-12-05 DIAGNOSIS — R079 Chest pain, unspecified: Secondary | ICD-10-CM | POA: Insufficient documentation

## 2019-12-11 ENCOUNTER — Other Ambulatory Visit: Payer: Self-pay

## 2019-12-11 ENCOUNTER — Ambulatory Visit
Admission: RE | Admit: 2019-12-11 | Discharge: 2019-12-11 | Disposition: A | Discharge status: Home / Self Care | Payer: 59 | Source: Ambulatory Visit | Attending: General Surgery | Admitting: General Surgery

## 2019-12-11 DIAGNOSIS — C50411 Malignant neoplasm of upper-outer quadrant of right female breast: Secondary | ICD-10-CM | POA: Diagnosis present

## 2019-12-11 DIAGNOSIS — Z170 Estrogen receptor positive status [ER+]: Secondary | ICD-10-CM | POA: Diagnosis present

## 2019-12-12 ENCOUNTER — Ambulatory Visit (INDEPENDENT_AMBULATORY_CARE_PROVIDER_SITE_OTHER): Payer: 59

## 2019-12-12 DIAGNOSIS — R002 Palpitations: Secondary | ICD-10-CM

## 2019-12-12 DIAGNOSIS — R079 Chest pain, unspecified: Secondary | ICD-10-CM

## 2019-12-31 ENCOUNTER — Other Ambulatory Visit: Payer: Self-pay

## 2019-12-31 ENCOUNTER — Ambulatory Visit (INDEPENDENT_AMBULATORY_CARE_PROVIDER_SITE_OTHER): Payer: 59

## 2019-12-31 DIAGNOSIS — R079 Chest pain, unspecified: Secondary | ICD-10-CM

## 2019-12-31 LAB — ECHOCARDIOGRAM COMPLETE
Area-P 1/2: 3.5 cm2
S' Lateral: 1.9 cm

## 2020-01-06 ENCOUNTER — Telehealth: Payer: Self-pay | Admitting: *Deleted

## 2020-01-06 NOTE — Telephone Encounter (Signed)
-----   Message from Nelva Bush, MD sent at 12/31/2019  8:04 PM EST ----- Please let Ms. Magouirk know that her echocardiogram shows that her heart is contracting well.  There is some thickening of her left ventricle, which may be related to her history of hypertension.  I recommend that we obtain a pharmacologic myocardial PET/CT at [facility] [place] [facility] for further evaluation of Ms. Afzal shortness of breath, if she is agreeable to travel to [facility].

## 2020-01-06 NOTE — Telephone Encounter (Signed)
No answer. Left message to call back.   

## 2020-01-09 NOTE — Telephone Encounter (Signed)
Patient verbalized understanding of the echo results. She would like to defer further testing at this time. She would like to work on losing weight to see if her shortness of breath improves. She plans to start and would like to re-evaluate a few months into the new year. She will keep her appointment as scheduled on 01/29/20 with provider and discuss at that time as well.

## 2020-01-24 ENCOUNTER — Ambulatory Visit: Payer: 59 | Admitting: Radiation Oncology

## 2020-01-28 ENCOUNTER — Ambulatory Visit: Payer: 59 | Admitting: Nurse Practitioner

## 2020-01-29 ENCOUNTER — Ambulatory Visit (INDEPENDENT_AMBULATORY_CARE_PROVIDER_SITE_OTHER): Payer: 59 | Admitting: Nurse Practitioner

## 2020-01-29 ENCOUNTER — Encounter: Payer: Self-pay | Admitting: Nurse Practitioner

## 2020-01-29 ENCOUNTER — Other Ambulatory Visit: Payer: Self-pay

## 2020-01-29 VITALS — BP 130/86 | HR 84 | Ht 61.0 in | Wt 278.0 lb

## 2020-01-29 DIAGNOSIS — R079 Chest pain, unspecified: Secondary | ICD-10-CM | POA: Diagnosis not present

## 2020-01-29 DIAGNOSIS — R0600 Dyspnea, unspecified: Secondary | ICD-10-CM | POA: Diagnosis not present

## 2020-01-29 DIAGNOSIS — R002 Palpitations: Secondary | ICD-10-CM

## 2020-01-29 DIAGNOSIS — R0609 Other forms of dyspnea: Secondary | ICD-10-CM

## 2020-01-29 DIAGNOSIS — I10 Essential (primary) hypertension: Secondary | ICD-10-CM

## 2020-01-29 NOTE — Patient Instructions (Signed)
Medication Instructions:  No changes at this time.   *If you need a refill on your cardiac medications before your next appointment, please call your pharmacy*   Lab Work: None  If you have labs (blood work) drawn today and your tests are completely normal, you will receive your results only by: MyChart Message (if you have MyChart) OR A paper copy in the mail If you have any lab test that is abnormal or we need to change your treatment, we will call you to review the results.   Testing/Procedures: None   Follow-Up: At [facility], you and your health needs are our priority.  As part of our continuing mission to provide you with exceptional heart care, we have created designated Provider Care Teams.  These Care Teams include your primary Cardiologist (physician) and Advanced Practice Providers (APPs -  Physician Assistants and Nurse Practitioners) who all work together to provide you with the care you need, when you need it.   Your next appointment:   3 month(s)  The format for your next appointment:   In Person  Provider:   Christopher End, MD or Christopher Berge, NP  

## 2020-01-29 NOTE — Progress Notes (Signed)
Office Visit    Patient Name: Tammy Turner Date of Encounter: 01/29/2020  Primary Care Provider:  Juluis Pitch, MD Primary Cardiologist:  Nelva Bush, MD  Chief Complaint    59 year old female with history of hypertension, hyperlipidemia, hypothyroidism, pernicious anemia, obesity, sleep apnea, right breast cancer, syncope, and palpitations, who presents for follow-up related to atypical chest pain and dyspnea on exertion.  Past Medical History    Past Medical History:  Diagnosis Date  . Allergic state   . Anemia   . Atypical chest pain    a. 0/2015 Myoview: EF 64%, no ischemia.  . Breast cancer of upper-outer quadrant of right female breast ([place]) 06/2016   ER 95%, PR 90%, HER-2/neu not overexpressed.  . Complication of anesthesia    SEVERE MUSCLE ACHES FROM DIAPHRAGM TO THIGHS X 2 DAYS AFTER PARTIAL MASTECTOMY ON 07-25-16-PT WAS GIVEN SUCCINYLCHOLINE FOR INDUCTION  . Dyspnea on exertion    a. 09/2013 Echo: EF 60-65%, no rwma;  b. 11/2013 Myoview: EF 64%, no ischemia; c. 09/2016 Echo: EF 55-60%, no rwma; d. 12/2019 Echo: EF 60-65%, mod asymm LVH, nl RV fxn, mildly dil LA.   . Family history of ovarian cancer    Pt is My Risk cancer genetic testing neg 2016  . Genetic testing 2016   My Risk negative  . Gross hematuria   . Headache(784.0)   . Hyperlipidemia   . Hypertension   . Hypothyroidism   . Migraines   . Morbid obesity ([place])   . Osteopenia   . Osteopenia   . Palpitations    a. 01/2020 Zio: Avg HR 83 (47-129), rare PACs/PVCs. Six atrial runs up to 18 beats (179bpm). NSVT x 2 (up to 5 beats - 200bpm). No prolonged arrhythmias or pauses. Triggered events = sinus rhythm, w/ some PACs/PVCs.  . Pernicious anemia   . Personal history of radiation therapy   . Sleep apnea 2010   uses CPAP  . Syncope    a. 09/2016 - seen in ED - limited w/u unrevealing; b. 09/2016 Echo: EF 55-60%, no rwma;  b. 10/2016 Event monitor:  no signficant arrhythmias. Occas pac's.   Past  Surgical History:  Procedure Laterality Date  . ABDOMINAL HYSTERECTOMY  2011   [place]; Dr. Laurey Morale, due to leio/adenomyosis  . BREAST EXCISIONAL BIOPSY Right 07/25/2016   lumpectomy radation reexcison + anterior margins 08-08-16  . BREAST LUMPECTOMY Right 08/08/2016   Procedure: BREAST RE-EXCISION;  Surgeon: Robert Bellow, MD;  Location: [facility] ORS;  Service: General;  Laterality: Right;  . CESAREAN SECTION     x 2  . CHOLECYSTECTOMY    . COLONOSCOPY    . COLONOSCOPY WITH PROPOFOL N/A 10/05/2015   Procedure: COLONOSCOPY WITH PROPOFOL;  Surgeon: Manya Silvas, MD;  Location: [facility] ENDOSCOPY;  Service: Endoscopy;  Laterality: N/A;  . DILATION AND CURETTAGE OF UTERUS    . FUNCTIONAL ENDOSCOPIC SINUS SURGERY    . PARTIAL MASTECTOMY WITH AXILLARY SENTINEL LYMPH NODE BIOPSY Right 07/25/2016   Procedure: PARTIAL MASTECTOMY WITH AXILLARY SENTINEL LYMPH NODE BIOPSY;  Surgeon: Robert Bellow, MD;  Location: [facility] ORS;  Service: General;  Laterality: Right;    Allergies  Allergies  Allergen Reactions  . Morphine Other (See Comments)    Depressed respirations  . Niacin Anaphylaxis  . Septra [Sulfamethoxazole-Trimethoprim] Other (See Comments)    Fever and joint pain  . Bisphosphonates     Jaw pain  . Doxycycline Hives  . Iodine Hives and Itching  . Ivp  Dye [Iodinated Diagnostic Agents] Hives and Itching  . Levaquin [Levofloxacin In D5w] Other (See Comments)    Tendonitis   . Cephalexin Rash  . Penicillins Hives and Rash    Has patient had a PCN reaction causing immediate rash, facial/tongue/throat swelling, SOB or lightheadedness with hypotension: No Has patient had a PCN reaction causing severe rash involving mucus membranes or skin necrosis: No Has patient had a PCN reaction that required hospitalization: No Has patient had a PCN reaction occurring within the last 10 years: Yes If all of the above answers are "NO", then may proceed with Cephalosporin use.   . Sulfa Antibiotics Rash,  Hives and Itching    History of Present Illness    59 year old female with the above past medical history including hypertension, hyperlipidemia, hypothyroidism, pernicious anemia, obesity, and sleep apnea.  She was diagnosed with right-sided breast cancer in 2018 and underwent partial mastectomy with axillary sentinel lymph node biopsy followed by radiation therapy.  She suffered a syncopal spell in August 2018 which was followed by echocardiography showing normal LV function and event monitoring that did not show any significant arrhythmias.  She had previously undergone evaluation for atypical chest pain in 2015, which was nonischemic.  She recently reestablish care with Dr. Saunders Revel on October 27 at which time she reported a 6-week history of worsening fatigue in the setting of somewhat chronic chest pain (squeezing sensation), as well as a more stinging quality pain that occurred 2 days prior to clinic visit.  The latter type of discomfort was associated with palpitations described as a pause or skip.  Echocardiography was performed again showing normal LV function with an EF of 60-65% and moderate asymmetric LVH.  No significant valvular abnormalities were noted.  Zio monitoring was undertaken and showed predominantly sinus rhythm with rare PACs and PVCs.  There were 6 brief runs of SVT (longest 18 beats, fastest 179 bpm), and 2 runs of nonsustained VT (longest 5 beats, fastest 200 bpm).  Triggered events were predominantly so she was sinus rhythm though some were associated with PACs or PVCs.  In the absence of significant findings and complaints of dyspnea and chest pain in the setting of BMI greater than 50, recommendation was made for pharmacologic myocardial PET/CT.  She initially deferred but is now scheduled for PET/CT @ [facility] on 12/28.  Since her last visit, she notes that she has been doing relatively well.  She does have questions about echo and monitoring results and those have been answered in  detail today.  She notes significant reassurance by the findings.  She has not had any recurrence of stinging chest pain and also has not noticed any significant palpitations.  She has a plan for increasing activity and losing weight.  She denies dyspnea, PND, orthopnea, dizziness, syncope, edema, or early satiety.  Home Medications    Prior to Admission medications   Medication Sig Start Date End Date Taking? Authorizing Provider  ASCORBIC ACID PO Take 2 tablets by mouth every evening.    [provider]  BORON PO Take by mouth.    [provider]  CALCIUM PO Take by mouth.    [provider]  Carboxymethylcellulose Sodium (THERATEARS OP) Place 1 drop into both eyes 2 (two) times daily as needed (dry eyes).    [provider]  cholecalciferol (VITAMIN D) 1000 units tablet Take 1,000 Units by mouth every evening.    [provider]  Cyanocobalamin (VITAMIN B-12 IJ) Inject 1,000 mcg as directed  every 30 (thirty) days.    [provider]  EPINEPHrine 0.3 mg/0.3 mL IJ SOAJ injection INJECT INTRAMUSCULARLY AS DIRECTED 08/08/16   [provider]  exemestane (AROMASIN) 25 MG tablet Take 1 tablet (25 mg total) by mouth daily after breakfast. 05/17/17   Byrnett, Forest Gleason, MD  losartan (COZAAR) 100 MG tablet losartan 100 mg tablet 01/23/18   [provider]  MAGNESIUM PO Take by mouth.    [provider]  potassium chloride SA (KLOR-CON M20) 20 MEQ tablet Klor-Con M20 mEq tablet,extended release    [provider]  thyroid (ARMOUR THYROID) 120 MG tablet Armour Thyroid 120 mg tablet    [provider]  thyroid (ARMOUR THYROID) 30 MG tablet Armour Thyroid 30 mg tablet 07/04/19   [provider]  trimethoprim-polymyxin b (POLYTRIM) ophthalmic solution polymyxin B sulfate 10,000 unit-trimethoprim 1 mg/mL eye drops    [provider]  ZINC SULFATE PO Take by mouth.    [provider]     Review of Systems    Doing well since last visit.  She has chronic, stable dyspnea exertion.  She denies chest pain, palpitations, PND, orthopnea, dizziness, syncope, edema, or early satiety.  All other systems reviewed and are otherwise negative except as noted above.  Physical Exam    VS:  BP 130/86 (BP Location: Left Arm, Patient Position: Sitting, Cuff Size: Large)   Pulse 84   Ht 5' 1"  (1.549 m)   Wt 278 lb (126.1 kg)   SpO2 97%   BMI 52.53 kg/m  , BMI Body mass index is 52.53 kg/m. GEN: Obese, in no acute distress. HEENT: normal. Neck: Supple, obese, difficult to gauge JVP.  No carotid bruits, or masses. Cardiac: RRR, no murmurs, rubs, or gallops. No clubbing, cyanosis, edema.  Radials/PT 2+ and equal bilaterally.  Respiratory:  Respirations regular and unlabored, clear to auscultation bilaterally. GI: Obese, soft, nontender, nondistended, BS + x 4. MS: no deformity or atrophy. Skin: warm and dry, no rash. Neuro:  Strength and sensation are intact. Psych: Normal affect.  Accessory Clinical Findings    Labs dated September 23, 2019 (Care everywhere):  Sodium 140, potassium 4.7, chloride 102, CO2 31.3, BUN 15, creatinine 0.6, glucose 95 Calcium 9.8 Total bilirubin 0.9, alkaline phosphatase 114, AST 13, ALT 17 Total protein 0.9, albumin 4.6 TSH 2.805  Labs dated May 20, 2019 (Care Everywhere):  Hemoglobin 14.7, hematocrit 43.5, WBC 8.4, platelets 278 Total cholesterol 179, triglycerides 211, HDL 42.5, LDL 94 _____________   Assessment & Plan    1.  Chest pain and dyspnea on exertion: Patient recently evaluated secondary to complaints of somewhat chronic chest discomfort and episodes of more stinging discomfort prior to visit in October.  She also noted chronic dyspnea on exertion.  Echocardiogram showed an EF of 60 to 65% with moderate asymmetric LVH, normal RV function, and mildly dilated left atrium.  Recommendation has been made for pharmacologic myocardial  PET/CT to rule out ischemia and this is scheduled for next Tuesday.  She has not been having any significant chest pain since her last visit.  Dyspnea on exertion has been stable.  Further recommendations pending PET/CT.  We did speak today at length about the importance of blood pressure management, and lifestyle modifications with a focus on weight loss.  She is planning to start a regular exercise regimen following PET/CT.  2.  Palpitations: Recent Zio monitor without any significant arrhythmias.  She did have 6 brief runs of SVT and 2  brief runs of nonsustained VT.  Triggered events were predominantly associated with sinus rhythm though some were associated with PACs or PVCs.  Discussed results at length today.  Reassurance offered.  We did discuss the potential utilization of beta-blocker therapy if palpitation symptoms progress.  She is happy to avoid additional medication at this time.  3.  Essential hypertension: She has been checking blood pressure at home and notes that she typically runs in the low 100s to 1 teens.  She is 130/86 today.  She remains on losartan therapy.  She has been following her sodium very closely at home and trying to limit it as best she can.  She will continue to follow blood pressure at home and let us know if she is trending greater than 120.  As noted above, she is also going to work on additional lifestyle modification and weight loss.  4.  Lipids: Total cholesterol 179, with an HDL of 42.5, LDL of 94, and triglycerides of 211 in April of this year.  10-year risk of cardiac event calculates to 9.3%.  Await myocardial perfusion PET/CT.  We discussed that if exam is abnormal or significant coronary calcium noted, will have a low threshold to add statin therapy.  She prefers to avoid and will focus on lifestyle modifications at this time.  5.  Morbid obesity: BMI greater than 50.  Provided PET/CT normal, she plans to engage in more regular activity with goal of weight  loss.  6.  Disposition: Follow-up pharmacologic myocardial PET/CT next week.  Follow-up in clinic in 3 months or sooner if necessary.   Murray Hodgkins, NP 01/29/2020, 9:42 AM

## 2020-02-10 ENCOUNTER — Other Ambulatory Visit: Payer: Self-pay

## 2020-02-10 ENCOUNTER — Ambulatory Visit
Admission: RE | Admit: 2020-02-10 | Discharge: 2020-02-10 | Disposition: A | Discharge status: Home / Self Care | Payer: 59 | Source: Ambulatory Visit | Attending: Radiation Oncology | Admitting: Radiation Oncology

## 2020-02-10 ENCOUNTER — Encounter: Payer: Self-pay | Admitting: Radiation Oncology

## 2020-02-10 DIAGNOSIS — Z170 Estrogen receptor positive status [ER+]: Secondary | ICD-10-CM | POA: Diagnosis not present

## 2020-02-10 DIAGNOSIS — Z79811 Long term (current) use of aromatase inhibitors: Secondary | ICD-10-CM | POA: Insufficient documentation

## 2020-02-10 DIAGNOSIS — Z923 Personal history of irradiation: Secondary | ICD-10-CM | POA: Insufficient documentation

## 2020-02-10 DIAGNOSIS — C50411 Malignant neoplasm of upper-outer quadrant of right female breast: Secondary | ICD-10-CM

## 2020-02-10 NOTE — Progress Notes (Signed)
Radiation Oncology Follow up Note  Name: Tammy Turner   Date:   02/10/2020 MRN:  [id number] DOB: 1960/07/23    This 60 y.o. female presents to the clinic today for 3-year follow-up status post whole breast radiation to her right breast for stage I invasive mammary carcinoma ER/PR positive.  REFERRING PROVIDER: Dorothey Baseman, MD  HPI: Patient is a 60 year old female now out 3 years having completed whole breast radiation to her right breast for stage I ER/PR positive invasive mammary carcinoma seen today in routine follow-up she is doing well she states her husband felt a little lump on her back on the right side cannot discern anything in that area at all.  She also felt the small lump in her right breast had mammograms.  An ultrasound back in November showing no evidence of malignancy.  There were posttreatment changes and benign fat necrosis corresponding to the site of the patient's right palpable lump.  She is currently on Aromasin tolerating it well without side effect.  COMPLICATIONS OF TREATMENT: none  FOLLOW UP COMPLIANCE: keeps appointments   PHYSICAL EXAM:  BP (!) (P) 151/84 (BP Location: Left Arm, Patient Position: Sitting)   Temp (P) 98 F (36.7 C) (Tympanic)   Resp (P) 18   Wt (P) 280 lb 4.8 oz (127.1 kg)   BMI (P) 52.96 kg/m  Lungs are clear to A&P cardiac examination essentially unremarkable with regular rate and rhythm. No dominant mass or nodularity is noted in either breast in 2 positions examined. Incision is well-healed. No axillary or supraclavicular adenopathy is appreciated. Cosmetic result is excellent.  Well-developed well-nourished patient in NAD. HEENT reveals PERLA, EOMI, discs not visualized.  Oral cavity is clear. No oral mucosal lesions are identified. Neck is clear without evidence of cervical or supraclavicular adenopathy. Lungs are clear to A&P. Cardiac examination is essentially unremarkable with regular rate and rhythm without murmur rub or thrill.  Abdomen is benign with no organomegaly or masses noted. Motor sensory and DTR levels are equal and symmetric in the upper and lower extremities. Cranial nerves II through XII are grossly intact. Proprioception is intact. No peripheral adenopathy or edema is identified. No motor or sensory levels are noted. Crude visual fields are within normal range.  RADIOLOGY RESULTS: Mammogram and ultrasound reviewed compatible with above-stated findings  PLAN: Present time patient is now out over 3 years doing well with no evidence of disease.  I have asked to see her back in 1 year and then will discontinue follow-up care.  Patient knows to call with any concerns at any time.  She continues on Aromasin without side effect.  I would like to take this opportunity to thank you for allowing me to participate in the care of your patient.Carmina Miller, MD

## 2020-02-18 ENCOUNTER — Telehealth: Payer: Self-pay | Admitting: *Deleted

## 2020-02-18 NOTE — Telephone Encounter (Signed)
Received letter via fax from [facility]. "We recently received a request for an evaluation for [facility]. After several attempts to contact her, we have been unable to connect with the patient to schedule an appointment.   If there is a continuing medical need for her to be evaluation, please have your office first confirm her agreement with this plan, then instruct patient to contact our office. This referral order will be left open for 6 months."  Patient's next appointment is 05/14/19 with Dr End.

## 2020-04-15 ENCOUNTER — Other Ambulatory Visit: Payer: Self-pay | Admitting: General Surgery

## 2020-04-15 DIAGNOSIS — Z170 Estrogen receptor positive status [ER+]: Secondary | ICD-10-CM

## 2020-04-15 DIAGNOSIS — C50411 Malignant neoplasm of upper-outer quadrant of right female breast: Secondary | ICD-10-CM

## 2020-04-30 ENCOUNTER — Ambulatory Visit
Admission: RE | Admit: 2020-04-30 | Discharge: 2020-04-30 | Disposition: A | Discharge status: Home / Self Care | Payer: 59 | Source: Ambulatory Visit | Attending: General Surgery | Admitting: General Surgery

## 2020-04-30 ENCOUNTER — Ambulatory Visit: Payer: 59

## 2020-04-30 ENCOUNTER — Other Ambulatory Visit: Payer: Self-pay

## 2020-04-30 DIAGNOSIS — C50411 Malignant neoplasm of upper-outer quadrant of right female breast: Secondary | ICD-10-CM | POA: Diagnosis not present

## 2020-04-30 DIAGNOSIS — Z170 Estrogen receptor positive status [ER+]: Secondary | ICD-10-CM | POA: Diagnosis present

## 2020-04-30 LAB — GLUCOSE, CAPILLARY: Glucose-Capillary: 84 mg/dL (ref 70–99)

## 2020-04-30 MED ORDER — FLUDEOXYGLUCOSE F - 18 (FDG) INJECTION
14.5000 | Freq: Once | INTRAVENOUS | Status: AC | PRN
  Administered 2020-04-30: 13.39 via INTRAVENOUS

## 2020-05-13 ENCOUNTER — Ambulatory Visit: Payer: 59 | Admitting: Internal Medicine

## 2020-05-21 ENCOUNTER — Other Ambulatory Visit: Payer: Self-pay | Admitting: General Surgery

## 2020-05-21 DIAGNOSIS — R911 Solitary pulmonary nodule: Secondary | ICD-10-CM

## 2020-05-23 ENCOUNTER — Other Ambulatory Visit: Payer: Self-pay | Admitting: General Surgery

## 2020-05-23 NOTE — Progress Notes (Signed)
Subjective:     Patient ID: Tammy Turner is a 60 y.o. female.  HPI  The following portions of the patient's history were reviewed and updated as appropriate.  This an established patient is here today for: office visit. The patient is here today to follow up from a recent PET scan done at [facility] and a thoracic MRI done at [facility] in March 2022 suggesting metastatic disease to the spine.  PET/CT completed last month showed no evidence of bony metastases and likely unrelated small subcentimeter pulmonary nodules in the left lower lobe.  The patient reports he had viral pneumonia in 1989 hospitalized in [place] and was told that she might have scarring in this area based on her clinical presentation and chest x-rays at that time.  She is not sure if she had a CT scan.  She reports significant weight loss since her last visit with diet and exercise.  She did have a failed dental implant and is now taking a more aggressive approach on bone health under the care of endocrinology.  The patient reports she does have a 15% disability based on her shoulder fracture with some limitation of abduction.      Chief Complaint  Patient presents with  . Follow-up     BP (!) 116/92   Pulse 86   Temp 36.8 C (98.3 F)   Ht 154.9 cm (5' 1" )   Wt (!) 108.9 kg (240 lb)   SpO2 96%   BMI 45.35 kg/m       Past Medical History:  Diagnosis Date  . Allergic state   . Blood in urine   . Breast cancer (CMS-HCC) 07/25/2016   10 mm invasive mammary carcinoma of the right breast ER 95%, PR 90%, HER-2/neu not overexpressed  . Breast pain    right  . Dyspnea   . Eczema, unspecified   . Fatigue   . Hashimoto's thyroiditis   . Hematuria   . Hemorrhoid   . History of radiation therapy   . History of syncope   . Hyperlipemia   . Hypertension   . Hypothyroidism   . Joint pain   . Migraines   . Night sweats   . Obesity   . Osteopenia   . Pernicious anemia   .  Sleep apnea 2010          Past Surgical History:  Procedure Laterality Date  . bone graft jaw  05/14/2020  . [place]   x2  . CHOLECYSTECTOMY  1991  . COLONOSCOPY  12/27/2010   Adenomatous Polyp, FH Colon Polyps (Father)  . COLONOSCOPY  10/05/2015   PH Adenomatous Polyp, FH Colon Polyps (Father): CBF 09/2020  . DILATION AND CURETTAGE, DIAGNOSTIC / THERAPEUTIC  2005  . FUNCTIONAL ENDOSCOPIC SINUS SURGERY  2006 and 2009  . HYSTERECTOMY  01/2009  . INCISIONAL BIOPSY BREAST Right 2018  . jaw implant  04/2020  . MASTECTOMY PARTIAL / LUMPECTOMY Right 08/08/2016      OB History    Gravida  3   Para  2   Term      Preterm      AB      Living        SAB      IAB      Ectopic      Molar      Multiple      Live Births          Obstetric Comments  Age at first period 16 Age of first pregnancy 57 Age of menopause 44         Social History          Socioeconomic History  . Marital status: Married  Tobacco Use  . Smoking status: Never Smoker  . Smokeless tobacco: Never Used  Vaping Use  . Vaping Use: Never used  Substance and Sexual Activity  . Alcohol use: Yes    Alcohol/week: 0.0 standard drinks    Comment: socially, very rare   . Drug use: Never  . Sexual activity: Defer    Partners: Male  Social History Narrative   Exercise:  Four times a week.   Diet:  Red meat twice a week.  Fast foods rarely.  Fried foods rarely.      Mammo-03/2015   PAP-01/2014   FLU-09/2015            Allergies  Allergen Reactions  . Niacin Anaphylaxis  . Doxycycline Hives  . Iodinated Contrast Media Hives and Itching  . Iodine Hives  . Keflex [Cephalexin] Rash  . Latex Rash  . Levaquin [Levofloxacin] Other (See Comments)    Tendonitis   . Morphine Unknown  . Other Unknown    Contrast dye  . Penicillin Rash  . Shellfish Containing Products Other (See Comments)    Had reaction to IVP dye,  so they told me to stay away from shellfish   . Shrimp Unknown  . Sulfa (Sulfonamide Antibiotics) Hives, Itching and Rash    Current Medications        Current Outpatient Medications  Medication Sig Dispense Refill  . ascorbic acid, vitamin C, (VITAMIN C) 1000 MG tablet Take 1,000 mg by mouth once daily.    Marland Kitchen BORON ORAL Take by mouth    . calcium carbonate-vitamin D3 536m (1,2562m -600 unit Tab Take by mouth    . cholecalciferol (VITAMIN D3) 2,000 unit tablet Take 2,000 Units by mouth 2 (two) times daily       . cyanocobalamin (VITAMIN B12) 1,000 mcg/mL injection INJECT 1 ML (1,000 MCG TOTAL) INTO THE MUSCLE MONTHLY 3 mL 1  . DIMENHYDRINATE (DRAMAMINE ORAL) Take by mouth.    . diphenhydrAMINE (BENADRYL) 25 mg capsule Take 25 mg by mouth once daily as needed for Itching.    . Marland KitchenPINEPHrine (EPIPEN) 0.3 mg/0.3 mL auto-injector Inject 0.3 mLs (0.3 mg total) into the muscle as needed for Anaphylaxis 1 pen 1  . exemestane (AROMASIN) 25 mg tablet Take 1 tablet (25 mg total) by mouth once daily 90 tablet 3  . fluticasone (FLONASE) 50 mcg/actuation nasal spray Place 2 sprays into both nostrils once daily as needed      . KLOR-CON M20 20 mEq ER tablet TAKE 1 TABLET BY MOUTH ONCE DAILY 90 tablet 0  . losartan (COZAAR) 100 MG tablet TAKE 1 TABLET BY MOUTH EVERY DAY 90 tablet 1  . MAGNESIUM CITRATE ORAL Take by mouth once daily.    . Marland Kitchenhyroid (ARMOUR THYROID) 120 mg tablet Take 1 tablet po in addition to 30 mg tablet totaling 150 mg daily. 90 tablet 1  . thyroid (ARMOUR THYROID) 30 mg tablet TAKE 1 TABLET BY MOUTH IN ADDITION TO 120 MG TABLET TOTALING 150 MG DAILY. 90 tablet 1  . zinc sulfate (ZINC-15 ORAL) Take by mouth once daily.    . polyethylene glycol (MIRALAX) powder One bottle for colonoscopy prep. Use as directed. 255 g 0  Current Facility-Administered Medications  Medication Dose Route Frequency Provider Last Rate Last Admin  . cyanocobalamin (VITAMIN B12)  injection 1,000 mcg  1,000 mcg Intramuscular Q30 Days Orie Fisherman, [place]   1,000 mcg at 05/20/19 1150           Family History  Problem Relation Age of Onset  . High blood pressure (Hypertension) Mother   . Asthma Mother   . Osteoporosis (Thinning of bones) Mother   . Deep vein thrombosis (DVT or abnormal blood clot formation) Mother   . Pulmonary embolism Mother   . Hyperlipidemia (Elevated cholesterol) Mother   . High blood pressure (Hypertension) Father   . Colon polyps Father   . Asthma Father   . Colon cancer Father   . Ovarian cancer Maternal Grandmother   . Cancer Maternal Grandmother   . Colon cancer Paternal Grandmother   . Cancer Paternal Grandmother   . High blood pressure (Hypertension) Other   . Asthma Other   . No Known Problems Son   . No Known Problems Son   . Colon cancer Maternal Grandfather   . Breast cancer Neg Hx    .   Review of Systems  Constitutional: Negative for chills and fever.  Respiratory: Negative for cough.        Objective:   Physical Exam Exam conducted with a chaperone present.  Constitutional:      Appearance: Normal appearance.  Cardiovascular:     Rate and Rhythm: Normal rate and regular rhythm.     Pulses: Normal pulses.     Heart sounds: Normal heart sounds.  Pulmonary:     Effort: Pulmonary effort is normal.     Breath sounds: Normal breath sounds.  Chest:  Breasts:     Right: No axillary adenopathy or supraclavicular adenopathy.     Left: Normal. No axillary adenopathy or supraclavicular adenopathy.     Lymphadenopathy:     Upper Body:     Right upper body: No supraclavicular or axillary adenopathy.     Left upper body: No supraclavicular or axillary adenopathy.  Neurological:     Mental Status: She is alert and oriented to person, place, and time.  Psychiatric:        Mood and Affect: Mood normal.        Behavior: Behavior normal.     Labs and Radiology:    May 01, 2020 PET/CT:  FINDINGS: Mediastinal blood-pool activity (background): SUV max = 3.5  Liver activity (reference): SUV max = N/A  NECK: No hypermetabolic lymph nodes or masses.  Incidental CT findings: None.  CHEST: No hypermetabolic lymphadenopathy. Several small ill-defined areas of airspace opacity are seen as well as a few sub-cm nodules which are all located in the left lower lobe. Several show minimal FDG uptake. These findings favor an atypical infectious or inflammatory process over metastatic disease. No evidence of pleural effusion.  Incidental CT findings: None.  ABDOMEN/PELVIS: No abnormal hypermetabolic activity within the liver, pancreas, adrenal glands, or spleen. No hypermetabolic lymph nodes in the abdomen or pelvis.  Incidental CT findings: Small hiatal hernia noted. Prior cholecystectomy.  SKELETON: No focal hypermetabolic bone lesions to suggest skeletal metastasis.  Incidental CT findings: None.  IMPRESSION: No definite evidence of metastatic disease.  Multifocal patchy airspace opacities and sub-cm nodules in left lower lobe, which likely represents atypical infectious or inflammatory process. Recommend short-term follow-up by chest CT in 3 months  Colonoscopy completed October 05, 2015 by Gaylyn Cheers, MD:  Normal exam, repeat  in 5 years based on past history of polyps.    Assessment:     No evidence of metastatic breast cancers based on recent PET/CT.  Osteoporosis/osteopenia, being followed by endocrinology.  Hormone receptor positive cancer presently on Aromasin.  Excellent weight loss with diet and exercise.  Candidate for follow-up chest CT in 3 months per radiology recommendation based on PET scan.  Candidate for follow-up colonoscopy later this year based on past personal and family history.    Plan:     Arrangements we made for a noncontrast enhanced chest CT in July 2022.  Colonoscopy  July/August 2022.  Mammogram follow-up November 2022.   Entered by Ledell Noss, CMA, acting as a scribe for Dr. Hervey Ard, MD.   The documentation recorded by the scribe accurately reflects the service I personally performed and the decisions made by me.   Robert Bellow, MD FACS

## 2020-06-29 ENCOUNTER — Ambulatory Visit (INDEPENDENT_AMBULATORY_CARE_PROVIDER_SITE_OTHER): Payer: 59 | Admitting: Urology

## 2020-06-29 ENCOUNTER — Other Ambulatory Visit: Payer: Self-pay

## 2020-06-29 ENCOUNTER — Encounter: Payer: Self-pay | Admitting: Urology

## 2020-06-29 VITALS — BP 162/83 | HR 76 | Wt 245.0 lb

## 2020-06-29 DIAGNOSIS — R310 Gross hematuria: Secondary | ICD-10-CM | POA: Diagnosis not present

## 2020-06-29 DIAGNOSIS — R3129 Other microscopic hematuria: Secondary | ICD-10-CM | POA: Diagnosis not present

## 2020-06-29 NOTE — Progress Notes (Signed)
06/29/2020 8:17 AM   Tammy Turner [birth year] [id number]  Referring provider: Juluis Pitch, MD [phone] [place],  [place] [address]  Chief Complaint  Patient presents with  . Other    HPI: I was consulted to assess the patient's microscopic hematuria that she has had for years.  She was worked up in the [facility].  She may have had scarlet fever and nephritis when she was young.  No history of kidney stones bladder surgery or smoking.  No daily aspirin or blood thinner.  She has breast cancer.  She is scheduled for a noncontrast CT scan of the lungs at the end of this month  Normally she voids every 2-3 hours and gets up 3-4 times a night  She is not a diabetic   PMH: Past Medical History:  Diagnosis Date  . Allergic state   . Anemia   . Atypical chest pain    a. 0/2015 Myoview: EF 64%, no ischemia.  . Breast cancer of upper-outer quadrant of right female breast ([place]) 06/2016   ER 95%, PR 90%, HER-2/neu not overexpressed.  . Complication of anesthesia    SEVERE MUSCLE ACHES FROM DIAPHRAGM TO THIGHS X 2 DAYS AFTER PARTIAL MASTECTOMY ON 07-25-16-PT WAS GIVEN SUCCINYLCHOLINE FOR INDUCTION  . Dyspnea on exertion    a. 09/2013 Echo: EF 60-65%, no rwma;  b. 11/2013 Myoview: EF 64%, no ischemia; c. 09/2016 Echo: EF 55-60%, no rwma; d. 12/2019 Echo: EF 60-65%, mod asymm LVH, nl RV fxn, mildly dil LA.   . Family history of ovarian cancer    Pt is My Risk cancer genetic testing neg 2016  . Genetic testing 2016   My Risk negative  . Gross hematuria   . Headache(784.0)   . Hyperlipidemia   . Hypertension   . Hypothyroidism   . Migraines   . Morbid obesity ([place])   . Osteopenia   . Osteopenia   . Palpitations    a. 01/2020 [facility]io: Avg HR 83 (47-129), rare PACs/PVCs. Six atrial runs up to 18 beats (179bpm). NSVT x 2 (up to 5 beats - 200bpm). No prolonged arrhythmias or pauses. Triggered events = sinus rhythm, w/ some PACs/PVCs.  . Pernicious anemia   . Personal history of  radiation therapy   . Sleep apnea 2010   uses CPAP  . Syncope    a. 09/2016 - seen in ED - limited w/u unrevealing; b. 09/2016 Echo: EF 55-60%, no rwma;  b. 10/2016 Event monitor:  no signficant arrhythmias. Occas pac's.    Surgical History: Past Surgical History:  Procedure Laterality Date  . ABDOMINAL HYSTERECTOMY  2011   [place]; Dr. Laurey Morale, due to leio/adenomyosis  . BREAST EXCISIONAL BIOPSY Right 07/25/2016   lumpectomy radation reexcison + anterior margins 08-08-16  . BREAST LUMPECTOMY Right 08/08/2016   Procedure: BREAST RE-EXCISION;  Surgeon: Robert Bellow, MD;  Location: [facility] ORS;  Service: General;  Laterality: Right;  . CESAREAN SECTION     x 2  . CHOLECYSTECTOMY    . COLONOSCOPY    . COLONOSCOPY WITH PROPOFOL N/A 10/05/2015   Procedure: COLONOSCOPY WITH PROPOFOL;  Surgeon: Manya Silvas, MD;  Location: [facility] ENDOSCOPY;  Service: Endoscopy;  Laterality: N/A;  . DILATION AND CURETTAGE OF UTERUS    . FUNCTIONAL ENDOSCOPIC SINUS SURGERY    . PARTIAL MASTECTOMY WITH AXILLARY SENTINEL LYMPH NODE BIOPSY Right 07/25/2016   Procedure: PARTIAL MASTECTOMY WITH AXILLARY SENTINEL LYMPH NODE BIOPSY;  Surgeon: Robert Bellow, MD;  Location: [facility] ORS;  Service: General;  Laterality: Right;    Home Medications:  Allergies as of 06/29/2020      Reactions   Morphine Other (See Comments)   Depressed respirations   Niacin Anaphylaxis   Septra [sulfamethoxazole-trimethoprim] Other (See Comments)   Fever and joint pain   Bisphosphonates    Jaw pain   Doxycycline Hives   Iodine Hives, Itching   Ivp Dye [iodinated Diagnostic Agents] Hives, Itching   Levaquin [levofloxacin In D5w] Other (See Comments)   Tendonitis    Cephalexin Rash   Penicillins Hives, Rash   Has patient had a PCN reaction causing immediate rash, facial/tongue/throat swelling, SOB or lightheadedness with hypotension: No Has patient had a PCN reaction causing severe rash involving mucus membranes or skin necrosis:  No Has patient had a PCN reaction that required hospitalization: No Has patient had a PCN reaction occurring within the last 10 years: Yes If all of the above answers are "NO", then may proceed with Cephalosporin use.   Sulfa Antibiotics Rash, Hives, Itching      Medication List       Accurate as of Jun 29, 2020  8:17 AM. If you have any questions, ask your nurse or doctor.        ASCORBIC ACID PO Take 2 tablets by mouth every evening.   CALCIUM PO Take by mouth. Takes 2-3 times per week   cholecalciferol 1000 units tablet Commonly known as: VITAMIN D Take 1,000 Units by mouth every evening.   DRY EYES OP Apply to eye at bedtime.   EPINEPHrine 0.3 mg/0.3 mL Soaj injection Commonly known as: EPI-PEN INJECT INTRAMUSCULARLY AS DIRECTED   exemestane 25 MG tablet Commonly known as: Aromasin Take 1 tablet (25 mg total) by mouth daily after breakfast.   gabapentin 300 MG capsule Commonly known as: NEURONTIN Take 1 capsule by mouth 3 (three) times daily as needed.   losartan 100 MG tablet Commonly known as: COZAAR Take 100 mg by mouth daily.   MAGNESIUM PO Takes 2-3 times per week   potassium chloride SA 20 MEQ tablet Commonly known as: KLOR-CON Take 20 mEq by mouth daily.   THERATEARS OP Place 1 drop into both eyes 2 (two) times daily as needed (dry eyes).   thyroid 120 MG tablet Commonly known as: ARMOUR Take 120 mg by mouth daily before breakfast.   thyroid 30 MG tablet Commonly known as: ARMOUR Take 30 mg by mouth daily before breakfast.   VITAMIN B-12 IJ Inject 1,000 mcg as directed every 30 (thirty) days.   ZINC SULFATE PO Takes occassionally       Allergies:  Allergies  Allergen Reactions  . Morphine Other (See Comments)    Depressed respirations  . Niacin Anaphylaxis  . Septra [Sulfamethoxazole-Trimethoprim] Other (See Comments)    Fever and joint pain  . Bisphosphonates     Jaw pain  . Doxycycline Hives  . Iodine Hives and Itching  .  Ivp Dye [Iodinated Diagnostic Agents] Hives and Itching  . Levaquin [Levofloxacin In D5w] Other (See Comments)    Tendonitis   . Cephalexin Rash  . Penicillins Hives and Rash    Has patient had a PCN reaction causing immediate rash, facial/tongue/throat swelling, SOB or lightheadedness with hypotension: No Has patient had a PCN reaction causing severe rash involving mucus membranes or skin necrosis: No Has patient had a PCN reaction that required hospitalization: No Has patient had a PCN reaction occurring within the last 10 years: Yes If all of the above  answers are "NO", then may proceed with Cephalosporin use.   . Sulfa Antibiotics Rash, Hives and Itching    Family History: Family History  Problem Relation Age of Onset  . Pulmonary embolism Mother   . Hypertension Mother   . Hyperlipidemia Mother   . Hypertension Father   . Colon cancer Father 67  . Ovarian cancer Maternal Grandmother        51  . Colon cancer Paternal Grandmother        6  . Colon cancer Maternal Grandfather 67  . Fainting Paternal Grandfather   . Breast cancer Neg Hx     Social History:  reports that she has never smoked. She has never used smokeless tobacco. She reports that she does not drink alcohol and does not use drugs.  ROS:                                        Physical Exam: There were no vitals taken for this visit.  Constitutional:  Alert and oriented, No acute distress. HEENT: [place] AT, moist mucus membranes.  Trachea midline, no masses. Cardiovascular: No clubbing, cyanosis, or edema. Respiratory: Normal respiratory effort, no increased work of breathing. GI: Abdomen is soft, nontender, nondistended, no abdominal masses GU: No CVA tenderness.  No distress Skin: No rashes, bruises or suspicious lesions. Lymph: No cervical or inguinal adenopathy. Neurologic: Grossly intact, no focal deficits, moving all 4 extremities. Psychiatric: Normal mood and  affect.  Laboratory Data: Lab Results  Component Value Date   WBC 3.8 11/25/2016   HGB 13.9 11/25/2016   HCT 39.7 11/25/2016   MCV 86.7 11/25/2016   PLT 198 11/25/2016    Lab Results  Component Value Date   CREATININE 0.82 11/25/2016    No results found for: PSA  No results found for: TESTOSTERONE  No results found for: HGBA1C  Urinalysis    Component Value Date/Time   COLORURINE YELLOW (A) 11/25/2016 0742   APPEARANCEUR CLEAR (A) 11/25/2016 0742   LABSPEC 1.017 11/25/2016 0742   PHURINE 5.0 11/25/2016 0742   GLUCOSEU NEGATIVE 11/25/2016 0742   HGBUR MODERATE (A) 11/25/2016 0742   BILIRUBINUR NEGATIVE 11/25/2016 0742   KETONESUR NEGATIVE 11/25/2016 0742   PROTEINUR NEGATIVE 11/25/2016 0742   NITRITE NEGATIVE 11/25/2016 0742   LEUKOCYTESUR NEGATIVE 11/25/2016 0742    Pertinent Imaging: Chart reviewed.  Urine reviewed urine sent for culture.  Assessment & Plan: Patient and I spoke about the contrast issue and work-up of microscopic hematuria.  Return to clinic with renal ultrasound and cystoscopy.  She is also grateful not to have more radiation exposure.  She is at lower risk but we will discuss long-term about CT scans.  She is allergic to iodine with hives.  1. Gross hematuria  - Urinalysis, Complete   No follow-ups on file.  Reece Packer, MD  [facility] [address] [place], [place] [address] [phone]

## 2020-06-29 NOTE — Patient Instructions (Signed)
Cystoscopy Cystoscopy is a procedure that is used to help diagnose and sometimes treat conditions that affect the lower urinary tract. The lower urinary tract includes the bladder and the urethra. The urethra is the tube that drains urine from the bladder. Cystoscopy is done using a thin, tube-shaped instrument with a light and camera at the end (cystoscope). The cystoscope may be hard or flexible, depending on the goal of the procedure. The cystoscope is inserted through the urethra, into the bladder. Cystoscopy may be recommended if you have:  Urinary tract infections that keep coming back.  Blood in the urine (hematuria).  An inability to control when you urinate (urinary incontinence) or an overactive bladder.  Unusual cells found in a urine sample.  A blockage in the urethra, such as a urinary stone.  Painful urination.  An abnormality in the bladder found during an intravenous pyelogram (IVP) or CT scan. Cystoscopy may also be done to remove a sample of tissue to be examined under a microscope (biopsy). What are the risks? Generally, this is a safe procedure. However, problems may occur, including:  Infection.  Bleeding.  What happens during the procedure?  1. You will be given one or more of the following: ? A medicine to numb the area (local anesthetic). 2. The area around the opening of your urethra will be cleaned. 3. The cystoscope will be passed through your urethra into your bladder. 4. Germ-free (sterile) fluid will flow through the cystoscope to fill your bladder. The fluid will stretch your bladder so that your health care provider can clearly examine your bladder walls. 5. Your doctor will look at the urethra and bladder. 6. The cystoscope will be removed The procedure may vary among health care providers  What can I expect after the procedure? After the procedure, it is common to have: 1. Some soreness or pain in your abdomen and urethra. 2. Urinary symptoms.  These include: ? Mild pain or burning when you urinate. Pain should stop within a few minutes after you urinate. This may last for up to 1 week. ? A small amount of blood in your urine for several days. ? Feeling like you need to urinate but producing only a small amount of urine. Follow these instructions at home: General instructions  Return to your normal activities as told by your health care provider.   Do not drive for 24 hours if you were given a sedative during your procedure.  Watch for any blood in your urine. If the amount of blood in your urine increases, call your health care provider.  If a tissue sample was removed for testing (biopsy) during your procedure, it is up to you to get your test results. Ask your health care provider, or the department that is doing the test, when your results will be ready.  Drink enough fluid to keep your urine pale yellow.  Keep all follow-up visits as told by your health care provider. This is important. Contact a health care provider if you:  Have pain that gets worse or does not get better with medicine, especially pain when you urinate.  Have trouble urinating.  Have more blood in your urine. Get help right away if you:  Have blood clots in your urine.  Have abdominal pain.  Have a fever or chills.  Are unable to urinate. Summary  Cystoscopy is a procedure that is used to help diagnose and sometimes treat conditions that affect the lower urinary tract.  Cystoscopy is done using   a thin, tube-shaped instrument with a light and camera at the end.  After the procedure, it is common to have some soreness or pain in your abdomen and urethra.  Watch for any blood in your urine. If the amount of blood in your urine increases, call your health care provider.  If you were prescribed an antibiotic medicine, take it as told by your health care provider. Do not stop taking the antibiotic even if you start to feel better. This  information is not intended to replace advice given to you by your health care provider. Make sure you discuss any questions you have with your health care provider. Document Revised: 01/16/2018 Document Reviewed: 01/16/2018 Elsevier Patient Education  2020 [employer]   

## 2020-06-30 LAB — URINALYSIS, COMPLETE
Bilirubin, UA: NEGATIVE
Glucose, UA: NEGATIVE
Nitrite, UA: NEGATIVE
Protein,UA: NEGATIVE
Specific Gravity, UA: 1.025 (ref 1.005–1.030)
Urobilinogen, Ur: 0.2 mg/dL (ref 0.2–1.0)
pH, UA: 5.5 (ref 5.0–7.5)

## 2020-06-30 LAB — MICROSCOPIC EXAMINATION: Bacteria, UA: NONE SEEN

## 2020-07-02 LAB — CULTURE, URINE COMPREHENSIVE

## 2020-08-05 ENCOUNTER — Other Ambulatory Visit: Payer: Self-pay

## 2020-08-05 ENCOUNTER — Ambulatory Visit
Admission: RE | Admit: 2020-08-05 | Discharge: 2020-08-05 | Disposition: A | Discharge status: Home / Self Care | Payer: 59 | Source: Ambulatory Visit | Attending: General Surgery | Admitting: General Surgery

## 2020-08-05 ENCOUNTER — Ambulatory Visit
Admission: RE | Admit: 2020-08-05 | Discharge: 2020-08-05 | Disposition: A | Discharge status: Home / Self Care | Payer: 59 | Source: Ambulatory Visit | Attending: Urology | Admitting: Urology

## 2020-08-05 DIAGNOSIS — R911 Solitary pulmonary nodule: Secondary | ICD-10-CM

## 2020-08-05 DIAGNOSIS — R310 Gross hematuria: Secondary | ICD-10-CM

## 2020-08-13 ENCOUNTER — Other Ambulatory Visit: Payer: Self-pay | Admitting: General Surgery

## 2020-08-13 DIAGNOSIS — M546 Pain in thoracic spine: Secondary | ICD-10-CM

## 2020-08-13 DIAGNOSIS — G8929 Other chronic pain: Secondary | ICD-10-CM

## 2020-08-13 NOTE — Progress Notes (Signed)
Subjective:     Patient ID: Tammy Turner is a 60 y.o. female.   HPI   The following portions of the patient's history were reviewed and updated as appropriate.   This an established patient is here today for: office visit. The patient is here today to follow up from a recent CT of her chest. Patient states that she has 3 broken ribs. She had a muscle spasm while on an airplane on a trip to [place].    The patient has been seeing orthopedics for chronic mid back pain.  Some PT has been offered.  The patient had an MRI of the spine earlier this year raising a question of metastatic disease.  Subsequent PET scan did not suggest this was the case.   She did have a follow-up chest CT due to questions raised during the PET and these are noted below.   The patient is frustrated by the chronic back pain, and would like to have an answer as to what could be done to improve it.  The orthopedist has raised the possibility that her breast size, 40 2D, is contributing to her symptoms.   The patient has a colonoscopy scheduled for 08-21-20.          Chief Complaint  Patient presents with   Follow-up      CT chest      BP 118/68   Pulse 83   Temp 36.6 C (97.8 F)   Ht 154.9 cm (_0 )   Wt (!) 106.1 kg (234 lb)   SpO2 97%   BMI 44.21 kg/m        Past Medical History:  Diagnosis Date   Allergic state     Blood in urine     Breast cancer (CMS-HCC) 07/25/2016    10 mm invasive mammary carcinoma of the right breast ER 95%, PR 90%, HER-2/neu not overexpressed   Breast pain      right   Dyspnea     Eczema, unspecified     Fatigue     Hashimoto's thyroiditis     Hematuria     Hemorrhoid     History of radiation therapy     History of syncope     Hyperlipemia     Hypertension     Hypothyroidism     Joint pain     Migraines     Night sweats     Obesity     Osteopenia      Bone density September 20, 2018: Osteopenia, T score -2.6 spine, -2.4 femur.   Pernicious anemia     Sleep  apnea 2010           Past Surgical History:  Procedure Laterality Date   bone graft jaw   05/14/2020   CESAREAN SECTION   1988 and 1993    x2   CHOLECYSTECTOMY   1991   COLONOSCOPY   12/27/2010    Adenomatous Polyp, FH Colon Polyps (Father)   COLONOSCOPY   10/05/2015    PH Adenomatous Polyp, FH Colon Polyps (Father): CBF 09/2020   DILATION AND CURETTAGE, DIAGNOSTIC / THERAPEUTIC   2005   FUNCTIONAL ENDOSCOPIC SINUS SURGERY   2006 and 2009   HYSTERECTOMY   01/2009   INCISIONAL BIOPSY BREAST Right 2018   jaw implant   04/2020   MASTECTOMY PARTIAL / LUMPECTOMY Right 08/08/2016                OB History     Gravida  3   Para  2   Term      Preterm      AB      Living         SAB      IAB      Ectopic      Molar      Multiple      Live Births           Obstetric Comments  Age at first period 48 Age of first pregnancy 24 Age of menopause 79             Social History           Socioeconomic History   Marital status: Married  Tobacco Use   Smoking status: Never Smoker   Smokeless tobacco: Never Used  Scientific laboratory technician Use: Never used  Substance and Sexual Activity   Alcohol use: Yes      Alcohol/week: 0.0 standard drinks      Comment: socially, very rare    Drug use: Never   Sexual activity: Defer      Partners: Male  Social History Narrative    Exercise:  Four times a week.    Diet:  Red meat twice a week.  Fast foods rarely.  Fried foods rarely.         Mammo-03/2015    PAP-01/2014    FLU-09/2015             Allergies  Allergen Reactions   Niacin Anaphylaxis   Bisphosphonates Unknown      Jaw pain   Doxycycline Hives   Iodinated Contrast Media Hives and Itching   Iodine Hives   Keflex [Cephalexin] Rash   Latex Rash   Levaquin [Levofloxacin] Other (See Comments)      Tendonitis     Morphine Unknown   Other Unknown      Contrast dye   Penicillin Rash   Shellfish Containing Products Other (See Comments)      Had  reaction to IVP dye, so they told me to stay away from shellfish    Shrimp Unknown   Sulfa (Sulfonamide Antibiotics) Hives, Itching and Rash      Current Medications        Current Outpatient Medications  Medication Sig Dispense Refill   ascorbic acid, vitamin C, (VITAMIN C) 1000 MG tablet Take 1,000 mg by mouth once daily.       BORON ORAL Take by mouth       calcium carbonate-vitamin D3 532m (1,2535m -600 unit Tab Take by mouth       cholecalciferol (VITAMIN D3) 2,000 unit tablet Take 2,000 Units by mouth 2 (two) times daily          cyanocobalamin (VITAMIN B12) 1,000 mcg/mL injection INJECT 1 ML (1,000 MCG TOTAL) INTO THE MUSCLE MONTHLY 3 mL 1   cyclobenzaprine (FLEXERIL) 5 MG tablet Take 1 tablet (5 mg total) by mouth 3 (three) times daily as needed for Muscle spasms for up to 20 doses 20 tablet 0   DIMENHYDRINATE (DRAMAMINE ORAL) Take by mouth.       diphenhydrAMINE (BENADRYL) 25 mg capsule Take 25 mg by mouth once daily as needed for Itching.       EPINEPHrine (EPIPEN) 0.3 mg/0.3 mL auto-injector Inject 0.3 mLs (0.3 mg total) into the muscle as needed for Anaphylaxis 1 pen 1   exemestane (AROMASIN) 25 mg tablet Take 1 tablet (25 mg total) by mouth once daily 90  tablet 3   fluticasone (FLONASE) 50 mcg/actuation nasal spray Place 2 sprays into both nostrils once daily as needed         KLOR-CON M20 20 mEq ER tablet TAKE 1 TABLET BY MOUTH ONCE DAILY 90 tablet 1   losartan (COZAAR) 100 MG tablet TAKE 1 TABLET BY MOUTH EVERY DAY 90 tablet 1   MAGNESIUM CITRATE ORAL Take by mouth once daily.       meloxicam (MOBIC) 15 MG tablet Take 1 tablet (15 mg total) by mouth once daily for 14 doses 14 tablet 0   polyethylene glycol (MIRALAX) powder One bottle for colonoscopy prep. Use as directed. 255 g 0   thyroid (ARMOUR THYROID) 120 mg tablet TAKE 1 TABLET (120 MG TOTAL) BY MOUTH EVERY MORNING BEFORE BREAKFAST (0630) 90 tablet 1   thyroid (ARMOUR THYROID) 30 mg tablet TAKE 1 TABLET BY MOUTH IN  ADDITION TO 120 MG TABLET TOTALING 150 MG DAILY. 90 tablet 1   zinc sulfate (ZINC-15 ORAL) Take by mouth once daily.       HYDROcodone-acetaminophen (NORCO) 5-325 mg tablet Take 1 tablet by mouth every 6 (six) hours as needed for Pain for up to 20 doses (Patient not taking: Reported on 08/13/2020) 20 tablet 0             Current Facility-Administered Medications  Medication Dose Route Frequency Provider Last Rate Last Admin   cyanocobalamin (VITAMIN B12) injection 1,000 mcg  1,000 mcg Intramuscular Q30 Days Orie Fisherman, PA   1,000 mcg at 05/20/19 1150             Family History  Problem Relation Age of Onset   High blood pressure (Hypertension) Mother     Asthma Mother     Osteoporosis (Thinning of bones) Mother     Deep vein thrombosis (DVT or abnormal blood clot formation) Mother     Pulmonary embolism Mother     Hyperlipidemia (Elevated cholesterol) Mother     High blood pressure (Hypertension) Father     Colon polyps Father     Asthma Father     Colon cancer Father     Ovarian cancer Maternal Grandmother     Cancer Maternal Grandmother     Colon cancer Paternal Grandmother     Cancer Paternal Grandmother     High blood pressure (Hypertension) Other     Asthma Other     No Known Problems Son     No Known Problems Son     Colon cancer Maternal Grandfather     Breast cancer Neg Hx              Review of Systems  Constitutional: Negative for chills and fever.  Respiratory: Negative for cough.          Objective:   Physical Exam Exam conducted with a chaperone present.  Constitutional:      Appearance: Normal appearance.  Cardiovascular:     Rate and Rhythm: Normal rate and regular rhythm.     Pulses: Normal pulses.     Heart sounds: Normal heart sounds.  Pulmonary:     Effort: Pulmonary effort is normal.     Breath sounds: Normal breath sounds.     Comments: Tenderness along the lateral rib cage, T8-9. Musculoskeletal:     Cervical back:  Neck supple.  Skin:    General: Skin is warm and dry.  Neurological:     Mental Status: She is alert and oriented to person, place, and  time.  Psychiatric:        Mood and Affect: Mood normal.        Behavior: Behavior normal.      Labs and Radiology:      August 05, 2020 chest CT summary:   IMPRESSION: 1. Interval resolution of centrilobular ground-glass opacities of the LEFT lower lobe. There are several persistent scattered pulmonary nodules including a slowly enlarging non FDG avid pulmonary nodule of the LEFT lower lobe which may contain fat density. Recommend follow-up CT in 6 months to 1 year to assess for stability.        Assessment:     Chest CT with essentially stable findings, follow-up in 1 year.   Back pain, possible related to macromastia.    Plan:     The case was reviewed with plastics.  The patient had been familiar with a friend who had been seen by Dr. Marla Roe and requested referral to her office.   Colonoscopy next week as scheduled.   Mammograms in November 2022.   Bone density will be due at the same time.      This note is partially prepared by Ledell Noss, CMA acting as a scribe in the presence of Dr. Hervey Ard, MD.    The documentation recorded by the scribe accurately reflects the service I personally performed and the decisions made by me.    Robert Bellow, MD FACS

## 2020-08-20 ENCOUNTER — Encounter: Payer: Self-pay | Admitting: General Surgery

## 2020-08-21 ENCOUNTER — Encounter: Payer: Self-pay | Admitting: General Surgery

## 2020-08-21 ENCOUNTER — Encounter
Admission: RE | Disposition: A | Discharge status: Home / Self Care | Payer: Self-pay | Source: Home / Self Care | Attending: General Surgery

## 2020-08-21 ENCOUNTER — Other Ambulatory Visit: Payer: Self-pay

## 2020-08-21 ENCOUNTER — Ambulatory Visit: Payer: 59 | Admitting: Certified Registered"

## 2020-08-21 ENCOUNTER — Ambulatory Visit
Admission: RE | Admit: 2020-08-21 | Discharge: 2020-08-21 | Disposition: A | Discharge status: Home / Self Care | Payer: 59 | Attending: General Surgery | Admitting: General Surgery

## 2020-08-21 DIAGNOSIS — Z885 Allergy status to narcotic agent status: Secondary | ICD-10-CM | POA: Insufficient documentation

## 2020-08-21 DIAGNOSIS — Z881 Allergy status to other antibiotic agents status: Secondary | ICD-10-CM | POA: Diagnosis not present

## 2020-08-21 DIAGNOSIS — Z79899 Other long term (current) drug therapy: Secondary | ICD-10-CM | POA: Diagnosis not present

## 2020-08-21 DIAGNOSIS — Z888 Allergy status to other drugs, medicaments and biological substances status: Secondary | ICD-10-CM | POA: Diagnosis not present

## 2020-08-21 DIAGNOSIS — Z882 Allergy status to sulfonamides status: Secondary | ICD-10-CM | POA: Diagnosis not present

## 2020-08-21 DIAGNOSIS — Z880 Allergy status to penicillin: Secondary | ICD-10-CM | POA: Insufficient documentation

## 2020-08-21 DIAGNOSIS — Z8719 Personal history of other diseases of the digestive system: Secondary | ICD-10-CM | POA: Insufficient documentation

## 2020-08-21 DIAGNOSIS — Z1211 Encounter for screening for malignant neoplasm of colon: Secondary | ICD-10-CM | POA: Insufficient documentation

## 2020-08-21 DIAGNOSIS — Z6841 Body Mass Index (BMI) 40.0 and over, adult: Secondary | ICD-10-CM | POA: Diagnosis not present

## 2020-08-21 HISTORY — PX: COLONOSCOPY WITH PROPOFOL: SHX5780

## 2020-08-21 SURGERY — COLONOSCOPY WITH PROPOFOL
Anesthesia: General

## 2020-08-21 MED ORDER — PHENYLEPHRINE HCL (PRESSORS) 10 MG/ML IV SOLN
INTRAVENOUS | Status: AC
  Filled 2020-08-21: qty 1

## 2020-08-21 MED ORDER — LIDOCAINE HCL (CARDIAC) PF 100 MG/5ML IV SOSY
PREFILLED_SYRINGE | INTRAVENOUS | Status: DC | PRN
  Administered 2020-08-21: 50 mg via INTRAVENOUS

## 2020-08-21 MED ORDER — PROPOFOL 10 MG/ML IV BOLUS
INTRAVENOUS | Status: DC | PRN
  Administered 2020-08-21: 70 mg via INTRAVENOUS

## 2020-08-21 MED ORDER — SODIUM CHLORIDE 0.9 % IV SOLN: INTRAVENOUS | Status: DC

## 2020-08-21 MED ORDER — PROPOFOL 10 MG/ML IV BOLUS
INTRAVENOUS | Status: AC
  Filled 2020-08-21: qty 20

## 2020-08-21 MED ORDER — PROPOFOL 500 MG/50ML IV EMUL
INTRAVENOUS | Status: DC | PRN
  Administered 2020-08-21: 150 ug/kg/min via INTRAVENOUS

## 2020-08-21 MED ORDER — PROPOFOL 500 MG/50ML IV EMUL
INTRAVENOUS | Status: AC
  Filled 2020-08-21: qty 50

## 2020-08-21 NOTE — Anesthesia Postprocedure Evaluation (Signed)
Anesthesia Post Note  Patient: Tammy Turner  Procedure(s) Performed: COLONOSCOPY WITH PROPOFOL  Patient location during evaluation: Endoscopy Anesthesia Type: General Level of consciousness: awake and alert Pain management: pain level controlled Vital Signs Assessment: post-procedure vital signs reviewed and stable Respiratory status: spontaneous breathing, nonlabored ventilation, respiratory function stable and patient connected to nasal cannula oxygen Cardiovascular status: blood pressure returned to baseline and stable Postop Assessment: no apparent nausea or vomiting Anesthetic complications: no   No notable events documented.   Last Vitals:  Vitals:   08/21/20 0808 08/21/20 0818  BP: 118/80 128/81  Pulse: 73 70  Resp: 14 (!) 24  Temp:    SpO2: 100% 98%    Last Pain:  Vitals:   08/21/20 0818  TempSrc:   PainSc: 0-No pain                 Precious Haws Kera Deacon

## 2020-08-21 NOTE — Transfer of Care (Signed)
Immediate Anesthesia Transfer of Care Note  Patient: Tammy Turner  Procedure(s) Performed: COLONOSCOPY WITH PROPOFOL  Patient Location: PACU and Endoscopy Unit  Anesthesia Type:General  Level of Consciousness: drowsy  Airway & Oxygen Therapy: Patient Spontanous Breathing  Post-op Assessment: Report given to RN  Post vital signs: stable  Last Vitals:  Vitals Value Taken Time  BP    Temp    Pulse    Resp    SpO2      Last Pain:  Vitals:   08/21/20 0703  TempSrc: Temporal  PainSc: 0-No pain         Complications: No notable events documented.

## 2020-08-21 NOTE — Anesthesia Preprocedure Evaluation (Signed)
Anesthesia Evaluation  Patient identified by MRN, date of birth, ID band Patient awake    Reviewed: Allergy & Precautions, H&P , NPO status , Patient's Chart, lab work & pertinent test results, reviewed documented beta blocker date and time   History of Anesthesia Complications (+) PONV and history of anesthetic complications  Airway Mallampati: III   Neck ROM: full    Dental  (+) Chipped, Missing   Pulmonary neg pulmonary ROS, shortness of breath and with exertion, sleep apnea and Continuous Positive Airway Pressure Ventilation ,    Pulmonary exam normal        Cardiovascular hypertension, negative cardio ROS Normal cardiovascular exam Rhythm:regular Rate:Normal     Neuro/Psych  Headaches, negative neurological ROS  negative psych ROS   GI/Hepatic negative GI ROS, Neg liver ROS,   Endo/Other  negative endocrine ROSHypothyroidism   Renal/GU negative Renal ROS  negative genitourinary   Musculoskeletal   Abdominal   Peds  Hematology  (+) Blood dyscrasia, anemia ,   Anesthesia Other Findings Past Medical History: No date: Allergic state No date: Allergic state No date: Anemia No date: Anemia No date: Gross hematuria No date: Headache(784.0) No date: Hyperlipidemia No date: Hyperlipidemia No date: Hypertension No date: Hypothyroidism No date: Hypothyroidism No date: Migraines No date: Morbid obesity ([place]) No date: Osteopenia No date: Osteopenia No date: Pernicious anemia No date: Sleep apnea No date: Sleep apnea Past Surgical History: No date: ABDOMINAL HYSTERECTOMY No date: CESAREAN SECTION No date: CHOLECYSTECTOMY No date: COLONOSCOPY No date: DILATION AND CURETTAGE OF UTERUS No date: FUNCTIONAL ENDOSCOPIC SINUS SURGERY BMI    Body Mass Index:  50.83 kg/m     Reproductive/Obstetrics                             Anesthesia Physical  Anesthesia Plan  ASA:  III  Anesthesia Plan: General   Post-op Pain Management:    Induction: Intravenous  PONV Risk Score and Plan: TIVA and Propofol infusion  Airway Management Planned: Natural Airway and Nasal Cannula  Additional Equipment:   Intra-op Plan:   Post-operative Plan:   Informed Consent: I have reviewed the patients History and Physical, chart, labs and discussed the procedure including the risks, benefits and alternatives for the proposed anesthesia with the patient or authorized representative who has indicated his/her understanding and acceptance.     Dental Advisory Given  Plan Discussed with: CRNA  Anesthesia Plan Comments: (Patient consented for risks of anesthesia including but not limited to:  - adverse reactions to medications - risk of airway placement if required - damage to eyes, teeth, lips or other oral mucosa - nerve damage due to positioning  - sore throat or hoarseness - Damage to heart, brain, nerves, lungs, other parts of body or loss of life  Patient voiced understanding.)        Anesthesia Quick Evaluation

## 2020-08-21 NOTE — Op Note (Addendum)
[facility] Gastroenterology Patient Name: Tammy Turner Procedure Date: 08/21/2020 7:22 AM MRN: [id number] Account #: [id number] Date of Birth: [birth year] Admit Type: Outpatient Age: 60 Room: [facility] ENDO ROOM 1 Gender: Female Note Status: Supervisor Override Procedure:             Colonoscopy Indications:           Personal history of colonic polyps Providers:             Robert Bellow, MD Medicines:             Propofol per Anesthesia Complications:         No immediate complications. Procedure:             Pre-Anesthesia Assessment:                        - Prior to the procedure, a History and Physical was                         performed, and patient medications, allergies and                         sensitivities were reviewed. The patient's tolerance                         of previous anesthesia was reviewed.                        - The risks and benefits of the procedure and the                         sedation options and risks were discussed with the                         patient. All questions were answered and informed                         consent was obtained.                        After obtaining informed consent, the colonoscope was                         passed under direct vision. Throughout the procedure,                         the patient's blood pressure, pulse, and oxygen                         saturations were monitored continuously. The                         Colonoscope was introduced through the anus and                         advanced to the the cecum, identified by appendiceal                         orifice and ileocecal valve. The colonoscopy was  performed without difficulty. The patient tolerated                         the procedure well. The quality of the bowel                         preparation was excellent. Findings:      The entire examined colon appeared normal on direct and  retroflexion       views. Impression:            - The entire examined colon is normal on direct and                         retroflexion views.                        - No specimens collected. Recommendation:        - Repeat colonoscopy in 7 years for surveillance. Procedure Code(s):     --- Professional ---                        [phone], Colonoscopy, flexible; diagnostic, including                         collection of specimen(s) by brushing or washing, when                         performed (separate procedure) Diagnosis Code(s):     --- Professional ---                        Z12.11, Encounter for screening for malignant neoplasm                         of colon CPT copyright 2019 American Medical Association. All rights reserved. The codes documented in this report are preliminary and upon coder review may  be revised to meet current compliance requirements. Robert Bellow, MD 08/21/2020 7:56:49 AM This report has been signed electronically. Number of Addenda: 0 Note Initiated On: 08/21/2020 7:22 AM Scope Withdrawal Time: 0 hours 7 minutes 58 seconds  Total Procedure Duration: 0 hours 12 minutes 41 seconds  Estimated Blood Loss:  Estimated blood loss: none.      [facility]

## 2020-08-21 NOTE — H&P (Signed)
Tammy Turner [id number] 11/20/1960     HPI:  60 y/o woman with history of colonic polyps on 2/3 prior colonoscopies. (Normal in 2017). For repeat exam. Tolerated prep well.   Medications Prior to Admission  Medication Sig Dispense Refill Last Dose   ASCORBIC ACID PO Take 2 tablets by mouth every evening.   Past Week   CALCIUM PO Take by mouth. Takes 2-3 times per week   Past Week   cholecalciferol (VITAMIN D) 1000 units tablet Take 1,000 Units by mouth every evening.   08/20/2020   Cyanocobalamin (VITAMIN B-12 IJ) Inject 1,000 mcg as directed every 30 (thirty) days.   Past Week   dimenhyDRINATE (DRAMAMINE) 50 MG tablet Take 50 mg by mouth every 8 (eight) hours as needed.   Past Month   diphenhydrAMINE (BENADRYL) 25 mg capsule Take 25 mg by mouth once as needed.   Past Month   exemestane (AROMASIN) 25 MG tablet Take 1 tablet (25 mg total) by mouth daily after breakfast. 90 tablet 4 08/20/2020   losartan (COZAAR) 100 MG tablet Take 100 mg by mouth daily.   Past Week   MAGNESIUM PO Takes 2-3 times per week   Past Week   potassium chloride SA (KLOR-CON) 20 MEQ tablet Take 20 mEq by mouth daily.   Past Week   thyroid (ARMOUR) 120 MG tablet Take 120 mg by mouth daily before breakfast.   Past Week   ZINC SULFATE PO Takes occassionally   Past Week   Artificial Tear Ointment (DRY EYES OP) Apply to eye at bedtime.      Carboxymethylcellulose Sodium (THERATEARS OP) Place 1 drop into both eyes 2 (two) times daily as needed (dry eyes).      EPINEPHrine 0.3 mg/0.3 mL IJ SOAJ injection INJECT INTRAMUSCULARLY AS DIRECTED      gabapentin (NEURONTIN) 300 MG capsule Take 1 capsule by mouth 3 (three) times daily as needed. (Patient not taking: Reported on 08/21/2020)   Not Taking   Allergies  Allergen Reactions   Morphine Other (See Comments)    Depressed respirations   Niacin Anaphylaxis   Septra [Sulfamethoxazole-Trimethoprim] Other (See Comments)    Fever and joint pain   Bisphosphonates     Jaw pain    Doxycycline Hives   Iodine Hives and Itching   Ivp Dye [Iodinated Diagnostic Agents] Hives and Itching   Levaquin [Levofloxacin In D5w] Other (See Comments)    Tendonitis    Cephalexin Rash   Penicillins Hives and Rash    Has patient had a PCN reaction causing immediate rash, facial/tongue/throat swelling, SOB or lightheadedness with hypotension: No Has patient had a PCN reaction causing severe rash involving mucus membranes or skin necrosis: No Has patient had a PCN reaction that required hospitalization: No Has patient had a PCN reaction occurring within the last 10 years: Yes If all of the above answers are "NO", then may proceed with Cephalosporin use.    Sulfa Antibiotics Rash, Hives and Itching   Past Medical History:  Diagnosis Date   Allergic state    Anemia    Atypical chest pain    a. 0/2015 Myoview: EF 64%, no ischemia.   Breast cancer of upper-outer quadrant of right female breast ([place]) 06/2016   ER 95%, PR 90%, HER-2/neu not overexpressed.   Complication of anesthesia    SEVERE MUSCLE ACHES FROM DIAPHRAGM TO THIGHS X 2 DAYS AFTER PARTIAL MASTECTOMY ON 07-25-16-PT WAS GIVEN SUCCINYLCHOLINE FOR INDUCTION   Dyspnea on exertion    a.  09/2013 Echo: EF 60-65%, no rwma;  b. 11/2013 Myoview: EF 64%, no ischemia; c. 09/2016 Echo: EF 55-60%, no rwma; d. 12/2019 Echo: EF 60-65%, mod asymm LVH, nl RV fxn, mildly dil LA.    Family history of ovarian cancer    Pt is My Risk cancer genetic testing neg 2016   Genetic testing 2016   My Risk negative   Gross hematuria    Headache(784.0)    Hyperlipidemia    Hypertension    Hypothyroidism    Migraines    Morbid obesity ([place])    Osteopenia    Osteopenia    Palpitations    a. 01/2020 Zio: Avg HR 83 (47-129), rare PACs/PVCs. Six atrial runs up to 18 beats (179bpm). NSVT x 2 (up to 5 beats - 200bpm). No prolonged arrhythmias or pauses. Triggered events = sinus rhythm, w/ some PACs/PVCs.   Pernicious anemia    Personal history of  radiation therapy    Sleep apnea 2010   uses CPAP   Syncope    a. 09/2016 - seen in ED - limited w/u unrevealing; b. 09/2016 Echo: EF 55-60%, no rwma;  b. 10/2016 Event monitor:  no signficant arrhythmias. Occas pac's.   Past Surgical History:  Procedure Laterality Date   ABDOMINAL HYSTERECTOMY  2011   [place]; Dr. Laurey Morale, due to leio/adenomyosis   BREAST EXCISIONAL BIOPSY Right 07/25/2016   lumpectomy radation reexcison + anterior margins 08-08-16   BREAST LUMPECTOMY Right 08/08/2016   Procedure: BREAST RE-EXCISION;  Surgeon: Robert Bellow, MD;  Location: [facility] ORS;  Service: General;  Laterality: Right;   CESAREAN SECTION     x 2   CHOLECYSTECTOMY     COLONOSCOPY     COLONOSCOPY WITH PROPOFOL N/A 10/05/2015   Procedure: COLONOSCOPY WITH PROPOFOL;  Surgeon: Manya Silvas, MD;  Location[facility] ENDOSCOPY;  Service: Endoscopy;  Laterality: N/A;   DILATION AND CURETTAGE OF UTERUS     FUNCTIONAL ENDOSCOPIC SINUS SURGERY     PARTIAL MASTECTOMY WITH AXILLARY SENTINEL LYMPH NODE BIOPSY Right 07/25/2016   Procedure: PARTIAL MASTECTOMY WITH AXILLARY SENTINEL LYMPH NODE BIOPSY;  Surgeon: Robert Bellow, MD;  Location: [facility] ORS;  Service: General;  Laterality: Right;   Social History   Socioeconomic History   Marital status: Married    Spouse name: Not on file   Number of children: Not on file   Years of education: Not on file   Highest education level: Not on file  Occupational History   Not on file  Tobacco Use   Smoking status: Never   Smokeless tobacco: Never  Vaping Use   Vaping Use: Never used  Substance and Sexual Activity   Alcohol use: No   Drug use: No   Sexual activity: Yes    Birth control/protection: Surgical    Comment: Hysterectomy  Other Topics Concern   Not on file  Social History Narrative   Not on file   Social Determinants of Health   Financial Resource Strain: Not on file  Food Insecurity: Not on file  Transportation Needs: Not on file  Physical  Activity: Not on file  Stress: Not on file  Social Connections: Not on file  Intimate Partner Violence: Not on file   Social History   Social History Narrative   Not on file     ROS: Negative.     PE: HEENT: Negative. Lungs: Clear. Cardio: RR.   Assessment/Plan:  Proceed with planned endoscopy.   Forest Gleason [facility] 08/21/2020

## 2020-08-24 ENCOUNTER — Other Ambulatory Visit: Payer: Self-pay | Admitting: Urology

## 2020-08-24 ENCOUNTER — Encounter: Payer: Self-pay | Admitting: General Surgery

## 2020-09-17 ENCOUNTER — Emergency Department
Admission: EM | Admit: 2020-09-17 | Discharge: 2020-09-17 | Disposition: A | Discharge status: Home / Self Care | Payer: 59 | Attending: Emergency Medicine | Admitting: Emergency Medicine

## 2020-09-17 ENCOUNTER — Other Ambulatory Visit: Payer: Self-pay

## 2020-09-17 ENCOUNTER — Encounter: Payer: Self-pay | Admitting: Emergency Medicine

## 2020-09-17 DIAGNOSIS — Z79899 Other long term (current) drug therapy: Secondary | ICD-10-CM | POA: Insufficient documentation

## 2020-09-17 DIAGNOSIS — E860 Dehydration: Secondary | ICD-10-CM | POA: Diagnosis not present

## 2020-09-17 DIAGNOSIS — E039 Hypothyroidism, unspecified: Secondary | ICD-10-CM | POA: Diagnosis not present

## 2020-09-17 DIAGNOSIS — K529 Noninfective gastroenteritis and colitis, unspecified: Secondary | ICD-10-CM

## 2020-09-17 DIAGNOSIS — Z853 Personal history of malignant neoplasm of breast: Secondary | ICD-10-CM | POA: Insufficient documentation

## 2020-09-17 DIAGNOSIS — I10 Essential (primary) hypertension: Secondary | ICD-10-CM | POA: Insufficient documentation

## 2020-09-17 DIAGNOSIS — Z20822 Contact with and (suspected) exposure to covid-19: Secondary | ICD-10-CM | POA: Diagnosis not present

## 2020-09-17 LAB — CBC
HCT: 42.2 % (ref 36.0–46.0)
Hemoglobin: 14.6 g/dL (ref 12.0–15.0)
MCH: 31.5 pg (ref 26.0–34.0)
MCHC: 34.6 g/dL (ref 30.0–36.0)
MCV: 90.9 fL (ref 80.0–100.0)
Platelets: 236 10*3/uL (ref 150–400)
RBC: 4.64 MIL/uL (ref 3.87–5.11)
RDW: 12.9 % (ref 11.5–15.5)
WBC: 7.5 10*3/uL (ref 4.0–10.5)
nRBC: 0 % (ref 0.0–0.2)

## 2020-09-17 LAB — BASIC METABOLIC PANEL
Anion gap: 7 (ref 5–15)
BUN: 14 mg/dL (ref 6–20)
CO2: 25 mmol/L (ref 22–32)
Calcium: 8.9 mg/dL (ref 8.9–10.3)
Chloride: 103 mmol/L (ref 98–111)
Creatinine, Ser: 0.56 mg/dL (ref 0.44–1.00)
GFR, Estimated: >60 mL/min (ref >60)
Glucose, Bld: 93 mg/dL (ref 70–99)
Potassium: 3.6 mmol/L (ref 3.5–5.1)
Sodium: 135 mmol/L (ref 135–145)

## 2020-09-17 LAB — URINALYSIS, COMPLETE (UACMP) WITH MICROSCOPIC
Bacteria, UA: NONE SEEN
Bilirubin Urine: NEGATIVE
Glucose, UA: NEGATIVE mg/dL
Ketones, ur: NEGATIVE mg/dL
Nitrite: NEGATIVE
Protein, ur: NEGATIVE mg/dL
Specific Gravity, Urine: 1.015 (ref 1.005–1.030)
pH: 5 (ref 5.0–8.0)

## 2020-09-17 LAB — RESP PANEL BY RT-PCR (FLU A&B, COVID) ARPGX2
Influenza A by PCR: NEGATIVE
Influenza B by PCR: NEGATIVE
SARS Coronavirus 2 by RT PCR: NEGATIVE

## 2020-09-17 MED ORDER — ONDANSETRON HCL 4 MG PO TABS: 4.0000 mg | ORAL_TABLET | Freq: Three times a day (TID) | ORAL | 0 refills | Status: DC | PRN

## 2020-09-17 MED ORDER — SODIUM CHLORIDE 0.9 % IV BOLUS
1000.0000 mL | Freq: Once | INTRAVENOUS | Status: AC
  Administered 2020-09-17: 1000 mL via INTRAVENOUS

## 2020-09-17 NOTE — ED Notes (Signed)
Pt assisted to bathroom at this time, urine sample collected. VS re-checked as well.

## 2020-09-17 NOTE — ED Provider Notes (Signed)
[facility] Emergency Department Provider Note  ____________________________________________   I have reviewed the triage vital signs and the nursing notes.   HISTORY  Chief Complaint Dehydration   History limited by: Not Limited   HPI Tammy Turner is a 60 y.o. female who presents to the emergency department today with primary concern for dehydration.  The patient states that she has been having nausea, vomiting and diarrhea.  This started yesterday.  Started shortly after she ate a meal.  She is not sure if she has food poisoning.  She also is concerned she potentially has COVID given recent exposure at work.  Patient also had some slight abdominal discomfort with her symptoms.  She states she has been trying to orally hydrate but does not think she is able to keep up.  She states that she gets dehydrated very quickly.  She has been experiencing some leg cramping.  Did have a fever of 101 this morning although that resolved without any medication.  She denies any shortness of breath or chest pain.   Records reviewed. Per medical record review patient has a history of HTN  Past Medical History:  Diagnosis Date   Allergic state    Anemia    Atypical chest pain    a. 0/2015 Myoview: EF 64%, no ischemia.   Breast cancer of upper-outer quadrant of right female breast ([place]) 06/2016   ER 95%, PR 90%, HER-2/neu not overexpressed.   Complication of anesthesia    SEVERE MUSCLE ACHES FROM DIAPHRAGM TO THIGHS X 2 DAYS AFTER PARTIAL MASTECTOMY ON 07-25-16-PT WAS GIVEN SUCCINYLCHOLINE FOR INDUCTION   Dyspnea on exertion    a. 09/2013 Echo: EF 60-65%, no rwma;  b. 11/2013 Myoview: EF 64%, no ischemia; c. 09/2016 Echo: EF 55-60%, no rwma; d. 12/2019 Echo: EF 60-65%, mod asymm LVH, nl RV fxn, mildly dil LA.    Family history of ovarian cancer    Pt is My Risk cancer genetic testing neg 2016   Genetic testing 2016   My Risk negative   Gross hematuria    Headache(784.0)     Hyperlipidemia    Hypertension    Hypothyroidism    Migraines    Morbid obesity ([place])    Osteopenia    Osteopenia    Palpitations    a. 01/2020 Zio: Avg HR 83 (47-129), rare PACs/PVCs. Six atrial runs up to 18 beats (179bpm). NSVT x 2 (up to 5 beats - 200bpm). No prolonged arrhythmias or pauses. Triggered events = sinus rhythm, w/ some PACs/PVCs.   Pernicious anemia    Personal history of radiation therapy    Sleep apnea 2010   uses CPAP   Syncope    a. 09/2016 - seen in ED - limited w/u unrevealing; b. 09/2016 Echo: EF 55-60%, no rwma;  b. 10/2016 Event monitor:  no signficant arrhythmias. Occas pac's.    Patient Active Problem List   Diagnosis Date Noted   Chest pain of uncertain etiology 11/94/1740   Palpitations 12/05/2019   History of breast cancer 10/24/2018   Sepsis ([place]) 11/15/2016   Cellulitis 11/15/2016   Heart murmur 09/21/2016   Syncope 09/19/2016   Seroma of breast 08/17/2016   Malignant neoplasm of upper-outer quadrant of right breast in female, estrogen receptor positive ([place]) 07/12/2016   Essential hypertension 01/27/2015   Sleep apnea 01/27/2015   Hypothyroidism 01/27/2015   Migraine, unspecified, not intractable, without status migrainosus 01/27/2015   Osteopenia 01/27/2015   Gross hematuria 02/15/2014   Morbid obesity (  [place]) 11/12/2013   Dyspnea on exertion 09/13/2013   Anemia, unspecified 07/08/2013   Benign essential hypertension 07/08/2013   Headache(784.0) 07/08/2013   Hyperlipidemia, unspecified 07/08/2013   Pernicious anemia 12/08/2008    Past Surgical History:  Procedure Laterality Date   ABDOMINAL HYSTERECTOMY  2011   [place]; Dr. Laurey Morale, due to leio/adenomyosis   BREAST EXCISIONAL BIOPSY Right 07/25/2016   lumpectomy radation reexcison + anterior margins 08-08-16   BREAST LUMPECTOMY Right 08/08/2016   Procedure: BREAST RE-EXCISION;  Surgeon: Robert Bellow, MD;  Location: [facility] ORS;  Service: General;  Laterality: Right;   CESAREAN SECTION     x  2   CHOLECYSTECTOMY     COLONOSCOPY     COLONOSCOPY WITH PROPOFOL N/A 10/05/2015   Procedure: COLONOSCOPY WITH PROPOFOL;  Surgeon: Manya Silvas, MD;  Location: [facility] ENDOSCOPY;  Service: Endoscopy;  Laterality: N/A;   COLONOSCOPY WITH PROPOFOL N/A 08/21/2020   Procedure: COLONOSCOPY WITH PROPOFOL;  Surgeon: Robert Bellow, MD;  Location: [facility] ENDOSCOPY;  Service: Endoscopy;  Laterality: N/A;   DILATION AND CURETTAGE OF UTERUS     FUNCTIONAL ENDOSCOPIC SINUS SURGERY     PARTIAL MASTECTOMY WITH AXILLARY SENTINEL LYMPH NODE BIOPSY Right 07/25/2016   Procedure: PARTIAL MASTECTOMY WITH AXILLARY SENTINEL LYMPH NODE BIOPSY;  Surgeon: Robert Bellow, MD;  Location: [facility] ORS;  Service: General;  Laterality: Right;    Prior to Admission medications   Medication Sig Start Date End Date Taking? Authorizing Provider  Artificial Tear Ointment (DRY EYES OP) Apply to eye at bedtime.    [provider]  ASCORBIC ACID PO Take 2 tablets by mouth every evening.    [provider]  CALCIUM PO Take by mouth. Takes 2-3 times per week    [provider]  Carboxymethylcellulose Sodium (THERATEARS OP) Place 1 drop into both eyes 2 (two) times daily as needed (dry eyes).    [provider]  cholecalciferol (VITAMIN D) 1000 units tablet Take 1,000 Units by mouth every evening.    [provider]  Cyanocobalamin (VITAMIN B-12 IJ) Inject 1,000 mcg as directed every 30 (thirty) days.    [provider]  dimenhyDRINATE (DRAMAMINE) 50 MG tablet Take 50 mg by mouth every 8 (eight) hours as needed.    [provider]  diphenhydrAMINE (BENADRYL) 25 mg capsule Take 25 mg by mouth once as needed.    [provider]  EPINEPHrine 0.3 mg/0.3 mL IJ SOAJ injection INJECT INTRAMUSCULARLY AS DIRECTED 08/08/16   [provider]  exemestane (AROMASIN) 25 MG tablet Take 1 tablet (25 mg total) by mouth daily after breakfast. 05/17/17   Byrnett, Forest Gleason,  MD  losartan (COZAAR) 100 MG tablet Take 100 mg by mouth daily. 01/23/18   [provider]  MAGNESIUM PO Takes 2-3 times per week    [provider]  potassium chloride SA (KLOR-CON) 20 MEQ tablet Take 20 mEq by mouth daily.    [provider]  thyroid (ARMOUR) 120 MG tablet Take 120 mg by mouth daily before breakfast.    [provider]  [place] occassionally    [provider]    Allergies Morphine, Niacin, Septra [sulfamethoxazole-trimethoprim], Bisphosphonates, Doxycycline, Iodine, Ivp dye [iodinated diagnostic agents], Levaquin [levofloxacin in d5w], Cephalexin, Penicillins, and Sulfa antibiotics  Family History  Problem Relation Age of Onset   Pulmonary embolism Mother    Hypertension Mother    Hyperlipidemia Mother    Hypertension Father    Colon cancer Father  68   Ovarian cancer Maternal Grandmother        58   Colon cancer Paternal Grandmother        37   Colon cancer Maternal Grandfather 85   Fainting Paternal Grandfather    Breast cancer Neg Hx     Social History Social History   Tobacco Use   Smoking status: Never   Smokeless tobacco: Never  Vaping Use   Vaping Use: Never used  Substance Use Topics   Alcohol use: No   Drug use: No    Review of Systems Constitutional: No fever/chills Eyes: No visual changes. ENT: No sore throat. Cardiovascular: Denies chest pain. Respiratory: Denies shortness of breath. Gastrointestinal: Nausea, vomiting and diarrhea.  Genitourinary: Negative for dysuria. Musculoskeletal: Leg cramps. Skin: Negative for rash. Neurological: Negative for headaches, focal weakness or numbness.  ____________________________________________   PHYSICAL EXAM:  VITAL SIGNS: ED Triage Vitals  Enc Vitals Group     BP 09/17/20 1621 (!) 157/77     Pulse Rate 09/17/20 1621 87     Resp 09/17/20 1621 18     Temp 09/17/20 1621 98.8 F (37.1 C)     Temp Source 09/17/20 1621 Oral      SpO2 09/17/20 1621 96 %     Weight 09/17/20 1618 225 lb (102.1 kg)     Height 09/17/20 1618 5' 1"  (1.549 m)     Head Circumference --      Peak Flow --      Pain Score 09/17/20 1618 0   Constitutional: Alert and oriented.  Eyes: Conjunctivae are normal.  ENT      Head: Normocephalic and atraumatic.      Nose: No congestion/rhinnorhea.      Mouth/Throat: Mucous membranes are moist.      Neck: No stridor. Hematological/Lymphatic/Immunilogical: No cervical lymphadenopathy. Cardiovascular: Normal rate, regular rhythm.  No murmurs, rubs, or gallops.  Respiratory: Normal respiratory effort without tachypnea nor retractions. Breath sounds are clear and equal bilaterally. No wheezes/rales/rhonchi. Gastrointestinal: Soft and non tender. No rebound. No guarding.  Genitourinary: Deferred Musculoskeletal: Normal range of motion in all extremities. No lower extremity edema. Neurologic:  Normal speech and language. No gross focal neurologic deficits are appreciated.  Skin:  Skin is warm, dry and intact. No rash noted. Psychiatric: Mood and affect are normal. Speech and behavior are normal. Patient exhibits appropriate insight and judgment.  ____________________________________________    LABS (pertinent positives/negatives)  UA clear, large hgb dipstick, small leukocytes, 11-20 RBC, 11-20 WBC BMP wnl CBC wbc 7.5, hgb 14.6, plt 236 COVID/influenza negative ____________________________________________   EKG  None  ____________________________________________    RADIOLOGY  None  ____________________________________________   PROCEDURES  Procedures  ____________________________________________   INITIAL IMPRESSION / ASSESSMENT AND PLAN / ED COURSE  Pertinent labs & imaging results that were available during my care of the patient were reviewed by me and considered in my medical decision making (see chart for details).   Patient presented to the emergency department today  because of concerns for nausea vomiting diarrhea and possible dehydration.  Possible that this is related to food poisoning since it started shortly after eating lunch.  Patient also had concern for possible COVID although that came back negative.  Blood work without any signs of concerning dehydration, creatinine was normal limits no concerning electrolyte abnormality.  Patient was given IV fluids here and did feel better.  No concerning abdominal pain or tenderness to suggest significant intraabdominal infection. At this time will plan on  discharging home.  ___________________________________________   FINAL CLINICAL IMPRESSION(S) / ED DIAGNOSES  Final diagnoses:  Dehydration  Gastroenteritis     Note: This dictation was prepared with Dragon dictation. Any transcriptional errors that result from this process are unintentional     Nance Pear, MD 09/17/20 2300

## 2020-09-17 NOTE — Discharge Instructions (Addendum)
Please seek medical attention for any high fevers, chest pain, shortness of breath, change in behavior, persistent vomiting, bloody stool or any other new or concerning symptoms.  

## 2020-09-17 NOTE — ED Notes (Signed)
Pt reports to this RN that she feels as though she is "cramping" and normally feels this way when she is dehydrated. MD notified and verbal orders to admin NS bolus. See [facility]

## 2020-09-17 NOTE — ED Triage Notes (Signed)
Pt comes into the ED via POV c/o syncopal episode x 2 and N/V/D since yesterday.  Pt states she thinks she has either food poisoning or COVID since she has had a COVID exposure to someone at work.  Pt currently neurologically intact and in NAD.

## 2020-09-17 NOTE — ED Notes (Addendum)
Pt removed all belongings from ED prior to her departure and had no questions or concerns for this RN prior to discharge. Pt A&Ox4 and stable upon d/c- Pt verbalized understanding of d/c instructions and was unable to E-sign for d/c

## 2020-09-17 NOTE — ED Notes (Signed)
Covid swab sent to lab at this time

## 2020-09-20 LAB — URINE CULTURE

## 2020-09-22 ENCOUNTER — Institutional Professional Consult (permissible substitution): Payer: 59 | Admitting: Plastic Surgery

## 2020-09-23 ENCOUNTER — Other Ambulatory Visit: Payer: Self-pay

## 2020-09-23 ENCOUNTER — Ambulatory Visit (INDEPENDENT_AMBULATORY_CARE_PROVIDER_SITE_OTHER): Payer: 59 | Admitting: Plastic Surgery

## 2020-09-23 ENCOUNTER — Encounter: Payer: Self-pay | Admitting: Plastic Surgery

## 2020-09-23 VITALS — BP 148/67 | HR 80 | Ht 61.0 in | Wt 238.2 lb

## 2020-09-23 DIAGNOSIS — M546 Pain in thoracic spine: Secondary | ICD-10-CM

## 2020-09-23 DIAGNOSIS — N62 Hypertrophy of breast: Secondary | ICD-10-CM

## 2020-09-23 DIAGNOSIS — M545 Low back pain, unspecified: Secondary | ICD-10-CM | POA: Diagnosis not present

## 2020-09-23 DIAGNOSIS — M4004 Postural kyphosis, thoracic region: Secondary | ICD-10-CM | POA: Diagnosis not present

## 2020-09-23 NOTE — Progress Notes (Signed)
Referring Provider Juluis Pitch, MD [address] [place],  [place] [address]   CC:  Chief Complaint  Patient presents with   Advice Only      Tammy Turner is an 60 y.o. female.  HPI: Patient presents to discuss breast reduction.  She had years of back pain, neck pain and shoulder grooving related to her large breasts.  She is tried over-the-counter medications, warm packs, cold packs and supportive bras with little relief.  She is also been to a chiropractor with little sustained relief.  She does have a history of breast cancer and had breast conservation therapy on the right side several years ago.  She is quite asymmetric because of this and would like this to be corrected as well.  She wants to be quite a bit smaller and is currently a D cup.  She is also been to physical therapy for back pain.  Allergies  Allergen Reactions   Morphine Other (See Comments)    Depressed respirations   Niacin Anaphylaxis   Septra [Sulfamethoxazole-Trimethoprim] Other (See Comments)    Fever and joint pain   Bisphosphonates     Jaw pain   Doxycycline Hives   Iodine Hives and Itching   Ivp Dye [Iodinated Diagnostic Agents] Hives and Itching   Levaquin [Levofloxacin In D5w] Other (See Comments)    Tendonitis    Cephalexin Rash   Penicillins Hives and Rash    Has patient had a PCN reaction causing immediate rash, facial/tongue/throat swelling, SOB or lightheadedness with hypotension: No Has patient had a PCN reaction causing severe rash involving mucus membranes or skin necrosis: No Has patient had a PCN reaction that required hospitalization: No Has patient had a PCN reaction occurring within the last 10 years: Yes If all of the above answers are "NO", then may proceed with Cephalosporin use.    Sulfa Antibiotics Rash, Hives and Itching    Outpatient Encounter Medications as of 09/23/2020  Medication Sig   Artificial Tear Ointment (DRY EYES OP) Apply to eye at bedtime.   ASCORBIC ACID PO  Take 2 tablets by mouth every evening.   CALCIUM PO Take by mouth. Takes 2-3 times per week   Carboxymethylcellulose Sodium (THERATEARS OP) Place 1 drop into both eyes 2 (two) times daily as needed (dry eyes).   cholecalciferol (VITAMIN D) 1000 units tablet Take 1,000 Units by mouth every evening.   Cyanocobalamin (VITAMIN B-12 IJ) Inject 1,000 mcg as directed every 30 (thirty) days.   dimenhyDRINATE (DRAMAMINE) 50 MG tablet Take 50 mg by mouth every 8 (eight) hours as needed.   diphenhydrAMINE (BENADRYL) 25 mg capsule Take 25 mg by mouth once as needed.   EPINEPHrine 0.3 mg/0.3 mL IJ SOAJ injection INJECT INTRAMUSCULARLY AS DIRECTED   exemestane (AROMASIN) 25 MG tablet Take 1 tablet (25 mg total) by mouth daily after breakfast.   losartan (COZAAR) 100 MG tablet Take 100 mg by mouth daily.   MAGNESIUM PO Takes 2-3 times per week   ondansetron (ZOFRAN) 4 MG tablet Take 1 tablet (4 mg total) by mouth every 8 (eight) hours as needed.   potassium chloride SA (KLOR-CON) 20 MEQ tablet Take 20 mEq by mouth daily.   thyroid (ARMOUR) 120 MG tablet Take 120 mg by mouth daily before breakfast.   ZINC SULFATE PO Takes occassionally   No facility-administered encounter medications on file as of 09/23/2020.     Past Medical History:  Diagnosis Date   Allergic state    Anemia  Atypical chest pain    a. 0/2015 Myoview: EF 64%, no ischemia.   Breast cancer of upper-outer quadrant of right female breast ([place]) 06/2016   ER 95%, PR 90%, HER-2/neu not overexpressed.   Complication of anesthesia    SEVERE MUSCLE ACHES FROM DIAPHRAGM TO THIGHS X 2 DAYS AFTER PARTIAL MASTECTOMY ON 07-25-16-PT WAS GIVEN SUCCINYLCHOLINE FOR INDUCTION   Dyspnea on exertion    a. 09/2013 Echo: EF 60-65%, no rwma;  b. 11/2013 Myoview: EF 64%, no ischemia; c. 09/2016 Echo: EF 55-60%, no rwma; d. 12/2019 Echo: EF 60-65%, mod asymm LVH, nl RV fxn, mildly dil LA.    Family history of ovarian cancer    Pt is My Risk cancer genetic  testing neg 2016   Genetic testing 2016   My Risk negative   Gross hematuria    Headache(784.0)    Hyperlipidemia    Hypertension    Hypothyroidism    Migraines    Morbid obesity ([place])    Osteopenia    Osteopenia    Palpitations    a. 01/2020 Zio: Avg HR 83 (47-129), rare PACs/PVCs. Six atrial runs up to 18 beats (179bpm). NSVT x 2 (up to 5 beats - 200bpm). No prolonged arrhythmias or pauses. Triggered events = sinus rhythm, w/ some PACs/PVCs.   Pernicious anemia    Personal history of radiation therapy    Sleep apnea 2010   uses CPAP   Syncope    a. 09/2016 - seen in ED - limited w/u unrevealing; b. 09/2016 Echo: EF 55-60%, no rwma;  b. 10/2016 Event monitor:  no signficant arrhythmias. Occas pac's.    Past Surgical History:  Procedure Laterality Date   ABDOMINAL HYSTERECTOMY  2011   [place]; Dr. Laurey Morale, due to leio/adenomyosis   BREAST EXCISIONAL BIOPSY Right 07/25/2016   lumpectomy radation reexcison + anterior margins 08-08-16   BREAST LUMPECTOMY Right 08/08/2016   Procedure: BREAST RE-EXCISION;  Surgeon: Robert Bellow, MD;  Location: [facility] ORS;  Service: General;  Laterality: Right;   CESAREAN SECTION     x 2   CHOLECYSTECTOMY     COLONOSCOPY     COLONOSCOPY WITH PROPOFOL N/A 10/05/2015   Procedure: COLONOSCOPY WITH PROPOFOL;  Surgeon: Manya Silvas, MD;  Location: [facility] ENDOSCOPY;  Service: Endoscopy;  Laterality: N/A;   COLONOSCOPY WITH PROPOFOL N/A 08/21/2020   Procedure: COLONOSCOPY WITH PROPOFOL;  Surgeon: Robert Bellow, MD;  Location: [facility] ENDOSCOPY;  Service: Endoscopy;  Laterality: N/A;   DILATION AND CURETTAGE OF UTERUS     FUNCTIONAL ENDOSCOPIC SINUS SURGERY     PARTIAL MASTECTOMY WITH AXILLARY SENTINEL LYMPH NODE BIOPSY Right 07/25/2016   Procedure: PARTIAL MASTECTOMY WITH AXILLARY SENTINEL LYMPH NODE BIOPSY;  Surgeon: Robert Bellow, MD;  Location: [facility] ORS;  Service: General;  Laterality: Right;    Family History  Problem Relation Age of Onset    Pulmonary embolism Mother    Hypertension Mother    Hyperlipidemia Mother    Hypertension Father    Colon cancer Father 23   Ovarian cancer Maternal Grandmother        39   Colon cancer Paternal Grandmother        38   Colon cancer Maternal Grandfather 16   Fainting Paternal Grandfather    Breast cancer Neg Hx     Social History   Social History Narrative   Not on file  Denies tobacco products  Review of Systems General: Denies fevers, chills, weight loss CV: Denies chest pain, shortness  of breath, palpitations  Physical Exam Vitals with BMI 09/23/2020 09/17/2020 09/17/2020  Height 5' 1"  - -  Weight 238 lbs 3 oz - -  BMI 46.04 - -  Systolic 799 872 158  Diastolic 67 59 73  Pulse 80 78 85    General:  No acute distress,  Alert and oriented, Non-Toxic, Normal speech and affect Breast: She has grade 3 ptosis.  Sternal notch to nipple is 36 cm on the right and 40 cm on the left.  Nipple to fold is 19 cm bilaterally.  She has a scar in the upper outer quadrant of the right breast from her lumpectomies.  She does have some slight firmness to the skin on the right side from the radiation changes.  Assessment/Plan The patient has bilateral symptomatic macromastia.  She is a good candidate for a breast reduction.  She is interested in pursuing surgical treatment.  She has tried supportive garments and fitted bras with no relief.  The details of breast reduction surgery were discussed.  I explained the procedure in detail along the with the expected scars.  The risks were discussed in detail and include bleeding, infection, damage to surrounding structures, need for additional procedures, nipple loss, change in nipple sensation, persistent pain, contour irregularities and asymmetries.  I explained that breast feeding is often not possible after breast reduction surgery.  We discussed the expected postoperative course with an overall recovery period of about 1 month.  She demonstrated full  understanding of all risks.  We discussed her personal risk factors that include history of radiation.  She also wants to be very small is certainly smaller than a C cup.  In light of this we discussed free nipple graft.  She would prefer the free nipple graft technique because it would allow me to make her even smaller and increase the chances of limiting her back pain.  I explained the downside to the free nipple graft would be an insensate nipple and some ultimate pigment irregularities in the nipple graft.  I also explained that it can take a while for the nipple grafts to heal.  She is fully understanding and accepting of these trade-offs and wants to have a free nipple graft..  The patient is interested in pursuing surgical treatment.  I anticipate approximately 825g of tissue removed from each side.   Cindra Presume 09/23/2020, 2:25 PM

## 2020-10-26 ENCOUNTER — Other Ambulatory Visit: Payer: Self-pay | Admitting: General Surgery

## 2020-10-26 DIAGNOSIS — Z79811 Long term (current) use of aromatase inhibitors: Secondary | ICD-10-CM

## 2020-10-26 DIAGNOSIS — N641 Fat necrosis of breast: Secondary | ICD-10-CM

## 2020-10-26 DIAGNOSIS — Z170 Estrogen receptor positive status [ER+]: Secondary | ICD-10-CM

## 2020-10-26 DIAGNOSIS — C50411 Malignant neoplasm of upper-outer quadrant of right female breast: Secondary | ICD-10-CM

## 2020-11-12 ENCOUNTER — Ambulatory Visit (INDEPENDENT_AMBULATORY_CARE_PROVIDER_SITE_OTHER): Payer: 59 | Admitting: Surgical

## 2020-11-12 ENCOUNTER — Encounter: Payer: Self-pay | Admitting: Surgical

## 2020-11-12 ENCOUNTER — Other Ambulatory Visit: Payer: Self-pay

## 2020-11-12 ENCOUNTER — Telehealth: Payer: Self-pay | Admitting: Internal Medicine

## 2020-11-12 VITALS — BP 170/103 | HR 82 | Ht 61.0 in | Wt 253.4 lb

## 2020-11-12 DIAGNOSIS — N62 Hypertrophy of breast: Secondary | ICD-10-CM

## 2020-11-12 DIAGNOSIS — M545 Low back pain, unspecified: Secondary | ICD-10-CM

## 2020-11-12 DIAGNOSIS — M546 Pain in thoracic spine: Secondary | ICD-10-CM

## 2020-11-12 DIAGNOSIS — M4004 Postural kyphosis, thoracic region: Secondary | ICD-10-CM

## 2020-11-12 NOTE — Progress Notes (Signed)
Patient ID: Tammy Turner, female    DOB: May 14, 1960, 60 y.o.   MRN: [id number]  Chief Complaint  Patient presents with   Pre-op Exam      ICD-10-CM   1. Macromastia  N62     2. Back pain of thoracolumbar region  M54.50    M54.6     3. Postural kyphosis, thoracic region  M40.04       History of Present Illness: Tammy Turner is a 60 y.o.  female  with a history of macromastia.  She presents for preoperative evaluation for upcoming procedure, Bilateral Breast Reduction via free nipple graft, scheduled for 12/08/2020 with Dr.  Claudia Desanctis  The patient has had problems with anesthesia.  Reports postoperative nausea and significant muscle aches with use of succinylcholine.  No history of DVT/PE.  No family history of DVT/PE.  No family or personal history of bleeding or clotting disorders.  Patient is not currently taking any blood thinners.  No history of CVA/MI.   Summary of Previous Visit: She has a history of breast cancer and had breast conservation therapy on the right side several years ago.  She is quite asymmetric because of this.  **She has a history of radiation to the right breast**  Estimated excess breast tissue to be removed at time of surgery: 825 grams  Job: HR director  PMH Significant for: Vitamin D deficiency, anemia, hyperlipidemia, hypertension, sleep apnea on CPAP.  She reports she is feeling well lately, no recent fevers, chills, nausea, vomiting, chest pain or shortness of breath.  She reports no problems with shortness of breath on exertion.  She is able to ambulate up and down stairs without any issues.   Past Medical History: Allergies: Allergies  Allergen Reactions   Morphine Other (See Comments)    Depressed respirations   Niacin Anaphylaxis   Septra [Sulfamethoxazole-Trimethoprim] Other (See Comments)    Fever and joint pain   Bisphosphonates     Jaw pain   Doxycycline Hives   Iodine Hives and Itching   Ivp Dye [Iodinated Diagnostic Agents]  Hives and Itching   Levaquin [Levofloxacin In D5w] Other (See Comments)    Tendonitis    Cephalexin Rash   Penicillins Hives and Rash    Has patient had a PCN reaction causing immediate rash, facial/tongue/throat swelling, SOB or lightheadedness with hypotension: No Has patient had a PCN reaction causing severe rash involving mucus membranes or skin necrosis: No Has patient had a PCN reaction that required hospitalization: No Has patient had a PCN reaction occurring within the last 10 years: Yes If all of the above answers are "NO", then may proceed with Cephalosporin use.    Sulfa Antibiotics Rash, Hives and Itching    Current Medications:  Current Outpatient Medications:    Artificial Tear Ointment (DRY EYES OP), Apply to eye at bedtime., Disp: , Rfl:    ASCORBIC ACID PO, Take 2 tablets by mouth every evening., Disp: , Rfl:    CALCIUM PO, Take by mouth. Takes 2-3 times per week, Disp: , Rfl:    Carboxymethylcellulose Sodium (THERATEARS OP), Place 1 drop into both eyes 2 (two) times daily as needed (dry eyes)., Disp: , Rfl:    cholecalciferol (VITAMIN D) 1000 units tablet, Take 1,000 Units by mouth every evening., Disp: , Rfl:    Cyanocobalamin (VITAMIN B-12 IJ), Inject 1,000 mcg as directed every 30 (thirty) days., Disp: , Rfl:    dimenhyDRINATE (DRAMAMINE) 50 MG tablet, Take 50 mg by mouth  every 8 (eight) hours as needed., Disp: , Rfl:    diphenhydrAMINE (BENADRYL) 25 mg capsule, Take 25 mg by mouth once as needed., Disp: , Rfl:    EPINEPHrine 0.3 mg/0.3 mL IJ SOAJ injection, INJECT INTRAMUSCULARLY AS DIRECTED, Disp: , Rfl:    exemestane (AROMASIN) 25 MG tablet, Take 1 tablet (25 mg total) by mouth daily after breakfast., Disp: 90 tablet, Rfl: 4   losartan (COZAAR) 100 MG tablet, Take 100 mg by mouth daily., Disp: , Rfl:    MAGNESIUM PO, Takes 2-3 times per week, Disp: , Rfl:    ondansetron (ZOFRAN) 4 MG tablet, Take 1 tablet (4 mg total) by mouth every 8 (eight) hours as needed.,  Disp: 20 tablet, Rfl: 0   potassium chloride SA (KLOR-CON) 20 MEQ tablet, Take 20 mEq by mouth daily., Disp: , Rfl:    thyroid (ARMOUR) 120 MG tablet, Take 120 mg by mouth daily before breakfast., Disp: , Rfl:    ZINC SULFATE PO, Takes occassionally, Disp: , Rfl:   Past Medical Problems: Past Medical History:  Diagnosis Date   Allergic state    Anemia    Atypical chest pain    a. 0/2015 Myoview: EF 64%, no ischemia.   Breast cancer of upper-outer quadrant of right female breast ([place]) 06/2016   ER 95%, PR 90%, HER-2/neu not overexpressed.   Complication of anesthesia    SEVERE MUSCLE ACHES FROM DIAPHRAGM TO THIGHS X 2 DAYS AFTER PARTIAL MASTECTOMY ON 07-25-16-PT WAS GIVEN SUCCINYLCHOLINE FOR INDUCTION   Dyspnea on exertion    a. 09/2013 Echo: EF 60-65%, no rwma;  b. 11/2013 Myoview: EF 64%, no ischemia; c. 09/2016 Echo: EF 55-60%, no rwma; d. 12/2019 Echo: EF 60-65%, mod asymm LVH, nl RV fxn, mildly dil LA.    Family history of ovarian cancer    Pt is My Risk cancer genetic testing neg 2016   Genetic testing 2016   My Risk negative   Gross hematuria    Headache(784.0)    Hyperlipidemia    Hypertension    Hypothyroidism    Migraines    Morbid obesity ([place])    Osteopenia    Osteopenia    Palpitations    a. 01/2020 Zio: Avg HR 83 (47-129), rare PACs/PVCs. Six atrial runs up to 18 beats (179bpm). NSVT x 2 (up to 5 beats - 200bpm). No prolonged arrhythmias or pauses. Triggered events = sinus rhythm, w/ some PACs/PVCs.   Pernicious anemia    Personal history of radiation therapy    Sleep apnea 2010   uses CPAP   Syncope    a. 09/2016 - seen in ED - limited w/u unrevealing; b. 09/2016 Echo: EF 55-60%, no rwma;  b. 10/2016 Event monitor:  no signficant arrhythmias. Occas pac's.    Past Surgical History: Past Surgical History:  Procedure Laterality Date   ABDOMINAL HYSTERECTOMY  2011   [place]; Dr. Laurey Morale, due to leio/adenomyosis   BREAST EXCISIONAL BIOPSY Right 07/25/2016   lumpectomy  radation reexcison + anterior margins 08-08-16   BREAST LUMPECTOMY Right 08/08/2016   Procedure: BREAST RE-EXCISION;  Surgeon: Robert Bellow, MD;  Location: [facility] ORS;  Service: General;  Laterality: Right;   CESAREAN SECTION     x 2   CHOLECYSTECTOMY     COLONOSCOPY     COLONOSCOPY WITH PROPOFOL N/A 10/05/2015   Procedure: COLONOSCOPY WITH PROPOFOL;  Surgeon: Manya Silvas, MD;  Location: [facility] ENDOSCOPY;  Service: Endoscopy;  Laterality: N/A;   COLONOSCOPY WITH PROPOFOL N/A 08/21/2020  Procedure: COLONOSCOPY WITH PROPOFOL;  Surgeon: Robert Bellow, MD;  Location: [facility] ENDOSCOPY;  Service: Endoscopy;  Laterality: N/A;   DILATION AND CURETTAGE OF UTERUS     FUNCTIONAL ENDOSCOPIC SINUS SURGERY     PARTIAL MASTECTOMY WITH AXILLARY SENTINEL LYMPH NODE BIOPSY Right 07/25/2016   Procedure: PARTIAL MASTECTOMY WITH AXILLARY SENTINEL LYMPH NODE BIOPSY;  Surgeon: Robert Bellow, MD;  Location: [facility] ORS;  Service: General;  Laterality: Right;    Social History: Social History   Socioeconomic History   Marital status: Married    Spouse name: Not on file   Number of children: Not on file   Years of education: Not on file   Highest education level: Not on file  Occupational History   Not on file  Tobacco Use   Smoking status: Never   Smokeless tobacco: Never  Vaping Use   Vaping Use: Never used  Substance and Sexual Activity   Alcohol use: No   Drug use: No   Sexual activity: Yes    Birth control/protection: Surgical    Comment: Hysterectomy  Other Topics Concern   Not on file  Social History Narrative   Not on file   Social Determinants of Health   Financial Resource Strain: Not on file  Food Insecurity: Not on file  Transportation Needs: Not on file  Physical Activity: Not on file  Stress: Not on file  Social Connections: Not on file  Intimate Partner Violence: Not on file    Family History: Family History  Problem Relation Age of Onset   Pulmonary embolism  Mother    Hypertension Mother    Hyperlipidemia Mother    Hypertension Father    Colon cancer Father 76   Ovarian cancer Maternal Grandmother        62   Colon cancer Paternal Grandmother        58   Colon cancer Maternal Grandfather 48   Fainting Paternal Grandfather    Breast cancer Neg Hx     Review of Systems: Review of Systems  Constitutional: Negative.   Respiratory: Negative.    Cardiovascular: Negative.   Gastrointestinal: Negative.   Neurological: Negative.    Physical Exam: Vital Signs BP (!) 170/103 (BP Location: Left Arm, Patient Position: Sitting, Cuff Size: Large)   Pulse 82   Ht 5' 1"  (1.549 m)   Wt 253 lb 6.4 oz (114.9 kg)   SpO2 98%   BMI 47.88 kg/m   Physical Exam  Constitutional:      General: Not in acute distress.    Appearance: Normal appearance. Not ill-appearing.  HENT:     Head: Normocephalic and atraumatic.  Eyes:     Pupils: Pupils are equal, round Neck:     Musculoskeletal: Normal range of motion.  Cardiovascular:     Rate and Rhythm: Normal rate    Pulses: Normal pulses.  Pulmonary:     Effort: Pulmonary effort is normal. No respiratory distress.  Musculoskeletal: Normal range of motion.  Skin:    General: Skin is warm and dry.     Findings: No erythema or rash.  Neurological:     General: No focal deficit present.     Mental Status: Alert and oriented to person, place, and time. Mental status is at baseline.     Motor: No weakness.  Psychiatric:        Mood and Affect: Mood normal.        Behavior: Behavior normal.    Assessment/Plan: The patient is  scheduled for bilateral breast reduction via free nipple graft with Dr. Claudia Desanctis.  Risks, benefits, and alternatives of procedure discussed, questions answered and consent obtained.    Smoking Status: Non-smoker; Counseling Given?  N/A Last Mammogram: Scheduled for mammogram October 25; Results: Has an appointment scheduled with Dr. Bary Castilla with general surgery to discuss mammogram  results  Caprini Score: 7, high; Risk Factors include: Age, BMI greater than 40, history of right breast cancer and length of planned surgery. Recommendation for mechanical prophylaxis. Encourage early ambulation.   Pictures obtained: @consult   Post-op Rx sent to pharmacy:  Zofran, patient denied narcotic prescription.  Patient was provided with the breast reduction and General Surgical Risk consent document and Pain Medication Agreement prior to their appointment.  They had adequate time to read through the risk consent documents and Pain Medication Agreement. We also discussed them in person together during this preop appointment. All of their questions were answered to their satisfaction.  Recommended calling if they have any further questions.  Risk consent form and Pain Medication Agreement to be scanned into patient's chart.  The risk that can be encountered with breast reduction were discussed and include the following but not limited to these:  Breast asymmetry, fluid accumulation, firmness of the breast, inability to breast feed, loss of nipple or areola, skin loss, decrease or no nipple sensation, fat necrosis of the breast tissue, bleeding, infection, healing delay.  There are risks of anesthesia, changes to skin sensation and injury to nerves or blood vessels.  The muscle can be temporarily or permanently injured.  You may have an allergic reaction to tape, suture, glue, blood products which can result in skin discoloration, swelling, pain, skin lesions, poor healing.  Any of these can lead to the need for revisonal surgery or stage procedures.  A reduction has potential to interfere with diagnostic procedures.  Nipple or breast piercing can increase risks of infection.  This procedure is best done when the breast is fully developed.  Changes in the breast will continue to occur over time.  Pregnancy can alter the outcomes of previous breast reduction surgery, weight gain and weigh loss can  also effect the long term appearance.   Patient is aware of increased postoperative complications given history of radiation to the right breast.  We will send cardiac clearance to [facility] heart care  Electronically signed by: Carola Rhine Emaan Gary, PA-C 11/12/2020 3:09 PM

## 2020-11-12 NOTE — H&P (View-Only) (Signed)
Patient ID: Tammy Turner, female    DOB: 1960/06/17, 60 y.o.   MRN: [id number]  Chief Complaint  Patient presents with   Pre-op Exam      ICD-10-CM   1. Macromastia  N62     2. Back pain of thoracolumbar region  M54.50    M54.6     3. Postural kyphosis, thoracic region  M40.04       History of Present Illness: Tammy Turner is a 60 y.o.  female  with a history of macromastia.  She presents for preoperative evaluation for upcoming procedure, Bilateral Breast Reduction via free nipple graft, scheduled for 12/08/2020 with Dr.  Claudia Desanctis  The patient has had problems with anesthesia.  Reports postoperative nausea and significant muscle aches with use of succinylcholine.  No history of DVT/PE.  No family history of DVT/PE.  No family or personal history of bleeding or clotting disorders.  Patient is not currently taking any blood thinners.  No history of CVA/MI.   Summary of Previous Visit: She has a history of breast cancer and had breast conservation therapy on the right side several years ago.  She is quite asymmetric because of this.  **She has a history of radiation to the right breast**  Estimated excess breast tissue to be removed at time of surgery: 825 grams  Job: HR director  PMH Significant for: Vitamin D deficiency, anemia, hyperlipidemia, hypertension, sleep apnea on CPAP.  She reports she is feeling well lately, no recent fevers, chills, nausea, vomiting, chest pain or shortness of breath.  She reports no problems with shortness of breath on exertion.  She is able to ambulate up and down stairs without any issues.   Past Medical History: Allergies: Allergies  Allergen Reactions   Morphine Other (See Comments)    Depressed respirations   Niacin Anaphylaxis   Septra [Sulfamethoxazole-Trimethoprim] Other (See Comments)    Fever and joint pain   Bisphosphonates     Jaw pain   Doxycycline Hives   Iodine Hives and Itching   Ivp Dye [Iodinated Diagnostic Agents]  Hives and Itching   Levaquin [Levofloxacin In D5w] Other (See Comments)    Tendonitis    Cephalexin Rash   Penicillins Hives and Rash    Has patient had a PCN reaction causing immediate rash, facial/tongue/throat swelling, SOB or lightheadedness with hypotension: No Has patient had a PCN reaction causing severe rash involving mucus membranes or skin necrosis: No Has patient had a PCN reaction that required hospitalization: No Has patient had a PCN reaction occurring within the last 10 years: Yes If all of the above answers are "NO", then may proceed with Cephalosporin use.    Sulfa Antibiotics Rash, Hives and Itching    Current Medications:  Current Outpatient Medications:    Artificial Tear Ointment (DRY EYES OP), Apply to eye at bedtime., Disp: , Rfl:    ASCORBIC ACID PO, Take 2 tablets by mouth every evening., Disp: , Rfl:    CALCIUM PO, Take by mouth. Takes 2-3 times per week, Disp: , Rfl:    Carboxymethylcellulose Sodium (THERATEARS OP), Place 1 drop into both eyes 2 (two) times daily as needed (dry eyes)., Disp: , Rfl:    cholecalciferol (VITAMIN D) 1000 units tablet, Take 1,000 Units by mouth every evening., Disp: , Rfl:    Cyanocobalamin (VITAMIN B-12 IJ), Inject 1,000 mcg as directed every 30 (thirty) days., Disp: , Rfl:    dimenhyDRINATE (DRAMAMINE) 50 MG tablet, Take 50 mg by mouth  every 8 (eight) hours as needed., Disp: , Rfl:    diphenhydrAMINE (BENADRYL) 25 mg capsule, Take 25 mg by mouth once as needed., Disp: , Rfl:    EPINEPHrine 0.3 mg/0.3 mL IJ SOAJ injection, INJECT INTRAMUSCULARLY AS DIRECTED, Disp: , Rfl:    exemestane (AROMASIN) 25 MG tablet, Take 1 tablet (25 mg total) by mouth daily after breakfast., Disp: 90 tablet, Rfl: 4   losartan (COZAAR) 100 MG tablet, Take 100 mg by mouth daily., Disp: , Rfl:    MAGNESIUM PO, Takes 2-3 times per week, Disp: , Rfl:    ondansetron (ZOFRAN) 4 MG tablet, Take 1 tablet (4 mg total) by mouth every 8 (eight) hours as needed.,  Disp: 20 tablet, Rfl: 0   potassium chloride SA (KLOR-CON) 20 MEQ tablet, Take 20 mEq by mouth daily., Disp: , Rfl:    thyroid (ARMOUR) 120 MG tablet, Take 120 mg by mouth daily before breakfast., Disp: , Rfl:    ZINC SULFATE PO, Takes occassionally, Disp: , Rfl:   Past Medical Problems: Past Medical History:  Diagnosis Date   Allergic state    Anemia    Atypical chest pain    a. 0/2015 Myoview: EF 64%, no ischemia.   Breast cancer of upper-outer quadrant of right female breast ([place]) 06/2016   ER 95%, PR 90%, HER-2/neu not overexpressed.   Complication of anesthesia    SEVERE MUSCLE ACHES FROM DIAPHRAGM TO THIGHS X 2 DAYS AFTER PARTIAL MASTECTOMY ON 07-25-16-PT WAS GIVEN SUCCINYLCHOLINE FOR INDUCTION   Dyspnea on exertion    a. 09/2013 Echo: EF 60-65%, no rwma;  b. 11/2013 Myoview: EF 64%, no ischemia; c. 09/2016 Echo: EF 55-60%, no rwma; d. 12/2019 Echo: EF 60-65%, mod asymm LVH, nl RV fxn, mildly dil LA.    Family history of ovarian cancer    Pt is My Risk cancer genetic testing neg 2016   Genetic testing 2016   My Risk negative   Gross hematuria    Headache(784.0)    Hyperlipidemia    Hypertension    Hypothyroidism    Migraines    Morbid obesity ([place])    Osteopenia    Osteopenia    Palpitations    a. 01/2020 Zio: Avg HR 83 (47-129), rare PACs/PVCs. Six atrial runs up to 18 beats (179bpm). NSVT x 2 (up to 5 beats - 200bpm). No prolonged arrhythmias or pauses. Triggered events = sinus rhythm, w/ some PACs/PVCs.   Pernicious anemia    Personal history of radiation therapy    Sleep apnea 2010   uses CPAP   Syncope    a. 09/2016 - seen in ED - limited w/u unrevealing; b. 09/2016 Echo: EF 55-60%, no rwma;  b. 10/2016 Event monitor:  no signficant arrhythmias. Occas pac's.    Past Surgical History: Past Surgical History:  Procedure Laterality Date   ABDOMINAL HYSTERECTOMY  2011   [place]; Dr. Laurey Morale, due to leio/adenomyosis   BREAST EXCISIONAL BIOPSY Right 07/25/2016   lumpectomy  radation reexcison + anterior margins 08-08-16   BREAST LUMPECTOMY Right 08/08/2016   Procedure: BREAST RE-EXCISION;  Surgeon: Robert Bellow, MD;  Location: [facility] ORS;  Service: General;  Laterality: Right;   CESAREAN SECTION     x 2   CHOLECYSTECTOMY     COLONOSCOPY     COLONOSCOPY WITH PROPOFOL N/A 10/05/2015   Procedure: COLONOSCOPY WITH PROPOFOL;  Surgeon: Manya Silvas, MD;  Location: [facility] ENDOSCOPY;  Service: Endoscopy;  Laterality: N/A;   COLONOSCOPY WITH PROPOFOL N/A 08/21/2020  Procedure: COLONOSCOPY WITH PROPOFOL;  Surgeon: Robert Bellow, MD;  Location: [facility] ENDOSCOPY;  Service: Endoscopy;  Laterality: N/A;   DILATION AND CURETTAGE OF UTERUS     FUNCTIONAL ENDOSCOPIC SINUS SURGERY     PARTIAL MASTECTOMY WITH AXILLARY SENTINEL LYMPH NODE BIOPSY Right 07/25/2016   Procedure: PARTIAL MASTECTOMY WITH AXILLARY SENTINEL LYMPH NODE BIOPSY;  Surgeon: Robert Bellow, MD;  Location: [facility] ORS;  Service: General;  Laterality: Right;    Social History: Social History   Socioeconomic History   Marital status: Married    Spouse name: Not on file   Number of children: Not on file   Years of education: Not on file   Highest education level: Not on file  Occupational History   Not on file  Tobacco Use   Smoking status: Never   Smokeless tobacco: Never  Vaping Use   Vaping Use: Never used  Substance and Sexual Activity   Alcohol use: No   Drug use: No   Sexual activity: Yes    Birth control/protection: Surgical    Comment: Hysterectomy  Other Topics Concern   Not on file  Social History Narrative   Not on file   Social Determinants of Health   Financial Resource Strain: Not on file  Food Insecurity: Not on file  Transportation Needs: Not on file  Physical Activity: Not on file  Stress: Not on file  Social Connections: Not on file  Intimate Partner Violence: Not on file    Family History: Family History  Problem Relation Age of Onset   Pulmonary embolism  Mother    Hypertension Mother    Hyperlipidemia Mother    Hypertension Father    Colon cancer Father 16   Ovarian cancer Maternal Grandmother        70   Colon cancer Paternal Grandmother        14   Colon cancer Maternal Grandfather 29   Fainting Paternal Grandfather    Breast cancer Neg Hx     Review of Systems: Review of Systems  Constitutional: Negative.   Respiratory: Negative.    Cardiovascular: Negative.   Gastrointestinal: Negative.   Neurological: Negative.    Physical Exam: Vital Signs BP (!) 170/103 (BP Location: Left Arm, Patient Position: Sitting, Cuff Size: Large)   Pulse 82   Ht 5' 1"  (1.549 m)   Wt 253 lb 6.4 oz (114.9 kg)   SpO2 98%   BMI 47.88 kg/m   Physical Exam  Constitutional:      General: Not in acute distress.    Appearance: Normal appearance. Not ill-appearing.  HENT:     Head: Normocephalic and atraumatic.  Eyes:     Pupils: Pupils are equal, round Neck:     Musculoskeletal: Normal range of motion.  Cardiovascular:     Rate and Rhythm: Normal rate    Pulses: Normal pulses.  Pulmonary:     Effort: Pulmonary effort is normal. No respiratory distress.  Musculoskeletal: Normal range of motion.  Skin:    General: Skin is warm and dry.     Findings: No erythema or rash.  Neurological:     General: No focal deficit present.     Mental Status: Alert and oriented to person, place, and time. Mental status is at baseline.     Motor: No weakness.  Psychiatric:        Mood and Affect: Mood normal.        Behavior: Behavior normal.    Assessment/Plan: The patient is  scheduled for bilateral breast reduction via free nipple graft with Dr. Claudia Desanctis.  Risks, benefits, and alternatives of procedure discussed, questions answered and consent obtained.    Smoking Status: Non-smoker; Counseling Given?  N/A Last Mammogram: Scheduled for mammogram October 25; Results: Has an appointment scheduled with Dr. Bary Castilla with general surgery to discuss mammogram  results  Caprini Score: 7, high; Risk Factors include: Age, BMI greater than 40, history of right breast cancer and length of planned surgery. Recommendation for mechanical prophylaxis. Encourage early ambulation.   Pictures obtained: @consult   Post-op Rx sent to pharmacy:  Zofran, patient denied narcotic prescription.  Patient was provided with the breast reduction and General Surgical Risk consent document and Pain Medication Agreement prior to their appointment.  They had adequate time to read through the risk consent documents and Pain Medication Agreement. We also discussed them in person together during this preop appointment. All of their questions were answered to their satisfaction.  Recommended calling if they have any further questions.  Risk consent form and Pain Medication Agreement to be scanned into patient's chart.  The risk that can be encountered with breast reduction were discussed and include the following but not limited to these:  Breast asymmetry, fluid accumulation, firmness of the breast, inability to breast feed, loss of nipple or areola, skin loss, decrease or no nipple sensation, fat necrosis of the breast tissue, bleeding, infection, healing delay.  There are risks of anesthesia, changes to skin sensation and injury to nerves or blood vessels.  The muscle can be temporarily or permanently injured.  You may have an allergic reaction to tape, suture, glue, blood products which can result in skin discoloration, swelling, pain, skin lesions, poor healing.  Any of these can lead to the need for revisonal surgery or stage procedures.  A reduction has potential to interfere with diagnostic procedures.  Nipple or breast piercing can increase risks of infection.  This procedure is best done when the breast is fully developed.  Changes in the breast will continue to occur over time.  Pregnancy can alter the outcomes of previous breast reduction surgery, weight gain and weigh loss can  also effect the long term appearance.   Patient is aware of increased postoperative complications given history of radiation to the right breast.  We will send cardiac clearance to [facility] heart care  Electronically signed by: Carola Rhine Kniyah Khun, PA-C 11/12/2020 3:09 PM

## 2020-11-12 NOTE — Telephone Encounter (Signed)
Pt agreeable to appt tomorrow with Dr. Saunders Revel for pre op clearance. I will forward notes to MD for appt. Will send FYI to surgeon's office pt has appt 11/13/20.

## 2020-11-12 NOTE — Telephone Encounter (Signed)
   Name: Tammy Turner  DOB: 1960-11-03  MRN: [id number]  Primary Cardiologist: Nelva Bush, MD  Chart reviewed as part of pre-operative protocol coverage. Because of Tammy Turner past medical history and time since last visit, she will require a follow-up visit in order to better assess preoperative cardiovascular risk.  Tammy Turner was last seen on 01/2020 by Murray Hodgkins, NP. History reviewed. She has been previously evaluated for atypical CP, shortness of breath, syncope, and palpitations. At last OV in 01/2020 she was going to be undergoing pharmacologic PET/CT for further workup of chest pain. I do not have access to those results at this time. Also has history of Zio with SVT/NSVT, PACs, PVCs. She was also recommended to f/u in 3 months which has not occurred. Therefore would recommend f/u OV in [place] next available for surgical clearance.  Pre-op covering staff: - Please schedule appointment and call patient to inform them. - Please contact requesting surgeon's office via preferred method (i.e, phone, fax) to inform them of need for appointment prior to surgery.  Do not see any antiplatelet/anticoag meds needing to be held per [facility], but can be reviewed at time of OV.  Charlie Pitter, PA-C  11/12/2020, 4:17 PM

## 2020-11-12 NOTE — Telephone Encounter (Signed)
   [facility] Pre-operative Risk Assessment    Patient Name: Avalyn Molino  DOB: 1960-04-18 MRN: [id number]  HEARTCARE STAFF:  - IMPORTANT!!!!!! Under Visit Info/Reason for Call, type in Other and utilize the format Clearance MM/DD/YY or Clearance TBD. Do not use dashes or single digits. - Please review there is not already an duplicate clearance open for this procedure. - If request is for dental extraction, please clarify the # of teeth to be extracted. - If the patient is currently at the dentist's office, call Pre-Op Callback Staff (MA/nurse) to input urgent request.  - If the patient is not currently in the dentist office, please route to the Pre-Op pool.  Request for surgical clearance:  What type of surgery is being performed? Bilateral breast reduction  When is this surgery scheduled? 12/08/20  What type of clearance is required (medical clearance vs. Pharmacy clearance to hold med vs. Both)? both  Are there any medications that need to be held prior to surgery and how long? Not listed, please advise if needed  Practice name and name of physician performing surgery? [facility] - Dr Mingo Amber  What is the office phone number? [phone]   7.   What is the office fax number? [phone]  8.   Anesthesia type (None, local, MAC, general) ? General    Caryl Pina Gerringer 11/12/2020, 4:01 PM  _________________________________________________________________   (provider comments below)

## 2020-11-13 ENCOUNTER — Ambulatory Visit (INDEPENDENT_AMBULATORY_CARE_PROVIDER_SITE_OTHER): Payer: 59 | Admitting: Internal Medicine

## 2020-11-13 ENCOUNTER — Encounter: Payer: Self-pay | Admitting: Internal Medicine

## 2020-11-13 VITALS — BP 140/80 | HR 78 | Ht 61.0 in | Wt 253.0 lb

## 2020-11-13 DIAGNOSIS — I4729 Other ventricular tachycardia: Secondary | ICD-10-CM | POA: Diagnosis not present

## 2020-11-13 DIAGNOSIS — R079 Chest pain, unspecified: Secondary | ICD-10-CM

## 2020-11-13 DIAGNOSIS — Z01810 Encounter for preprocedural cardiovascular examination: Secondary | ICD-10-CM

## 2020-11-13 DIAGNOSIS — I471 Supraventricular tachycardia: Secondary | ICD-10-CM | POA: Diagnosis not present

## 2020-11-13 DIAGNOSIS — I10 Essential (primary) hypertension: Secondary | ICD-10-CM | POA: Diagnosis not present

## 2020-11-13 NOTE — Patient Instructions (Signed)

## 2020-11-13 NOTE — Progress Notes (Signed)
Follow-up Outpatient Visit Date: 11/13/2020  Primary Care Provider: Juluis Pitch, MD [address] [address]  Chief Complaint: Follow-up chest pain and pre-op evaluation  HPI:  Ms. Grieves is a 60 y.o. female with history of hypertension, hyperlipidemia, hypothyroidism, pernicious anemia, morbid obesity, obstructive sleep apnea, and breast cancer, who presents for preoperative cardiovascular risk assessment.  I met her in 11/2019 for evaluation of fatigue and chest pain.  Event monitor, echocardiogram, and myocardial PET/CT stress test were recommended.  Echo was unremarkable other than moderate basal septal hypertrophy of the left ventricle.  Event monitor showed rare PACs and PVCs as well as a few brief episodes of PSVT and NSVT.  She was seen in late December by Ignacia Bayley, NP, at which time she was feeling a bit better.  PET/CT stress test was scheduled for a few days but deferred per the patient's preference.  Today, Ms. Righter reports that she has been feeling well from a heart standpoint.  She has not had any further chest pain since our last visit with [place].  She also denies shortness of breath, palpitations, lightheadedness, and edema.  She is trying to exercise some by taking the stairs and walking ~2 mi/day.  She is able to climb at least 2 flights of stairs without any dyspnea or chest pain.  Her main complaint is of back pain related to breast asymmetry following lumpectomy and radiation.  She is scheduled for mammoplasty next month.  She is also recovering from spontaneous rib fractures that occurred during a recent trip to [place].  --------------------------------------------------------------------------------------------------  Past Medical History:  Diagnosis Date   Allergic state    Anemia    Atypical chest pain    a. 0/2015 Myoview: EF 64%, no ischemia.   Breast cancer of upper-outer quadrant of right female breast ([place]) 06/2016   ER 95%, PR 90%, HER-2/neu  not overexpressed.   Complication of anesthesia    SEVERE MUSCLE ACHES FROM DIAPHRAGM TO THIGHS X 2 DAYS AFTER PARTIAL MASTECTOMY ON 07-25-16-PT WAS GIVEN SUCCINYLCHOLINE FOR INDUCTION   Dyspnea on exertion    a. 09/2013 Echo: EF 60-65%, no rwma;  b. 11/2013 Myoview: EF 64%, no ischemia; c. 09/2016 Echo: EF 55-60%, no rwma; d. 12/2019 Echo: EF 60-65%, mod asymm LVH, nl RV fxn, mildly dil LA.    Family history of ovarian cancer    Pt is My Risk cancer genetic testing neg 2016   Genetic testing 2016   My Risk negative   Gross hematuria    Headache(784.0)    Hyperlipidemia    Hypertension    Hypothyroidism    Migraines    Morbid obesity ([place])    Osteopenia    Osteopenia    Palpitations    a. 01/2020 Zio: Avg HR 83 (47-129), rare PACs/PVCs. Six atrial runs up to 18 beats (179bpm). NSVT x 2 (up to 5 beats - 200bpm). No prolonged arrhythmias or pauses. Triggered events = sinus rhythm, w/ some PACs/PVCs.   Pernicious anemia    Personal history of radiation therapy    Sleep apnea 2010   uses CPAP   Syncope    a. 09/2016 - seen in ED - limited w/u unrevealing; b. 09/2016 Echo: EF 55-60%, no rwma;  b. 10/2016 Event monitor:  no signficant arrhythmias. Occas pac's.   Past Surgical History:  Procedure Laterality Date   ABDOMINAL HYSTERECTOMY  2011   [place]; Dr. Laurey Morale, due to leio/adenomyosis   BREAST EXCISIONAL BIOPSY Right 07/25/2016  lumpectomy radation reexcison + anterior margins 08-08-16   BREAST LUMPECTOMY Right 08/08/2016   Procedure: BREAST RE-EXCISION;  Surgeon: Robert Bellow, MD;  Location: [facility] ORS;  Service: General;  Laterality: Right;   CESAREAN SECTION     x 2   CHOLECYSTECTOMY     COLONOSCOPY     COLONOSCOPY WITH PROPOFOL N/A 10/05/2015   Procedure: COLONOSCOPY WITH PROPOFOL;  Surgeon: Manya Silvas, MD;  Location[facility] ENDOSCOPY;  Service: Endoscopy;  Laterality: N/A;   COLONOSCOPY WITH PROPOFOL N/A 08/21/2020   Procedure: COLONOSCOPY WITH PROPOFOL;  Surgeon: Robert Bellow, MD;  Location: [facility] ENDOSCOPY;  Service: Endoscopy;  Laterality: N/A;   DILATION AND CURETTAGE OF UTERUS     FUNCTIONAL ENDOSCOPIC SINUS SURGERY     PARTIAL MASTECTOMY WITH AXILLARY SENTINEL LYMPH NODE BIOPSY Right 07/25/2016   Procedure: PARTIAL MASTECTOMY WITH AXILLARY SENTINEL LYMPH NODE BIOPSY;  Surgeon: Robert Bellow, MD;  Location: [facility] ORS;  Service: General;  Laterality: Right;    Current Meds  Medication Sig   Artificial Tear Ointment (DRY EYES OP) Apply to eye at bedtime.   ASCORBIC ACID PO Take 2 tablets by mouth every evening.   CALCIUM PO Take by mouth. Takes 2-3 times per week   Carboxymethylcellulose Sodium (THERATEARS OP) Place 1 drop into both eyes 2 (two) times daily as needed (dry eyes).   cholecalciferol (VITAMIN D) 1000 units tablet Take 1,000 Units by mouth every evening.   Cyanocobalamin (VITAMIN B-12 IJ) Inject 1,000 mcg as directed every 30 (thirty) days.   dimenhyDRINATE (DRAMAMINE) 50 MG tablet Take 50 mg by mouth every 8 (eight) hours as needed.   diphenhydrAMINE (BENADRYL) 25 mg capsule Take 25 mg by mouth once as needed.   EPINEPHrine 0.3 mg/0.3 mL IJ SOAJ injection INJECT INTRAMUSCULARLY AS DIRECTED   exemestane (AROMASIN) 25 MG tablet Take 1 tablet (25 mg total) by mouth daily after breakfast.   losartan (COZAAR) 100 MG tablet Take 100 mg by mouth daily.   MAGNESIUM PO Takes 2-3 times per week   ondansetron (ZOFRAN) 4 MG tablet Take 1 tablet (4 mg total) by mouth every 8 (eight) hours as needed.   potassium chloride SA (KLOR-CON) 20 MEQ tablet Take 20 mEq by mouth daily.   thyroid (ARMOUR) 120 MG tablet Take 120 mg by mouth daily before breakfast.   ZINC SULFATE PO Takes occassionally    Allergies: Morphine, Niacin, Septra [sulfamethoxazole-trimethoprim], Bisphosphonates, Doxycycline, Iodine, Ivp dye [iodinated diagnostic agents], Levaquin [levofloxacin in d5w], Cephalexin, Penicillins, and Sulfa antibiotics  Social History   Tobacco Use    Smoking status: Never   Smokeless tobacco: Never  Vaping Use   Vaping Use: Never used  Substance Use Topics   Alcohol use: No   Drug use: No    Family History  Problem Relation Age of Onset   Pulmonary embolism Mother    Hypertension Mother    Hyperlipidemia Mother    Hypertension Father    Colon cancer Father 63   Ovarian cancer Maternal Grandmother        36   Colon cancer Paternal Grandmother        54   Colon cancer Maternal Grandfather 40   Fainting Paternal Grandfather    Breast cancer Neg Hx     Review of Systems: A 12-system review of systems was performed and was negative except as noted in the HPI.  --------------------------------------------------------------------------------------------------  Physical Exam: BP 140/80 (BP Location: Left Arm, Patient Position: Sitting, Cuff Size: Normal)  Pulse 78   Ht 5' 1"  (1.549 m)   Wt 253 lb (114.8 kg)   SpO2 98%   BMI 47.80 kg/m   General:  NAD. Neck: Unable to assess JVP due to body habitus. Lungs: Clear to auscultation bilaterally without wheezes or crackles. Heart: Regular rate and rhythm without murmurs, rubs, or gallops. Abdomen: Soft, nontender, nondistended. Extremities: No lower extremity edema.  EKG:  Normal sinus rhythm without abnormality.  Lab Results  Component Value Date   WBC 7.5 09/17/2020   HGB 14.6 09/17/2020   HCT 42.2 09/17/2020   MCV 90.9 09/17/2020   PLT 236 09/17/2020    Lab Results  Component Value Date   NA 135 09/17/2020   K 3.6 09/17/2020   CL 103 09/17/2020   CO2 25 09/17/2020   BUN 14 09/17/2020   CREATININE 0.56 09/17/2020   GLUCOSE 93 09/17/2020   ALT 30 11/25/2016    --------------------------------------------------------------------------------------------------  ASSESSMENT AND PLAN: Chest pain: No further chest pain or shortness or breath reported since her last visit with [place] in 01/2020.  She did not elect to proceed with PET/CT stress test at [facility], but  given lack of further symptoms and good functional capacity, I think it is reasonable to defer this.  She should continue working on primary prevention, including weight loss through diet and exercise.  NSVT and PSVT: Brief episodes of both noted on prior event monitor.  Given lack of symptoms and no significant structural abnormalities on prior echo, we will defer further workup/intervention at this time.  Morbid obesity: BMI remains > 40.  I encouraged her to continue walking, with goal of further weight loss through diet and exercise.  Hypertension: Blood pressure is borderline today.  Ms. Fernando notes a history of white coat hypertension.  We will defer medication changes and continue working on lifestyle modifications, including weight loss and exercise.  Pre-op evaluation: Ms. Buckels does not have any signs or symptoms of unstable cardiac disease.  She is able to complete >4 METS of activity without limitations.  Her physical exam and EKG today are normal.  I think it is reasonable for her to proceed with planned breast reduction surgery, which is low risk from a cardiac standpoint.  Follow-up: Return to clinic in 6 months.  Nelva Bush, MD 11/13/2020 8:30 AM

## 2020-11-14 ENCOUNTER — Encounter: Payer: Self-pay | Admitting: Internal Medicine

## 2020-11-14 DIAGNOSIS — I4729 Other ventricular tachycardia: Secondary | ICD-10-CM | POA: Insufficient documentation

## 2020-11-14 DIAGNOSIS — I471 Supraventricular tachycardia, unspecified: Secondary | ICD-10-CM | POA: Insufficient documentation

## 2020-11-30 ENCOUNTER — Encounter (HOSPITAL_BASED_OUTPATIENT_CLINIC_OR_DEPARTMENT_OTHER): Payer: Self-pay | Admitting: Plastic Surgery

## 2020-11-30 NOTE — Progress Notes (Signed)
Dr. Sabra Heck reviewed pt's medical history, BMI, and allergy to succinylcholine, causes pt muscle aches and soreness.  Dr. Sabra Heck approved pt to continue to have surgery here at [facility] on 12/08/2020.

## 2020-12-01 ENCOUNTER — Other Ambulatory Visit: Payer: 59

## 2020-12-01 ENCOUNTER — Ambulatory Visit
Admission: RE | Admit: 2020-12-01 | Discharge: 2020-12-01 | Disposition: A | Discharge status: Home / Self Care | Payer: 59 | Source: Ambulatory Visit | Attending: General Surgery | Admitting: General Surgery

## 2020-12-01 ENCOUNTER — Other Ambulatory Visit: Payer: Self-pay

## 2020-12-01 DIAGNOSIS — Z79811 Long term (current) use of aromatase inhibitors: Secondary | ICD-10-CM | POA: Insufficient documentation

## 2020-12-01 DIAGNOSIS — Z923 Personal history of irradiation: Secondary | ICD-10-CM | POA: Diagnosis not present

## 2020-12-01 DIAGNOSIS — M810 Age-related osteoporosis without current pathological fracture: Secondary | ICD-10-CM | POA: Insufficient documentation

## 2020-12-01 DIAGNOSIS — Z170 Estrogen receptor positive status [ER+]: Secondary | ICD-10-CM | POA: Insufficient documentation

## 2020-12-01 DIAGNOSIS — N641 Fat necrosis of breast: Secondary | ICD-10-CM

## 2020-12-01 DIAGNOSIS — C50411 Malignant neoplasm of upper-outer quadrant of right female breast: Secondary | ICD-10-CM

## 2020-12-01 DIAGNOSIS — Z13820 Encounter for screening for osteoporosis: Secondary | ICD-10-CM | POA: Insufficient documentation

## 2020-12-01 DIAGNOSIS — Z853 Personal history of malignant neoplasm of breast: Secondary | ICD-10-CM | POA: Insufficient documentation

## 2020-12-01 DIAGNOSIS — Z780 Asymptomatic menopausal state: Secondary | ICD-10-CM | POA: Insufficient documentation

## 2020-12-03 ENCOUNTER — Other Ambulatory Visit: Payer: Self-pay | Admitting: General Surgery

## 2020-12-03 DIAGNOSIS — R911 Solitary pulmonary nodule: Secondary | ICD-10-CM

## 2020-12-08 ENCOUNTER — Encounter (HOSPITAL_BASED_OUTPATIENT_CLINIC_OR_DEPARTMENT_OTHER)
Admission: RE | Disposition: A | Discharge status: Home / Self Care | Payer: Self-pay | Source: Home / Self Care | Attending: Plastic Surgery

## 2020-12-08 ENCOUNTER — Other Ambulatory Visit: Payer: Self-pay | Admitting: Surgical

## 2020-12-08 ENCOUNTER — Ambulatory Visit (HOSPITAL_BASED_OUTPATIENT_CLINIC_OR_DEPARTMENT_OTHER)
Admission: RE | Admit: 2020-12-08 | Discharge: 2020-12-08 | Disposition: A | Discharge status: Home / Self Care | Payer: 59 | Attending: Plastic Surgery | Admitting: Plastic Surgery

## 2020-12-08 ENCOUNTER — Encounter (HOSPITAL_BASED_OUTPATIENT_CLINIC_OR_DEPARTMENT_OTHER): Payer: Self-pay | Admitting: Plastic Surgery

## 2020-12-08 ENCOUNTER — Other Ambulatory Visit: Payer: Self-pay

## 2020-12-08 ENCOUNTER — Ambulatory Visit (HOSPITAL_BASED_OUTPATIENT_CLINIC_OR_DEPARTMENT_OTHER): Payer: 59 | Admitting: Certified Registered"

## 2020-12-08 DIAGNOSIS — M545 Low back pain, unspecified: Secondary | ICD-10-CM

## 2020-12-08 DIAGNOSIS — N62 Hypertrophy of breast: Secondary | ICD-10-CM

## 2020-12-08 DIAGNOSIS — M546 Pain in thoracic spine: Secondary | ICD-10-CM | POA: Insufficient documentation

## 2020-12-08 DIAGNOSIS — Z853 Personal history of malignant neoplasm of breast: Secondary | ICD-10-CM | POA: Insufficient documentation

## 2020-12-08 DIAGNOSIS — M4004 Postural kyphosis, thoracic region: Secondary | ICD-10-CM | POA: Insufficient documentation

## 2020-12-08 DIAGNOSIS — Z6841 Body Mass Index (BMI) 40.0 and over, adult: Secondary | ICD-10-CM | POA: Insufficient documentation

## 2020-12-08 HISTORY — PX: BREAST REDUCTION SURGERY: SHX8

## 2020-12-08 HISTORY — PX: REDUCTION MAMMAPLASTY: SUR839

## 2020-12-08 SURGERY — MAMMOPLASTY, REDUCTION
Anesthesia: General | Site: Breast | Laterality: Bilateral

## 2020-12-08 MED ORDER — MEPERIDINE HCL 25 MG/ML IJ SOLN: 6.2500 mg | INTRAMUSCULAR | Status: DC | PRN

## 2020-12-08 MED ORDER — CLINDAMYCIN PHOSPHATE 900 MG/50ML IV SOLN
900.0000 mg | INTRAVENOUS | Status: AC
  Administered 2020-12-08: 900 mg via INTRAVENOUS

## 2020-12-08 MED ORDER — DROPERIDOL 2.5 MG/ML IJ SOLN
INTRAMUSCULAR | Status: DC | PRN
  Administered 2020-12-08: .625 mg via INTRAVENOUS

## 2020-12-08 MED ORDER — FENTANYL CITRATE (PF) 100 MCG/2ML IJ SOLN
INTRAMUSCULAR | Status: AC
  Filled 2020-12-08: qty 2

## 2020-12-08 MED ORDER — PROPOFOL 10 MG/ML IV BOLUS
INTRAVENOUS | Status: DC | PRN
  Administered 2020-12-08: 200 mg via INTRAVENOUS

## 2020-12-08 MED ORDER — DEXAMETHASONE SODIUM PHOSPHATE 10 MG/ML IJ SOLN
INTRAMUSCULAR | Status: AC
  Filled 2020-12-08: qty 2

## 2020-12-08 MED ORDER — CLINDAMYCIN PHOSPHATE 900 MG/50ML IV SOLN
INTRAVENOUS | Status: AC
  Filled 2020-12-08: qty 50

## 2020-12-08 MED ORDER — SODIUM CHLORIDE (PF) 0.9 % IJ SOLN
INTRAMUSCULAR | Status: AC
  Filled 2020-12-08: qty 10

## 2020-12-08 MED ORDER — ONDANSETRON HCL 4 MG PO TABS: 4.0000 mg | ORAL_TABLET | Freq: Three times a day (TID) | ORAL | 0 refills | Status: DC | PRN

## 2020-12-08 MED ORDER — EPHEDRINE SULFATE 50 MG/ML IJ SOLN
INTRAMUSCULAR | Status: DC | PRN
  Administered 2020-12-08: 10 mg via INTRAVENOUS
  Administered 2020-12-08 (×2): 5 mg via INTRAVENOUS

## 2020-12-08 MED ORDER — DROPERIDOL 2.5 MG/ML IJ SOLN
INTRAMUSCULAR | Status: AC
  Filled 2020-12-08: qty 2

## 2020-12-08 MED ORDER — ACETAMINOPHEN 10 MG/ML IV SOLN
1000.0000 mg | Freq: Four times a day (QID) | INTRAVENOUS | Status: DC
  Administered 2020-12-08: 1000 mg via INTRAVENOUS

## 2020-12-08 MED ORDER — OXYCODONE HCL 5 MG/5ML PO SOLN: 5.0000 mg | Freq: Once | ORAL | Status: DC | PRN

## 2020-12-08 MED ORDER — LACTATED RINGERS IV SOLN: INTRAVENOUS | Status: DC

## 2020-12-08 MED ORDER — LACTATED RINGERS IV SOLN
INTRAVENOUS | Status: DC | PRN
  Administered 2020-12-08: 2000 mL

## 2020-12-08 MED ORDER — OXYCODONE HCL 5 MG PO TABS: 5.0000 mg | ORAL_TABLET | Freq: Once | ORAL | Status: DC | PRN

## 2020-12-08 MED ORDER — HYDROMORPHONE HCL 1 MG/ML IJ SOLN
INTRAMUSCULAR | Status: AC
  Filled 2020-12-08: qty 0.5

## 2020-12-08 MED ORDER — EPHEDRINE 5 MG/ML INJ
INTRAVENOUS | Status: AC
  Filled 2020-12-08: qty 10

## 2020-12-08 MED ORDER — PROMETHAZINE HCL 25 MG/ML IJ SOLN
INTRAMUSCULAR | Status: AC
  Filled 2020-12-08: qty 1

## 2020-12-08 MED ORDER — HYDROMORPHONE HCL 1 MG/ML IJ SOLN
0.2500 mg | INTRAMUSCULAR | Status: DC | PRN
  Administered 2020-12-08: 0.5 mg via INTRAVENOUS

## 2020-12-08 MED ORDER — PROMETHAZINE HCL 25 MG/ML IJ SOLN
6.2500 mg | INTRAMUSCULAR | Status: DC | PRN
  Administered 2020-12-08: 6.25 mg via INTRAVENOUS

## 2020-12-08 MED ORDER — DEXAMETHASONE SODIUM PHOSPHATE 4 MG/ML IJ SOLN
INTRAMUSCULAR | Status: DC | PRN
Start: 2020-12-08 — End: 2020-12-08
  Administered 2020-12-08: 4 mg via INTRAVENOUS

## 2020-12-08 MED ORDER — PROPOFOL 500 MG/50ML IV EMUL
INTRAVENOUS | Status: AC
  Filled 2020-12-08: qty 50

## 2020-12-08 MED ORDER — PROPOFOL 500 MG/50ML IV EMUL
INTRAVENOUS | Status: DC | PRN
Start: 2020-12-08 — End: 2020-12-08
  Administered 2020-12-08: 35 ug/kg/min via INTRAVENOUS

## 2020-12-08 MED ORDER — PHENYLEPHRINE HCL (PRESSORS) 10 MG/ML IV SOLN
INTRAVENOUS | Status: DC | PRN
  Administered 2020-12-08: 120 ug via INTRAVENOUS
  Administered 2020-12-08: 80 ug via INTRAVENOUS
  Administered 2020-12-08: 40 ug via INTRAVENOUS
  Administered 2020-12-08: 120 ug via INTRAVENOUS
  Administered 2020-12-08 (×2): 80 ug via INTRAVENOUS
  Administered 2020-12-08: 40 ug via INTRAVENOUS
  Administered 2020-12-08: 80 ug via INTRAVENOUS

## 2020-12-08 MED ORDER — TRANEXAMIC ACID-NACL 1000-0.7 MG/100ML-% IV SOLN
INTRAVENOUS | Status: AC
  Filled 2020-12-08: qty 100

## 2020-12-08 MED ORDER — ONDANSETRON HCL 4 MG/2ML IJ SOLN
INTRAMUSCULAR | Status: DC | PRN
  Administered 2020-12-08: 4 mg via INTRAVENOUS

## 2020-12-08 MED ORDER — ACETAMINOPHEN 10 MG/ML IV SOLN
INTRAVENOUS | Status: AC
  Filled 2020-12-08: qty 100

## 2020-12-08 MED ORDER — FENTANYL CITRATE (PF) 100 MCG/2ML IJ SOLN
INTRAMUSCULAR | Status: DC | PRN
  Administered 2020-12-08 (×3): 50 ug via INTRAVENOUS

## 2020-12-08 MED ORDER — LIDOCAINE HCL (CARDIAC) PF 100 MG/5ML IV SOSY
PREFILLED_SYRINGE | INTRAVENOUS | Status: DC | PRN
  Administered 2020-12-08: 60 mg via INTRAVENOUS

## 2020-12-08 MED ORDER — MIDAZOLAM HCL 2 MG/2ML IJ SOLN
INTRAMUSCULAR | Status: AC
  Filled 2020-12-08: qty 2

## 2020-12-08 MED ORDER — TRANEXAMIC ACID-NACL 1000-0.7 MG/100ML-% IV SOLN
INTRAVENOUS | Status: DC | PRN
  Administered 2020-12-08: 1000 mg via INTRAVENOUS

## 2020-12-08 MED ORDER — MIDAZOLAM HCL 5 MG/5ML IJ SOLN
INTRAMUSCULAR | Status: DC | PRN
  Administered 2020-12-08: 2 mg via INTRAVENOUS

## 2020-12-08 MED ORDER — ONDANSETRON HCL 4 MG/2ML IJ SOLN
INTRAMUSCULAR | Status: AC
  Filled 2020-12-08: qty 10

## 2020-12-08 SURGICAL SUPPLY — 47 items
APL PRP STRL LF DISP 70% ISPRP (MISCELLANEOUS) ×2
APL SKNCLS STERI-STRIP NONHPOA (GAUZE/BANDAGES/DRESSINGS) ×2
BENZOIN TINCTURE PRP APPL 2/3 (GAUZE/BANDAGES/DRESSINGS) ×4 IMPLANT
BLADE SURG 10 STRL SS (BLADE) ×4 IMPLANT
BNDG CMPR MED 10X6 ELC LF (GAUZE/BANDAGES/DRESSINGS) ×1
BNDG ELASTIC 6X10 VLCR STRL LF (GAUZE/BANDAGES/DRESSINGS) ×2 IMPLANT
CANISTER SUCT 1200ML W/VALVE (MISCELLANEOUS) ×2 IMPLANT
CHLORAPREP W/TINT 26 (MISCELLANEOUS) ×4 IMPLANT
COVER BACK TABLE 60X90IN (DRAPES) ×2 IMPLANT
COVER MAYO STAND STRL (DRAPES) ×2 IMPLANT
DRAIN PENROSE 1/2X12 LTX STRL (WOUND CARE) ×2 IMPLANT
DRAPE LAPAROSCOPIC ABDOMINAL (DRAPES) ×2 IMPLANT
DRAPE UTILITY XL STRL (DRAPES) ×2 IMPLANT
DRSG PAD ABDOMINAL 8X10 ST (GAUZE/BANDAGES/DRESSINGS) ×8 IMPLANT
ELECT REM PT RETURN 9FT ADLT (ELECTROSURGICAL) ×2
ELECTRODE REM PT RTRN 9FT ADLT (ELECTROSURGICAL) ×1 IMPLANT
GAUZE SPONGE 4X4 12PLY STRL (GAUZE/BANDAGES/DRESSINGS) ×2 IMPLANT
GLOVE SURG ENC MOIS LTX SZ7.5 (GLOVE) ×2 IMPLANT
GLOVE SURG ENC TEXT LTX SZ7.5 (GLOVE) ×2 IMPLANT
GLOVE SURG POLYISO LF SZ6.5 (GLOVE) ×2 IMPLANT
GLOVE SURG UNDER POLY LF SZ6.5 (GLOVE) ×2 IMPLANT
GOWN STRL REUS W/ TWL LRG LVL3 (GOWN DISPOSABLE) ×4 IMPLANT
GOWN STRL REUS W/TWL LRG LVL3 (GOWN DISPOSABLE) ×8
NDL SAFETY ECLIPSE 18X1.5 (NEEDLE) ×1 IMPLANT
NEEDLE FILTER BLUNT 18X 1/2SAF (NEEDLE) ×1
NEEDLE FILTER BLUNT 18X1 1/2 (NEEDLE) ×1 IMPLANT
NEEDLE HYPO 18GX1.5 SHARP (NEEDLE) ×2
NEEDLE SPNL 18GX3.5 QUINCKE PK (NEEDLE) ×2 IMPLANT
NS IRRIG 1000ML POUR BTL (IV SOLUTION) ×2 IMPLANT
PACK BASIN DAY SURGERY FS (CUSTOM PROCEDURE TRAY) ×2 IMPLANT
PENCIL SMOKE EVACUATOR (MISCELLANEOUS) ×2 IMPLANT
SLEEVE SCD COMPRESS KNEE MED (STOCKING) ×2 IMPLANT
SPONGE T-LAP 18X18 ~~LOC~~+RFID (SPONGE) ×6 IMPLANT
STAPLER INSORB 30 2030 C-SECTI (MISCELLANEOUS) ×4 IMPLANT
STAPLER VISISTAT 35W (STAPLE) ×2 IMPLANT
STRIP SUTURE WOUND CLOSURE 1/2 (MISCELLANEOUS) ×8 IMPLANT
SUT ETHILON 3 0 PS 1 (SUTURE) ×4 IMPLANT
SUT MNCRL AB 4-0 PS2 18 (SUTURE) ×10 IMPLANT
SUT PDS 3-0 CT2 (SUTURE) ×4
SUT PDS II 3-0 CT2 27 ABS (SUTURE) ×2 IMPLANT
SUT VLOC 180 P-14 24 (SUTURE) ×4 IMPLANT
SYR 50ML LL SCALE MARK (SYRINGE) ×2 IMPLANT
TOWEL GREEN STERILE FF (TOWEL DISPOSABLE) ×6 IMPLANT
TUBE CONNECTING 20X1/4 (TUBING) ×2 IMPLANT
TUBING INFILTRATION IT-10001 (TUBING) ×2 IMPLANT
UNDERPAD 30X36 HEAVY ABSORB (UNDERPADS AND DIAPERS) ×4 IMPLANT
YANKAUER SUCT BULB TIP NO VENT (SUCTIONS) ×2 IMPLANT

## 2020-12-08 NOTE — Anesthesia Procedure Notes (Signed)
Procedure Name: LMA Insertion Date/Time: 12/08/2020 10:17 AM Performed by: Signe Colt, CRNA Pre-anesthesia Checklist: Patient identified, Emergency Drugs available, Suction available and Patient being monitored Patient Re-evaluated:Patient Re-evaluated prior to induction Oxygen Delivery Method: Circle System Utilized Preoxygenation: Pre-oxygenation with 100% oxygen Induction Type: IV induction Ventilation: Mask ventilation without difficulty LMA: LMA inserted LMA Size: 4.0 Number of attempts: 1 Airway Equipment and Method: bite block Placement Confirmation: positive ETCO2 Tube secured with: Tape Dental Injury: Teeth and Oropharynx as per pre-operative assessment

## 2020-12-08 NOTE — Anesthesia Postprocedure Evaluation (Signed)
Anesthesia Post Note  Patient: Shannyn Jankowiak  Procedure(s) Performed: Bilateral breast reduction with free nipple graft (Bilateral: Breast)     Patient location during evaluation: PACU Anesthesia Type: General Level of consciousness: awake and alert Pain management: pain level controlled Vital Signs Assessment: post-procedure vital signs reviewed and stable Respiratory status: spontaneous breathing, nonlabored ventilation and respiratory function stable Cardiovascular status: blood pressure returned to baseline and stable Postop Assessment: no apparent nausea or vomiting Anesthetic complications: no   No notable events documented.  Last Vitals:  Vitals:   12/08/20 1300 12/08/20 1315  BP: 140/82 140/78  Pulse: 76 78  Resp: 19 17  Temp:    SpO2: 100% 92%    Last Pain:  Vitals:   12/08/20 1300  TempSrc:   PainSc: 3                  Lynda Rainwater

## 2020-12-08 NOTE — Anesthesia Preprocedure Evaluation (Signed)
Anesthesia Evaluation  Patient identified by MRN, date of birth, ID band Patient awake    Reviewed: Allergy & Precautions, H&P , NPO status , Patient's Chart, lab work & pertinent test results, reviewed documented beta blocker date and time   History of Anesthesia Complications (+) PONV and history of anesthetic complications  Airway Mallampati: III   Neck ROM: full    Dental  (+) Chipped, Missing   Pulmonary neg pulmonary ROS, shortness of breath and with exertion, sleep apnea and Continuous Positive Airway Pressure Ventilation ,    Pulmonary exam normal        Cardiovascular hypertension, Pt. on medications negative cardio ROS Normal cardiovascular exam Rhythm:regular Rate:Normal     Neuro/Psych  Headaches, negative neurological ROS  negative psych ROS   GI/Hepatic negative GI ROS, Neg liver ROS,   Endo/Other  Hypothyroidism Morbid obesity  Renal/GU negative Renal ROS  negative genitourinary   Musculoskeletal   Abdominal (+) + obese,   Peds  Hematology  (+) Blood dyscrasia, anemia ,   Anesthesia Other Findings Past Medical History: No date: Allergic state No date: Allergic state No date: Anemia No date: Anemia No date: Gross hematuria No date: Headache(784.0) No date: Hyperlipidemia No date: Hyperlipidemia No date: Hypertension No date: Hypothyroidism No date: Hypothyroidism No date: Migraines No date: Morbid obesity ([place]) No date: Osteopenia No date: Osteopenia No date: Pernicious anemia No date: Sleep apnea No date: Sleep apnea Past Surgical History: No date: ABDOMINAL HYSTERECTOMY No date: CESAREAN SECTION No date: CHOLECYSTECTOMY No date: COLONOSCOPY No date: DILATION AND CURETTAGE OF UTERUS No date: FUNCTIONAL ENDOSCOPIC SINUS SURGERY BMI    Body Mass Index:  50.83 kg/m     Reproductive/Obstetrics                             Anesthesia Physical  Anesthesia  Plan  ASA: III  Anesthesia Plan: General   Post-op Pain Management:    Induction: Intravenous  PONV Risk Score and Plan: 4 or greater and Ondansetron, Dexamethasone, Midazolam, Amisulpride and Treatment may vary due to age or medical condition  Airway Management Planned: LMA  Additional Equipment:   Intra-op Plan:   Post-operative Plan: Extubation in OR  Informed Consent: I have reviewed the patients History and Physical, chart, labs and discussed the procedure including the risks, benefits and alternatives for the proposed anesthesia with the patient or authorized representative who has indicated his/her understanding and acceptance.     Dental Advisory Given  Plan Discussed with: CRNA  Anesthesia Plan Comments: (Patient consented for risks of anesthesia including but not limited to:  - adverse reactions to medications - risk of airway placement if required - damage to eyes, teeth, lips or other oral mucosa - nerve damage due to positioning  - sore throat or hoarseness - Damage to heart, brain, nerves, lungs, other parts of body or loss of life  Patient voiced understanding.)        Anesthesia Quick Evaluation

## 2020-12-08 NOTE — Op Note (Signed)
Operative Note   DATE OF OPERATION: 12/08/2020  LOCATION: [facility]   SURGICAL DEPARTMENT: Plastic Surgery  PREOPERATIVE DIAGNOSES: Bilateral symptomatic macromastia.  POSTOPERATIVE DIAGNOSES:  same  PROCEDURE: Bilateral breast reduction with free nipple graft.  SURGEON: Talmadge Coventry, MD  ASSISTANT: Verdie Shire, PA The advanced practice practitioner (APP) assisted throughout the case.  The APP was essential in retraction and counter traction when needed to make the case progress smoothly.  This retraction and assistance made it possible to see the tissue planes for the procedure.  The assistance was needed for hemostasis, tissue re-approximation and closure of the incision site.   ANESTHESIA: General.  COMPLICATIONS: None.   INDICATIONS FOR PROCEDURE:  The patient, Tammy Turner is a 60 y.o. female born on [birth year], is here for treatment of bilateral symptomatic macromastia. MRN: [id number]  CONSENT:  Informed consent was obtained directly from the patient. Risks, benefits and alternatives were fully discussed. Specific risks including but not limited to bleeding, infection, hematoma, seroma, scarring, pain, infection, contracture, asymmetry, wound healing problems, nipple necrosis, nipple numbness and need for further surgery were all discussed. The patient did have an ample opportunity to have questions answered to satisfaction.   DESCRIPTION OF PROCEDURE:  The patient was marked preoperatively for a Wise pattern skin excision.  The patient was taken to the operating room. SCDs were placed and antibiotics were given. General anesthesia was administered.The patient's operative site was prepped and draped in a sterile fashion. A time out was performed and all information was confirmed to be correct.  Right Breast: The breast was infiltrated with tumescent solution to help with hemostasis.  The nipple was marked with a cookie cutter and excised in preparation  for grafting.  The excision was then performed with cautery.  Hemostasis was obtained and the wound was stapled closed.  A penrose drain was left laterally.  Left breast:  The breast was infiltrated with tumescent solution to help with hemostasis.  The nipple was marked with a cookie cutter and excised in preparation for grafting.  The excision was then performed with cautery.  Hemostasis was obtained and the wound was stapled closed. A penrose drain was left laterally.  Patient was then checked for size and symmetry.  Minor modifications were made.  This resulted in a total of 1221g removed from the right side and 1485g removed from the left side.  The inframammary incision was closed with a combination of buried in-sorb staples and a running 3-0 Quill suture.  The vertical and limbs were closed with interrupted buried 4-0 Monocryl and a running 4-0 Quill suture.  Nipple location was marked with a 97mm cookie cutter and deepithelialized.  Nipple grafts were then thinned out and inset with 4-0 Chromic.  Xeroform and a scrub brush foam were used to fashion and bolster that was secured with 4-0 Nylon.  Steri-Strips were then applied along with a soft dressing and Ace wrap.  The patient tolerated the procedure well.  There were no complications. The patient was allowed to wake from anesthesia, extubated and taken to the recovery room in satisfactory condition.  I was present for the entire procedure.

## 2020-12-08 NOTE — Discharge Instructions (Addendum)
Activity As tolerated: NO showers for 3 days. Keep ACE wrap on breasts until then. After showering, put ACE wrap back on, this is important for compression. NO driving while in pain, taking pain medication or if you are unable to safely react to traffic. No heavy activities Use ibuprofen or tylenol as needed. Avoid more than 3,000 mg of tylenol in 24 hours.  No narcotics were prescribed per your request.  Diet: Regular. Drink plenty of fluids and eat healthy, high protein, low carbs.  Wound Care: Keep dressing clean & dry. You may change bandages after showering if you continue to notice some drainage. You can reuse bandages if they are not dirty/soiled.  Special Instructions: Call Doctor if any unusual problems occur such as pain, excessive Bleeding, unrelieved Nausea/vomiting, Fever &/or chills  Follow-up appointment: Scheduled for next week.    Post Anesthesia Home Care Instructions  Activity: Get plenty of rest for the remainder of the day. A responsible individual must stay with you for 24 hours following the procedure.  For the next 24 hours, DO NOT: -Drive a car -Paediatric nurse -Drink alcoholic beverages -Take any medication unless instructed by your physician -Make any legal decisions or sign important papers.  Meals: Start with liquid foods such as gelatin or soup. Progress to regular foods as tolerated. Avoid greasy, spicy, heavy foods. If nausea and/or vomiting occur, drink only clear liquids until the nausea and/or vomiting subsides. Call your physician if vomiting continues.  Special Instructions/Symptoms: Your throat may feel dry or sore from the anesthesia or the breathing tube placed in your throat during surgery. If this causes discomfort, gargle with warm salt water. The discomfort should disappear within 24 hours.  If you had a scopolamine patch placed behind your ear for the management of post- operative nausea and/or vomiting:  1. The medication in the  patch is effective for 72 hours, after which it should be removed.  Wrap patch in a tissue and discard in the trash. Wash hands thoroughly with soap and water. 2. You may remove the patch earlier than 72 hours if you experience unpleasant side effects which may include dry mouth, dizziness or visual disturbances. 3. Avoid touching the patch. Wash your hands with soap and water after contact with the patch.  No tylenol today until after 6:30 if needed.

## 2020-12-08 NOTE — Interval H&P Note (Signed)
History and Physical Interval Note:  12/08/2020 9:41 AM  Tammy Turner  has presented today for surgery, with the diagnosis of macromastia, history of breast cancer.  The various methods of treatment have been discussed with the patient and family. After consideration of risks, benefits and other options for treatment, the patient has consented to  Procedure(s) with comments: Bilateral breast reduction with free nipple graft (Bilateral) - 2 hours as a surgical intervention.  The patient's history has been reviewed, patient examined, no change in status, stable for surgery.  I have reviewed the patient's chart and labs.  Questions were answered to the patient's satisfaction.     Cindra Presume

## 2020-12-08 NOTE — Transfer of Care (Signed)
Immediate Anesthesia Transfer of Care Note  Patient: Tammy Turner  Procedure(s) Performed: Bilateral breast reduction with free nipple graft (Bilateral: Breast)  Patient Location: PACU  Anesthesia Type:General  Level of Consciousness: awake, alert  and oriented  Airway & Oxygen Therapy: Patient Spontanous Breathing and Patient connected to face mask oxygen  Post-op Assessment: Report given to RN and Post -op Vital signs reviewed and stable  Post vital signs: Reviewed and stable  Last Vitals:  Vitals Value Taken Time  BP 137/79 12/08/20 1215  Temp    Pulse 101 12/08/20 1217  Resp    SpO2 100 % 12/08/20 1217  Vitals shown include unvalidated device data.  Last Pain:  Vitals:   12/08/20 0807  TempSrc: Oral  PainSc: 0-No pain      Patients Stated Pain Goal: 5 (46/96/29 5284)  Complications: No notable events documented.

## 2020-12-09 ENCOUNTER — Encounter (HOSPITAL_BASED_OUTPATIENT_CLINIC_OR_DEPARTMENT_OTHER): Payer: Self-pay | Admitting: Plastic Surgery

## 2020-12-09 LAB — SURGICAL PATHOLOGY

## 2020-12-11 ENCOUNTER — Telehealth: Payer: Self-pay | Admitting: Plastic Surgery

## 2020-12-11 NOTE — Telephone Encounter (Signed)
Returned patients call. Advised to keep ace wrap on for 3 days after surgery. May change  dressings as needed, keep the boulders attached along with steri strips. Do not take a shower until cleared, may take a bath, but not get breast wet. Patient did not know the process of changing the JP bulb. Explained the steps. PO follow up is 12/16/2020 with Dr. Claudia Desanctis. Patient understood and agreed with plan.

## 2020-12-11 NOTE — Telephone Encounter (Signed)
Patient is requesting instructions on when to remove the white bandages. Sx 11/1. Please call to advise. Thank you.

## 2020-12-16 ENCOUNTER — Ambulatory Visit (INDEPENDENT_AMBULATORY_CARE_PROVIDER_SITE_OTHER): Payer: 59 | Admitting: Plastic Surgery

## 2020-12-16 ENCOUNTER — Other Ambulatory Visit: Payer: Self-pay

## 2020-12-16 DIAGNOSIS — N62 Hypertrophy of breast: Secondary | ICD-10-CM

## 2020-12-16 NOTE — Progress Notes (Signed)
Patient presents 1 week postop from bilateral breast reduction with free nipple graft.  She is overall very happy and her back pain is much better.  On exam everything looks to be doing well.  All incisions are intact.  Penrose drains were removed.  Bolsters were removed from the nipple areolar complex and graft seems to be adherent with appropriate color.  We will plan to continue wound care over the nipple grafts and continue compressive garments.  She will continue to avoid strenuous activity.  We will see her again in 2 weeks.  All of her questions were answered.

## 2020-12-24 ENCOUNTER — Telehealth: Payer: Self-pay | Admitting: Plastic Surgery

## 2020-12-24 NOTE — Telephone Encounter (Signed)
Per Matt, alternate tylenol and ibuprofen for pain and fever. Monitor redness tonight to see if it gets worse and if it does, call us tomorrow and we'll send in an abx.

## 2020-12-24 NOTE — Telephone Encounter (Signed)
Pt is aware of Matt's instructions and conveyed understanding.

## 2020-12-24 NOTE — Telephone Encounter (Signed)
Patient called and is running a low grade fever and left breast around nipple area is warm to touch and painful. Patient just had surgery a couple of weeks ago.  Please follow up with patient

## 2020-12-25 ENCOUNTER — Telehealth: Payer: Self-pay

## 2020-12-25 ENCOUNTER — Other Ambulatory Visit (INDEPENDENT_AMBULATORY_CARE_PROVIDER_SITE_OTHER): Payer: 59 | Admitting: Physician Assistant

## 2020-12-25 DIAGNOSIS — N62 Hypertrophy of breast: Secondary | ICD-10-CM

## 2020-12-25 MED ORDER — CLINDAMYCIN HCL 150 MG PO CAPS: 450.0000 mg | ORAL_CAPSULE | Freq: Three times a day (TID) | ORAL | 0 refills | Status: AC

## 2020-12-25 NOTE — Progress Notes (Signed)
Will prescribe clindamycin to cover for potential infection.  Spoke with [place] PA-C who can see her first thing on Monday AM.  Tried calling patient, but no answer.  Will continue to try to communicate that new plan with patient.

## 2020-12-25 NOTE — Telephone Encounter (Signed)
Returned patients call. She recently had BL mastopexy with free nipple graft with Dr. Claudia Desanctis. Penrose drains and bolsters were removed on 12/16/2020 in the office. Since this past Wednesday, she as been having low grade fever between 99.8-100.2 along with pain, throbbing, warmth, engorged sensation of the left breast. Patient mentioned she has been having a slight yellowish discharge of the left areola. She has been applying Vaseline, covering with gauze and keeping a snug binder on 24/7. Discussed with Garrett-PA and is sending in a 7 day of clindamycin TID to [facility] [address], [place]. Next follow up is 12/30/2020. Should anything change or worsen prior to next visit, to call our office. Patient understood and agreed with plan.

## 2020-12-25 NOTE — Telephone Encounter (Signed)
Patient called to say she has warmth and throbbing in her left breast and still has a low-grade fever.  Patient said that she also started throbbing yesterday behind her left breast and under her shoulder blade, she said she isn't sure if it's related.  Patient would like for [place] to call in an antibiotic.  Please call.  *[facility] [address] in [place].

## 2020-12-29 NOTE — Progress Notes (Signed)
Patient is a  60 y.o.-year-old female status post bilateral breast reduction via free nipple graft with Dr.  Claudia Desanctis on 12/08/2020.  Patient is 3 weeks postop.  She previously had some redness of the left breast and was prescribed an antibiotic, reports starting this on Friday and her symptoms have improved.  She does report that she is having some left upper back pain, reports it is a stinging sensation.  She is not having any difficulty breathing.  Chaperone present on exam Bilateral nipple areolar complex grafts are overall healing well, right graft has completely epithelialized except for the central portion of the nipple.  There is no sloughing noted on this right side.  Left nipple areolar complex graft is overall healing well, centrally there is some sloughing epithelium but no foul odors.  There is no cellulitic changes noted of either breast.  A little bit of erythema surrounding the right breast, may be related to the radiation. No subcutaneous fluid collections noted with palpation.  On exam of her back, no subcutaneous fluid collection noted. No erythema. No overlying skin changes.   Recommend continuing with compressive garment 24/7 until 6 weeks post-op,  avoiding strenuous activity/heavy lifting until 6 weeks post-op Recommend following up in 2-3 weeks. Continue with vaseline and gauze to bilateral NAC.   NSAIDs for left back pain.   All the patient's questions were answered to their content. Recommend calling with any questions or concerns.

## 2020-12-30 ENCOUNTER — Other Ambulatory Visit: Payer: Self-pay

## 2020-12-30 ENCOUNTER — Ambulatory Visit (INDEPENDENT_AMBULATORY_CARE_PROVIDER_SITE_OTHER): Payer: 59 | Admitting: Surgical

## 2020-12-30 ENCOUNTER — Encounter: Payer: Self-pay | Admitting: Surgical

## 2020-12-30 DIAGNOSIS — N62 Hypertrophy of breast: Secondary | ICD-10-CM

## 2021-01-20 ENCOUNTER — Ambulatory Visit (INDEPENDENT_AMBULATORY_CARE_PROVIDER_SITE_OTHER): Payer: 59 | Admitting: Surgical

## 2021-01-20 ENCOUNTER — Other Ambulatory Visit: Payer: Self-pay

## 2021-01-20 DIAGNOSIS — M545 Low back pain, unspecified: Secondary | ICD-10-CM

## 2021-01-20 DIAGNOSIS — N62 Hypertrophy of breast: Secondary | ICD-10-CM

## 2021-01-20 DIAGNOSIS — M4004 Postural kyphosis, thoracic region: Secondary | ICD-10-CM

## 2021-01-20 DIAGNOSIS — M546 Pain in thoracic spine: Secondary | ICD-10-CM

## 2021-01-20 NOTE — Progress Notes (Signed)
Patient is a 60 year old female here for follow-up after bilateral breast reduction via free nipple graft with Dr. Claudia Desanctis on 12/08/2020.  She is 6 weeks postop.  She reports overall she is doing really well, she is very pleased with her outcome.  She reports significant improvement in her neck and back pain.  She does endorse some tenderness of the left nipple areolar complex, some stinging and burning pain.  She has continued with Vaseline and gauze to bilateral NAC's.  On exam bilateral NAC grafts are healing well, on the left NAC centrally she has a small residual wound that is approximately 5 x 2 mm.  There is no surrounding erythema.  It is flush with the epithelium and has a good base of granulation tissue.  Bilateral breast incisions are intact and healing well.  No subcutaneous fluid collections noted.  No erythema or cellulitic changes.  We discussed that she can return to exercising, no restrictions at this point.  Recommend continuing with Vaseline and gauze to the left NAC until the wound has healed.  Discussed that this may take 1 to 3 weeks for it to heal.  I do not see any signs of infection on exam.  We discussed scheduling additional follow-up in a few weeks or following up as needed, patient is comfortable following up on an as-needed basis.

## 2021-01-27 ENCOUNTER — Telehealth: Payer: Self-pay | Admitting: Oncology

## 2021-01-27 ENCOUNTER — Ambulatory Visit
Admission: RE | Admit: 2021-01-27 | Discharge: 2021-01-27 | Disposition: A | Discharge status: Home / Self Care | Payer: 59 | Source: Ambulatory Visit | Attending: General Surgery | Admitting: General Surgery

## 2021-01-27 ENCOUNTER — Other Ambulatory Visit: Payer: Self-pay

## 2021-01-27 DIAGNOSIS — R911 Solitary pulmonary nodule: Secondary | ICD-10-CM | POA: Diagnosis present

## 2021-01-27 NOTE — Telephone Encounter (Signed)
Pt called to cancel her appt for today. She states that she is going to just see her primary MD.

## 2021-02-11 ENCOUNTER — Ambulatory Visit: Payer: 59 | Admitting: Radiation Oncology

## 2021-05-20 ENCOUNTER — Encounter: Payer: Self-pay | Admitting: Internal Medicine

## 2021-05-20 ENCOUNTER — Ambulatory Visit: Payer: 59 | Admitting: Internal Medicine

## 2021-05-20 VITALS — BP 110/80 | HR 80 | Ht 60.75 in | Wt 245.0 lb

## 2021-05-20 DIAGNOSIS — R002 Palpitations: Secondary | ICD-10-CM | POA: Diagnosis not present

## 2021-05-20 DIAGNOSIS — I10 Essential (primary) hypertension: Secondary | ICD-10-CM

## 2021-05-20 DIAGNOSIS — I471 Supraventricular tachycardia: Secondary | ICD-10-CM

## 2021-05-20 DIAGNOSIS — R079 Chest pain, unspecified: Secondary | ICD-10-CM | POA: Diagnosis not present

## 2021-05-20 DIAGNOSIS — I4729 Other ventricular tachycardia: Secondary | ICD-10-CM

## 2021-05-20 NOTE — Patient Instructions (Signed)
Medication Instructions:  ?No changes at this time.  ? ?*If you need a refill on your cardiac medications before your next appointment, please call your pharmacy* ? ? ?Lab Work: ?None ? ?If you have labs (blood work) drawn today and your tests are completely normal, you will receive your results only by: ?MyChart Message (if you have MyChart) OR ?A paper copy in the mail ?If you have any lab test that is abnormal or we need to change your treatment, we will call you to review the results. ? ? ?Testing/Procedures: ?None ? ? ?Follow-Up: ?At [facility], you and your health needs are our priority.  As part of our continuing mission to provide you with exceptional heart care, we have created designated Provider Care Teams.  These Care Teams include your primary Cardiologist (physician) and Advanced Practice Providers (APPs -  Physician Assistants and Nurse Practitioners) who all work together to provide you with the care you need, when you need it. ? ? ?Your next appointment:   ?2 year(s) ? ?The format for your next appointment:   ?In Person ? ?Provider:   ?You may see Nelva Bush, MD or one of the following Advanced Practice Providers on your designated Care Team:   ?Murray Hodgkins, NP ?Christell Faith, PA-C ?Cadence Kathlen Mody, PA-C{ ? ?Important Information About Sugar ? ? ? ? ?  ?

## 2021-05-20 NOTE — Progress Notes (Signed)
? ?Follow-up Outpatient Visit ?Date: 05/20/2021 ? ?Primary Care Provider: ?Juluis Pitch, MD ?[address] ?[place] [id number] ? ?Chief Complaint: Follow-up chest pain and shortness of breath ? ?HPI:  Ms. Ungar is a 61 y.o. female with history of hypertension, hyperlipidemia, hypothyroidism, pernicious anemia, morbid obesity, obstructive sleep apnea, and breast cancer, who presents for follow-up of chest pain, shortness of breath, and palpitations.  I last saw her in October for preop evaluation in anticipation of mammoplasty.  She was feeling well from a heart standpoint.  We had previously discussed myocardial PET/CT at [facility] for evaluation of atypical chest pain.  However, given that she was asymptomatic at our last visit with good functional capacity, we agreed to defer this. ? ?Today, Ms. Rochelle reports that she is feeling well.  She tolerated her mammoplasty well and has experienced resolution of her back pain.  She has also lost about 25 pounds since our last visit, which she attributes to adjustments in her thyroid medication as well as increasing her activity and changing her diet.  She denies chest pain, shortness of breath, palpitations, and lightheadedness.  She notes mild dependent edema at times, which is stable. ? ?-------------------------------------------------------------------------------------------------- ? ?Past Medical History:  ?Diagnosis Date  ? Allergic state   ? Anemia   ? Atypical chest pain   ? a. 0/2015 Myoview: EF 64%, no ischemia.  ? Breast cancer of upper-outer quadrant of right female breast ([place]) 06/2016  ? ER 95%, PR 90%, HER-2/neu not overexpressed.  ? Complication of anesthesia   ? SEVERE MUSCLE ACHES FROM DIAPHRAGM TO THIGHS X 2 DAYS AFTER PARTIAL MASTECTOMY ON 07-25-16-PT WAS GIVEN SUCCINYLCHOLINE FOR INDUCTION  ? Dyspnea on exertion   ? a. 09/2013 Echo: EF 60-65%, no rwma;  b. 11/2013 Myoview: EF 64%, no ischemia; c. 09/2016 Echo: EF 55-60%, no rwma; d. 12/2019 Echo: EF  60-65%, mod asymm LVH, nl RV fxn, mildly dil LA.   ? Family history of ovarian cancer   ? Pt is My Risk cancer genetic testing neg 2016  ? Genetic testing 2016  ? My Risk negative  ? Gross hematuria   ? Headache(784.0)   ? Hyperlipidemia   ? Hypertension   ? Hypothyroidism   ? Migraines   ? Morbid obesity ([place])   ? Osteopenia   ? Osteopenia   ? Palpitations   ? a. 01/2020 Zio: Avg HR 83 (47-129), rare PACs/PVCs. Six atrial runs up to 18 beats (179bpm). NSVT x 2 (up to 5 beats - 200bpm). No prolonged arrhythmias or pauses. Triggered events = sinus rhythm, w/ some PACs/PVCs.  ? Pernicious anemia   ? Personal history of radiation therapy   ? Sleep apnea 2010  ? uses CPAP  ? Syncope   ? a. 09/2016 - seen in ED - limited w/u unrevealing; b. 09/2016 Echo: EF 55-60%, no rwma;  b. 10/2016 Event monitor:  no signficant arrhythmias. Occas pac's.  ? ?Past Surgical History:  ?Procedure Laterality Date  ? ABDOMINAL HYSTERECTOMY  2011  ? [place]; Dr. Laurey Morale, due to leio/adenomyosis  ? BREAST EXCISIONAL BIOPSY Right 07/25/2016  ? lumpectomy radation reexcison + anterior margins 08-08-16  ? BREAST LUMPECTOMY Right 08/08/2016  ? Procedure: BREAST RE-EXCISION;  Surgeon: Robert Bellow, MD;  Location: [facility] ORS;  Service: General;  Laterality: Right;  ? BREAST REDUCTION SURGERY Bilateral 12/08/2020  ? Procedure: Bilateral breast reduction with free nipple graft;  Surgeon: Cindra Presume, MD;  Location: [place];  Service: Plastics;  Laterality: Bilateral;  ? CESAREAN SECTION    ? x 2  ? CHOLECYSTECTOMY    ? COLONOSCOPY    ? COLONOSCOPY WITH PROPOFOL N/A 10/05/2015  ? Procedure: COLONOSCOPY WITH PROPOFOL;  Surgeon: Manya Silvas, MD;  Location: [facility] ENDOSCOPY;  Service: Endoscopy;  Laterality: N/A;  ? COLONOSCOPY WITH PROPOFOL N/A 08/21/2020  ? Procedure: COLONOSCOPY WITH PROPOFOL;  Surgeon: Robert Bellow, MD;  Location: [facility] ENDOSCOPY;  Service: Endoscopy;  Laterality: N/A;  ? DILATION AND CURETTAGE OF UTERUS    ?  FUNCTIONAL ENDOSCOPIC SINUS SURGERY    ? PARTIAL MASTECTOMY WITH AXILLARY SENTINEL LYMPH NODE BIOPSY Right 07/25/2016  ? Procedure: PARTIAL MASTECTOMY WITH AXILLARY SENTINEL LYMPH NODE BIOPSY;  Surgeon: Robert Bellow, MD;  Location: [facility] ORS;  Service: General;  Laterality: Right;  ? ? ?Current Meds  ?Medication Sig  ? acetaminophen (TYLENOL) 500 MG tablet Take 500 mg by mouth every 6 (six) hours as needed for headache.  ? Artificial Tear Ointment (DRY EYES OP) Apply to eye at bedtime.  ? ASCORBIC ACID PO Take 2 tablets by mouth every evening.  ? CALCIUM PO Take by mouth. Takes 2-3 times per week  ? cholecalciferol (VITAMIN D) 1000 units tablet Take 1,000 Units by mouth every evening.  ? Cyanocobalamin (VITAMIN B-12 IJ) Inject 1,000 mcg as directed every 30 (thirty) days.  ? dimenhyDRINATE (DRAMAMINE) 50 MG tablet Take 50 mg by mouth every 8 (eight) hours as needed.  ? diphenhydrAMINE (BENADRYL) 25 mg capsule Take 25 mg by mouth once as needed.  ? EPINEPHrine 0.3 mg/0.3 mL IJ SOAJ injection INJECT INTRAMUSCULARLY AS DIRECTED  ? exemestane (AROMASIN) 25 MG tablet Take 1 tablet (25 mg total) by mouth daily after breakfast.  ? losartan (COZAAR) 100 MG tablet Take 100 mg by mouth daily.  ? MAGNESIUM PO Takes 2-3 times per week  ? potassium chloride SA (KLOR-CON) 20 MEQ tablet Take 20 mEq by mouth daily.  ? thyroid (ARMOUR) 120 MG tablet Take 120 mg by mouth daily before breakfast.  ? thyroid (ARMOUR) 30 MG tablet Take 30 mg by mouth daily before breakfast.  ? ZINC SULFATE PO Takes occassionally  ? ? ?Allergies: Morphine, Niacin, Septra [sulfamethoxazole-trimethoprim], Bisphosphonates, Doxycycline, Iodine, Ivp dye [iodinated contrast media], Levaquin [levofloxacin in d5w], Succinylcholine, Cephalexin, Penicillins, and Sulfa antibiotics ? ?Social History  ? ?Tobacco Use  ? Smoking status: Never  ? Smokeless tobacco: Never  ?Vaping Use  ? Vaping Use: Never used  ?Substance Use Topics  ? Alcohol use: No  ? Drug use: No   ? ? ?Family History  ?Problem Relation Age of Onset  ? Pulmonary embolism Mother   ? Hypertension Mother   ? Hyperlipidemia Mother   ? Hypertension Father   ? Colon cancer Father 28  ? Ovarian cancer Maternal Grandmother   ?     21  ? Colon cancer Paternal Grandmother   ?     26  ? Colon cancer Maternal Grandfather 49  ? Fainting Paternal Grandfather   ? Breast cancer Neg Hx   ? ? ?Review of Systems: ?A 12-system review of systems was performed and was negative except as noted in the HPI. ? ?-------------------------------------------------------------------------------------------------- ? ?Physical Exam: ?BP 110/80 (BP Location: Left Arm, Patient Position: Sitting, Cuff Size: Large)   Pulse 80   Ht 5' 0.75" (1.543 m)   Wt 245 lb (111.1 kg)   SpO2 97%   BMI 46.67 kg/m?  ? ?General:  NAD. ?Neck: No JVD or HJR, though  body habitus limits evaluation. ?Lungs: Clear to auscultation bilaterally without wheezes or crackles. ?Heart: Regular rate and rhythm without murmurs, rubs, or gallops. ?Abdomen: Soft, nontender, nondistended. ?Extremities: Trace pretibial edema. ? ?EKG: Normal sinus rhythm with incomplete right bundle branch block.  No significant change from prior tracing on 11/13/2020. ? ?Lab Results  ?Component Value Date  ? WBC 7.5 09/17/2020  ? HGB 14.6 09/17/2020  ? HCT 42.2 09/17/2020  ? MCV 90.9 09/17/2020  ? PLT 236 09/17/2020  ? ? ?Lab Results  ?Component Value Date  ? NA 135 09/17/2020  ? K 3.6 09/17/2020  ? CL 103 09/17/2020  ? CO2 25 09/17/2020  ? BUN 14 09/17/2020  ? CREATININE 0.56 09/17/2020  ? GLUCOSE 93 09/17/2020  ? ALT 30 11/25/2016  ? ? ?No results found for: CHOL, HDL, LDLCALC, LDLDIRECT, TRIG, CHOLHDL ? ?-------------------------------------------------------------------------------------------------- ? ?ASSESSMENT AND PLAN: ?Chest pain, shortness of breath, palpitations, NSVT, and PSVT: ?Symptoms have resolved.  We previously discussed noninvasive ischemia testing, but given excellent  functional capacity without continued symptoms, we will defer this.  Prior event monitor showed brief PSVT and NSVT as well as rare PACs and PVCs.  Given lack of continued symptoms, we will not pursue additional test

## 2021-05-21 ENCOUNTER — Encounter: Payer: Self-pay | Admitting: Internal Medicine

## 2021-09-09 ENCOUNTER — Ambulatory Visit: Payer: 59 | Admitting: Obstetrics and Gynecology

## 2021-10-18 ENCOUNTER — Other Ambulatory Visit: Payer: Self-pay | Admitting: General Surgery

## 2021-10-18 DIAGNOSIS — Z170 Estrogen receptor positive status [ER+]: Secondary | ICD-10-CM

## 2021-10-21 ENCOUNTER — Ambulatory Visit: Payer: 59 | Admitting: Obstetrics and Gynecology

## 2021-12-03 ENCOUNTER — Ambulatory Visit
Admission: RE | Admit: 2021-12-03 | Discharge: 2021-12-03 | Disposition: A | Discharge status: Home / Self Care | Payer: 59 | Source: Ambulatory Visit | Attending: General Surgery | Admitting: General Surgery

## 2021-12-03 DIAGNOSIS — C50411 Malignant neoplasm of upper-outer quadrant of right female breast: Secondary | ICD-10-CM | POA: Diagnosis present

## 2021-12-03 DIAGNOSIS — Z170 Estrogen receptor positive status [ER+]: Secondary | ICD-10-CM | POA: Diagnosis present

## 2021-12-03 DIAGNOSIS — Z1231 Encounter for screening mammogram for malignant neoplasm of breast: Secondary | ICD-10-CM | POA: Diagnosis not present

## 2021-12-26 DIAGNOSIS — M810 Age-related osteoporosis without current pathological fracture: Secondary | ICD-10-CM | POA: Insufficient documentation

## 2021-12-26 NOTE — Progress Notes (Unsigned)
PCP: Juluis Pitch, MD   No chief complaint on file.   HPI:      [name] is a 61 y.o. [id number] who LMP was No LMP recorded. Patient has had a hysterectomy., presents today for her annual examination.  Her menses are absent and she is postmenopausal.  She does not have intermenstrual bleeding.  She does have vasomotor sx with aromasin for breast cancer.   Sex activity: single partner, contraception - post menopausal status. She does have vaginal dryness and uses coconut oil with sx relief.  Has been having vag irritation the past few months. Sx started after using cottonelle wipes that were later recalled due to bacteria (some people needed abx tx). Sx improving since stopped using them but vaginal opening and perineal area still sensitive. No increased vag d/c.  Last Pap: 11/11/19 Results were: no abnormalities /neg HPV DNA.  Hx of STDs: none  Last mammogram: 12/03/21 was normal, repeat in 12 months. Has appt 11/21. 2018 mammo was Cat 5. She had a bx with Dr. Bary Castilla and was found to have stage 1 RT breast cancer, ER/PR+. She did excision with radiation, and started femara last yr. Changed to aromasin due to bad side effects with femara. Still having them but not as bad.   There is no FH of breast cancer. There is a FH of ovarian cancer in her MGM. Pt tested MyRisk neg 2016. The patient does do self-breast exams.  Colonoscopy: colonoscopy 7/22 with Dr. Bary Castilla and 2017 with abnormalities.  Repeat due after 5 ***years. FH colon cancer in several fam members, including her father.  DEXA: 10/22 and 8/20 at [facility] with osteoporosis in spine and hip 10/22. On actonel as well as ca/Vit D.    Tobacco use: The patient denies current or previous tobacco use. Alcohol use: none  No drug use Exercise: moderately active  She does get adequate calcium and Vitamin D in her diet.  Labs with PCP.  Past Medical History:  Diagnosis Date   Allergic state    Anemia    Atypical chest pain     a. 0/2015 Myoview: EF 64%, no ischemia.   Breast cancer of upper-outer quadrant of right female breast ([place]) 06/2016   ER 95%, PR 90%, HER-2/neu not overexpressed.   Complication of anesthesia    SEVERE MUSCLE ACHES FROM DIAPHRAGM TO THIGHS X 2 DAYS AFTER PARTIAL MASTECTOMY ON 07-25-16-PT WAS GIVEN SUCCINYLCHOLINE FOR INDUCTION   Dyspnea on exertion    a. 09/2013 Echo: EF 60-65%, no rwma;  b. 11/2013 Myoview: EF 64%, no ischemia; c. 09/2016 Echo: EF 55-60%, no rwma; d. 12/2019 Echo: EF 60-65%, mod asymm LVH, nl RV fxn, mildly dil LA.    Family history of ovarian cancer    Pt is My Risk cancer genetic testing neg 2016   Genetic testing 2016   My Risk negative   Gross hematuria    Headache(784.0)    Hyperlipidemia    Hypertension    Hypothyroidism    Migraines    Morbid obesity ([place])    Osteopenia    Osteopenia    Palpitations    a. 01/2020 Zio: Avg HR 83 (47-129), rare PACs/PVCs. Six atrial runs up to 18 beats (179bpm). NSVT x 2 (up to 5 beats - 200bpm). No prolonged arrhythmias or pauses. Triggered events = sinus rhythm, w/ some PACs/PVCs.   Pernicious anemia    Personal history of radiation therapy    Sleep apnea 2010   uses CPAP  Syncope    a. 09/2016 - seen in ED - limited w/u unrevealing; b. 09/2016 Echo: EF 55-60%, no rwma;  b. 10/2016 Event monitor:  no signficant arrhythmias. Occas pac's.    Past Surgical History:  Procedure Laterality Date   ABDOMINAL HYSTERECTOMY  2011   [place]; Dr. Laurey Morale, due to leio/adenomyosis   BREAST EXCISIONAL BIOPSY Right 07/25/2016   lumpectomy radation reexcison + anterior margins 08-08-16   BREAST LUMPECTOMY Right 08/08/2016   Procedure: BREAST RE-EXCISION;  Surgeon: Robert Bellow, MD;  Location: [facility] ORS;  Service: General;  Laterality: Right;   BREAST REDUCTION SURGERY Bilateral 12/08/2020   Procedure: Bilateral breast reduction with free nipple graft;  Surgeon: Cindra Presume, MD;  Location: [place];  Service: Plastics;   Laterality: Bilateral;   CESAREAN SECTION     x 2   CHOLECYSTECTOMY     COLONOSCOPY     COLONOSCOPY WITH PROPOFOL N/A 10/05/2015   Procedure: COLONOSCOPY WITH PROPOFOL;  Surgeon: Manya Silvas, MD;  Location: [facility] ENDOSCOPY;  Service: Endoscopy;  Laterality: N/A;   COLONOSCOPY WITH PROPOFOL N/A 08/21/2020   Procedure: COLONOSCOPY WITH PROPOFOL;  Surgeon: Robert Bellow, MD;  Location: [facility] ENDOSCOPY;  Service: Endoscopy;  Laterality: N/A;   DILATION AND CURETTAGE OF UTERUS     FUNCTIONAL ENDOSCOPIC SINUS SURGERY     PARTIAL MASTECTOMY WITH AXILLARY SENTINEL LYMPH NODE BIOPSY Right 07/25/2016   Procedure: PARTIAL MASTECTOMY WITH AXILLARY SENTINEL LYMPH NODE BIOPSY;  Surgeon: Robert Bellow, MD;  Location: [facility] ORS;  Service: General;  Laterality: Right;   REDUCTION MAMMAPLASTY Bilateral 12/2020    Family History  Problem Relation Age of Onset   Pulmonary embolism Mother    Hypertension Mother    Hyperlipidemia Mother    Hypertension Father    Colon cancer Father 83   Ovarian cancer Maternal Grandmother        2   Colon cancer Paternal Grandmother        69   Colon cancer Maternal Grandfather 78   Fainting Paternal Grandfather    Breast cancer Neg Hx     Social History   Socioeconomic History   Marital status: Married    Spouse name: Not on file   Number of children: Not on file   Years of education: Not on file   Highest education level: Not on file  Occupational History   Not on file  Tobacco Use   Smoking status: Never   Smokeless tobacco: Never  Vaping Use   Vaping Use: Never used  Substance and Sexual Activity   Alcohol use: No   Drug use: No   Sexual activity: Yes    Birth control/protection: Surgical    Comment: Hysterectomy  Other Topics Concern   Not on file  Social History Narrative   Not on file   Social Determinants of Health   Financial Resource Strain: Not on file  Food Insecurity: Not on file  Transportation Needs: Not on file   Physical Activity: Not on file  Stress: Not on file  Social Connections: Not on file  Intimate Partner Violence: Not on file    No outpatient medications have been marked as taking for the 12/28/21 encounter (Appointment) with Rasheedah Reis, Elmo Putt B, PA-C.      ROS:  Review of Systems  Constitutional:  Positive for fatigue. Negative for fever and unexpected weight change.  Respiratory:  Negative for cough, shortness of breath and wheezing.   Cardiovascular:  Negative for chest pain,  palpitations and leg swelling.  Gastrointestinal:  Negative for blood in stool, constipation, diarrhea, nausea and vomiting.  Endocrine: Negative for cold intolerance, heat intolerance and polyuria.  Genitourinary:  Negative for dyspareunia, dysuria, flank pain, frequency, genital sores, hematuria, menstrual problem, pelvic pain, urgency, vaginal bleeding, vaginal discharge and vaginal pain.  Musculoskeletal:  Negative for back pain, joint swelling and myalgias.  Skin:  Negative for rash.  Neurological:  Negative for dizziness, syncope, light-headedness, numbness and headaches.  Hematological:  Negative for adenopathy.  Psychiatric/Behavioral:  Negative for agitation, confusion, dysphoric mood, sleep disturbance and suicidal ideas. The patient is not nervous/anxious.      Objective: There were no vitals taken for this visit.   Physical Exam Constitutional:      Appearance: She is well-developed.  Genitourinary:     No vaginal discharge, erythema or tenderness.      Right Adnexa: not tender and no mass present.    Left Adnexa: not tender and no mass present.    No cervical polyp.     Uterus is not enlarged or tender.  Breasts:    Right: No mass, nipple discharge, skin change or tenderness.     Left: No mass, nipple discharge, skin change or tenderness.  Neck:     Thyroid: No thyromegaly.  Cardiovascular:     Rate and Rhythm: Normal rate and regular rhythm.     Heart sounds: Normal heart  sounds. No murmur heard. Pulmonary:     Effort: Pulmonary effort is normal.     Breath sounds: Normal breath sounds.  Abdominal:     Palpations: Abdomen is soft.     Tenderness: There is no abdominal tenderness. There is no guarding.  Musculoskeletal:        General: Normal range of motion.     Cervical back: Normal range of motion.  Neurological:     General: No focal deficit present.     Mental Status: She is alert and oriented to person, place, and time.     Cranial Nerves: No cranial nerve deficit.  Skin:    General: Skin is warm and dry.  Psychiatric:        Mood and Affect: Mood normal.        Behavior: Behavior normal.        Thought Content: Thought content normal.        Judgment: Judgment normal.  Vitals reviewed.     Assessment/Plan:  Encounter for annual routine gynecological examination  Cervical cancer screening - Plan: Cytology - PAP  Screening for HPV (human papillomavirus) - Plan: Cytology - PAP  Malignant neoplasm of upper-outer quadrant of right breast in female, estrogen receptor positive ([place]); followed by Dr. Bary Castilla.   Vaginal irritation--resolving areas bilat labia majora. Has d/c'd wipes, try OTC hydrocortisone crm ext prn/sitz baths. No area needing abx tx. F/u prn.         GYN counsel breast self exam, mammography screening, menopause, adequate intake of calcium and vitamin D, diet and exercise    F/U  No follow-ups on file.  Ethanjames Fontenot B. Kayah Hecker, PA-C 12/26/2021 9:06 PM

## 2021-12-28 ENCOUNTER — Ambulatory Visit (INDEPENDENT_AMBULATORY_CARE_PROVIDER_SITE_OTHER): Payer: 59 | Admitting: Obstetrics and Gynecology

## 2021-12-28 ENCOUNTER — Encounter: Payer: Self-pay | Admitting: Obstetrics and Gynecology

## 2021-12-28 VITALS — BP 146/90 | Ht 63.75 in | Wt 240.0 lb

## 2021-12-28 DIAGNOSIS — M810 Age-related osteoporosis without current pathological fracture: Secondary | ICD-10-CM | POA: Diagnosis not present

## 2021-12-28 DIAGNOSIS — C50411 Malignant neoplasm of upper-outer quadrant of right female breast: Secondary | ICD-10-CM

## 2021-12-28 DIAGNOSIS — Z01419 Encounter for gynecological examination (general) (routine) without abnormal findings: Secondary | ICD-10-CM

## 2021-12-28 DIAGNOSIS — Z1211 Encounter for screening for malignant neoplasm of colon: Secondary | ICD-10-CM

## 2021-12-28 DIAGNOSIS — Z170 Estrogen receptor positive status [ER+]: Secondary | ICD-10-CM

## 2021-12-28 DIAGNOSIS — Z1231 Encounter for screening mammogram for malignant neoplasm of breast: Secondary | ICD-10-CM

## 2021-12-28 NOTE — Patient Instructions (Signed)
I value your feedback and you entrusting us with your care. If you get a [place] [facility] patient survey, I would appreciate you taking the time to let us know about your experience today. Thank you! ? ? ?

## 2022-01-25 ENCOUNTER — Other Ambulatory Visit: Payer: Self-pay | Admitting: Internal Medicine

## 2022-01-25 DIAGNOSIS — M810 Age-related osteoporosis without current pathological fracture: Secondary | ICD-10-CM

## 2022-06-13 ENCOUNTER — Other Ambulatory Visit: Payer: Self-pay | Admitting: Surgery

## 2022-06-13 DIAGNOSIS — R918 Other nonspecific abnormal finding of lung field: Secondary | ICD-10-CM

## 2022-06-23 ENCOUNTER — Ambulatory Visit
Admission: RE | Admit: 2022-06-23 | Discharge: 2022-06-23 | Disposition: A | Discharge status: Home / Self Care | Payer: 59 | Source: Ambulatory Visit | Attending: Surgery | Admitting: Surgery

## 2022-06-23 DIAGNOSIS — R918 Other nonspecific abnormal finding of lung field: Secondary | ICD-10-CM | POA: Insufficient documentation

## 2022-08-23 ENCOUNTER — Other Ambulatory Visit: Payer: Self-pay | Admitting: Physical Medicine and Rehabilitation

## 2022-08-23 DIAGNOSIS — M5134 Other intervertebral disc degeneration, thoracic region: Secondary | ICD-10-CM

## 2022-08-23 DIAGNOSIS — M5414 Radiculopathy, thoracic region: Secondary | ICD-10-CM

## 2022-09-09 ENCOUNTER — Ambulatory Visit
Admission: RE | Admit: 2022-09-09 | Discharge: 2022-09-09 | Disposition: A | Discharge status: Home / Self Care | Payer: 59 | Source: Ambulatory Visit | Attending: Physical Medicine and Rehabilitation | Admitting: Physical Medicine and Rehabilitation

## 2022-09-09 DIAGNOSIS — M5134 Other intervertebral disc degeneration, thoracic region: Secondary | ICD-10-CM

## 2022-09-09 DIAGNOSIS — M5414 Radiculopathy, thoracic region: Secondary | ICD-10-CM

## 2022-10-17 ENCOUNTER — Other Ambulatory Visit: Payer: Self-pay | Admitting: Internal Medicine

## 2022-10-17 DIAGNOSIS — M810 Age-related osteoporosis without current pathological fracture: Secondary | ICD-10-CM

## 2022-11-24 ENCOUNTER — Other Ambulatory Visit: Payer: Self-pay | Admitting: Surgery

## 2022-11-24 DIAGNOSIS — Z1231 Encounter for screening mammogram for malignant neoplasm of breast: Secondary | ICD-10-CM

## 2022-12-06 ENCOUNTER — Other Ambulatory Visit: Payer: 59

## 2022-12-29 ENCOUNTER — Ambulatory Visit
Admission: RE | Admit: 2022-12-29 | Discharge: 2022-12-29 | Disposition: A | Discharge status: Home / Self Care | Payer: 59 | Source: Ambulatory Visit | Attending: Internal Medicine | Admitting: Internal Medicine

## 2022-12-29 ENCOUNTER — Ambulatory Visit
Admission: RE | Admit: 2022-12-29 | Discharge: 2022-12-29 | Disposition: A | Discharge status: Home / Self Care | Payer: 59 | Source: Ambulatory Visit | Attending: Surgery | Admitting: Surgery

## 2022-12-29 DIAGNOSIS — Z1231 Encounter for screening mammogram for malignant neoplasm of breast: Secondary | ICD-10-CM | POA: Insufficient documentation

## 2022-12-29 DIAGNOSIS — M810 Age-related osteoporosis without current pathological fracture: Secondary | ICD-10-CM | POA: Diagnosis present

## 2023-01-23 NOTE — Progress Notes (Unsigned)
PCP: Tammy Baseman, MD   No chief complaint on file.   HPI:      [name] is a 62 y.o. [id number] who LMP was No LMP recorded. Patient has had a hysterectomy., presents today for her annual examination.  Her menses are absent due to [facility] with Dr. Luella Turner 2011 for leio/adenomyosis.  She does not have PMB.  She does have vasomotor sx with aromasin for breast cancer but improved now.   Sex activity: single partner, contraception - post menopausal status. She does have vaginal dryness and uses coconut oil with sx relief.  Last Pap: 11/11/19 Results were: no abnormalities /neg HPV DNA. Still has cx.  Hx of STDs: none  Last mammogram: 12/29/22 was normal, repeat in 12 months.  2018 mammo was Cat 5. She had a bx with Dr. Lemar Turner and was found to have stage 1 RT breast cancer, ER/PR+. She did lumpectomy with radiation and started femara. Changed to aromasin due to bad side effects with femara. Still followed by Dr. Lemar Turner ??????. S/p breast reduction last yr.   There is no FH of breast cancer. There is a FH of ovarian cancer in her MGM. Pt tested MyRisk neg 2016. The patient does self-breast exams.  Colonoscopy: colonoscopy 7/22 with Dr. Lemar Turner, repeat due after 7 yrs; 2017 with polyps (repeat due after 5 yrs).  FH colon cancer in several fam members, including her father.  DEXA: 11/24 with osteopenia in spine and hip now; 10/22 with osteoporosis in spine and hip. 8/20 at [facility]. Doing reclast tx now with endocrine as well as ca/Vit D/exercise.    Tobacco use: The patient denies current or previous tobacco use. Alcohol use: none  No drug use Exercise: very active  She does get adequate calcium and Vitamin D in her diet.  Labs with PCP.  Past Medical History:  Diagnosis Date   Allergic state    Anemia    Atypical chest pain    a. 0/2015 Myoview: EF 64%, no ischemia.   Breast cancer of upper-outer quadrant of right female breast ([facility]) 06/2016   ER 95%, PR 90%, HER-2/neu not  overexpressed.   Complication of anesthesia    SEVERE MUSCLE ACHES FROM DIAPHRAGM TO THIGHS X 2 DAYS AFTER PARTIAL MASTECTOMY ON 07-25-16-PT WAS GIVEN SUCCINYLCHOLINE FOR INDUCTION   Dyspnea on exertion    a. 09/2013 Echo: EF 60-65%, no rwma;  b. 11/2013 Myoview: EF 64%, no ischemia; c. 09/2016 Echo: EF 55-60%, no rwma; d. 12/2019 Echo: EF 60-65%, mod asymm LVH, nl RV fxn, mildly dil LA.    Family history of ovarian cancer    Pt is My Risk cancer genetic testing neg 2016   Genetic testing 2016   My Risk negative   Gross hematuria    Headache(784.0)    Hyperlipidemia    Hypertension    Hypothyroidism    Migraines    Morbid obesity ([facility])    Osteopenia    Osteopenia    Palpitations    a. 01/2020 Zio: Avg HR 83 (47-129), rare PACs/PVCs. Six atrial runs up to 18 beats (179bpm). NSVT x 2 (up to 5 beats - 200bpm). No prolonged arrhythmias or pauses. Triggered events = sinus rhythm, w/ some PACs/PVCs.   Pernicious anemia    Personal history of radiation therapy    Sleep apnea 2010   uses CPAP   Syncope    a. 09/2016 - seen in ED - limited w/u unrevealing; b. 09/2016 Echo: EF 55-60%, no rwma;  b. 10/2016 Event monitor:  no signficant arrhythmias. Occas pac's.    Past Surgical History:  Procedure Laterality Date   ABDOMINAL HYSTERECTOMY  2011   [facility]; Dr. Luella Turner, due to leio/adenomyosis   BREAST EXCISIONAL BIOPSY Right 07/25/2016   lumpectomy radation reexcison + anterior margins 08-08-16   BREAST LUMPECTOMY Right 08/08/2016   Procedure: BREAST RE-EXCISION;  Surgeon: Tammy Mayotte, MD;  Location: [facility];  Service: General;  Laterality: Right;   BREAST REDUCTION SURGERY Bilateral 12/08/2020   Procedure: Bilateral breast reduction with free nipple graft;  Surgeon: Tammy Napoleon, MD;  Location: [facility];  Service: Plastics;  Laterality: Bilateral;   CESAREAN SECTION     x 2   CHOLECYSTECTOMY     COLONOSCOPY     COLONOSCOPY WITH PROPOFOL N/A 10/05/2015   Procedure:  COLONOSCOPY WITH PROPOFOL;  Surgeon: Tammy Jun, MD;  Location[facility] END[facility]COPY;  Service: Endoscopy;  Laterality: N/A;   COLONOSCOPY WITH PROPOFOL N/A 08/21/2020   Procedure: COLONOSCOPY WITH PROPOFOL;  Surgeon: Tammy Mayotte, MD;  Location: [facility] ENDOSCOPY;  Service: Endoscopy;  Laterality: N/A;   DILATION AND CURETTAGE OF UTERUS     FUNCTIONAL ENDOSCOPIC SINUS SURGERY     PARTIAL MASTECTOMY WITH AXILLARY SENTINEL LYMPH NODE BIOPSY Right 07/25/2016   Procedure: PARTIAL MASTECTOMY WITH AXILLARY SENTINEL LYMPH NODE BIOPSY;  Surgeon: Tammy Mayotte, MD;  Location: [facility];  Service: General;  Laterality: Right;   REDUCTION MAMMAPLASTY Bilateral 12/2020    Family History  Problem Relation Age of Onset   Pulmonary embolism Mother    Hypertension Mother    Hyperlipidemia Mother    Hypertension Father    Colon cancer Father 77   Ovarian cancer Maternal Grandmother        56   Colon cancer Paternal Grandmother        55   Colon cancer Maternal Grandfather 74   Fainting Paternal Grandfather    Breast cancer Neg Hx     Social History   Socioeconomic History   Marital status: Married    Spouse name: Not on file   Number of children: Not on file   Years of education: Not on file   Highest education level: Not on file  Occupational History   Not on file  Tobacco Use   Smoking status: Never   Smokeless tobacco: Never  Vaping Use   Vaping status: Never Used  Substance and Sexual Activity   Alcohol use: No   Drug use: No   Sexual activity: Yes    Birth control/protection: Surgical    Comment: Hysterectomy  Other Topics Concern   Not on file  Social History Narrative   Not on file   Social Drivers of Health   Financial Resource Strain: Not on file  Food Insecurity: Not on file  Transportation Needs: Not on file  Physical Activity: Not on file  Stress: Not on file  Social Connections: Not on file  Intimate Partner Violence: Not on file    No outpatient  medications have been marked as taking for the 01/26/23 encounter (Appointment) with Tammy Turner, Tammy Sorrel, PA-C.      ROS:  Review of Systems  Constitutional:  Negative for fatigue, fever and unexpected weight change.  Respiratory:  Negative for cough, shortness of breath and wheezing.   Cardiovascular:  Negative for chest pain, palpitations and leg swelling.  Gastrointestinal:  Negative for blood in stool, constipation, diarrhea, nausea and vomiting.  Endocrine: Negative for cold intolerance, heat  intolerance and polyuria.  Genitourinary:  Negative for dyspareunia, dysuria, flank pain, frequency, genital sores, hematuria, menstrual problem, pelvic pain, urgency, vaginal bleeding, vaginal discharge and vaginal pain.  Musculoskeletal:  Negative for back pain, joint swelling and myalgias.  Skin:  Negative for rash.  Neurological:  Negative for dizziness, syncope, light-headedness, numbness and headaches.  Hematological:  Negative for adenopathy.  Psychiatric/Behavioral:  Negative for agitation, confusion, dysphoric mood, sleep disturbance and suicidal ideas. The patient is not nervous/anxious.      Objective: There were no vitals taken for this visit.   Physical Exam Constitutional:      Appearance: She is well-developed.  Genitourinary:     Vulva normal.     Genitourinary Comments: UTERUS/CX SURG REM     Right Labia: No rash, tenderness or lesions.    Left Labia: No tenderness, lesions or rash.    Vaginal cuff intact.    No vaginal discharge, erythema or tenderness.      Right Adnexa: not tender and no mass present.    Left Adnexa: not tender and no mass present.    Cervix is absent.     No cervical polyp.     Uterus is not enlarged or tender.     Uterus is absent.  Breasts:    Right: No mass, nipple discharge, skin change or tenderness.     Left: No mass, nipple discharge, skin change or tenderness.  Neck:     Thyroid: No thyromegaly.  Cardiovascular:     Rate and  Rhythm: Normal rate and regular rhythm.     Heart sounds: Murmur heard.     Systolic murmur is present with a grade of 2/6.  Pulmonary:     Effort: Pulmonary effort is normal.     Breath sounds: Normal breath sounds.  Abdominal:     Palpations: Abdomen is soft.     Tenderness: There is no abdominal tenderness. There is no guarding.  Musculoskeletal:        General: Normal range of motion.     Cervical back: Normal range of motion.  Neurological:     General: No focal deficit present.     Mental Status: She is alert and oriented to person, place, and time.     Cranial Nerves: No cranial nerve deficit.  Skin:    General: Skin is warm and dry.  Psychiatric:        Mood and Affect: Mood normal.        Behavior: Behavior normal.        Thought Content: Thought content normal.        Judgment: Judgment normal.  Vitals reviewed.     Assessment/Plan:  Encounter for annual routine gynecological examination  Encounter for screening mammogram for malignant neoplasm of breast; pt current on mammo  Malignant neoplasm of upper-outer quadrant of right breast in female, estrogen receptor positive ([facility]); doing well, followed by Dr. Lemar Turner  Age-related osteoporosis without current pathological fracture--on reclast, DEXA due 2024         GYN counsel breast self exam, mammography screening, menopause, adequate intake of calcium and vitamin D, diet and exercise    F/U  No follow-ups on file.  Kashmir Lysaght B. Syd Manges, PA-C 01/23/2023 2:05 PM

## 2023-01-26 ENCOUNTER — Other Ambulatory Visit (HOSPITAL_COMMUNITY)
Admission: RE | Admit: 2023-01-26 | Discharge: 2023-01-26 | Disposition: A | Discharge status: Home / Self Care | Payer: 59 | Source: Ambulatory Visit | Attending: Obstetrics and Gynecology | Admitting: Obstetrics and Gynecology

## 2023-01-26 ENCOUNTER — Ambulatory Visit: Payer: 59 | Admitting: Obstetrics and Gynecology

## 2023-01-26 ENCOUNTER — Encounter: Payer: Self-pay | Admitting: Obstetrics and Gynecology

## 2023-01-26 VITALS — BP 135/81 | HR 73 | Ht 60.75 in | Wt 251.0 lb

## 2023-01-26 DIAGNOSIS — Z124 Encounter for screening for malignant neoplasm of cervix: Secondary | ICD-10-CM | POA: Diagnosis present

## 2023-01-26 DIAGNOSIS — Z170 Estrogen receptor positive status [ER+]: Secondary | ICD-10-CM

## 2023-01-26 DIAGNOSIS — Z1151 Encounter for screening for human papillomavirus (HPV): Secondary | ICD-10-CM | POA: Diagnosis present

## 2023-01-26 DIAGNOSIS — Z01419 Encounter for gynecological examination (general) (routine) without abnormal findings: Secondary | ICD-10-CM

## 2023-01-26 DIAGNOSIS — Z1231 Encounter for screening mammogram for malignant neoplasm of breast: Secondary | ICD-10-CM

## 2023-01-26 DIAGNOSIS — M810 Age-related osteoporosis without current pathological fracture: Secondary | ICD-10-CM

## 2023-01-26 NOTE — Patient Instructions (Signed)
I value your feedback and you entrusting us with your care. If you get a [facility] patient survey, I would appreciate you taking the time to let us know about your experience today. Thank you! ? ? ?

## 2023-01-31 LAB — CYTOLOGY - PAP
Comment: NEGATIVE
Diagnosis: NEGATIVE
High risk HPV: NEGATIVE

## 2023-04-20 ENCOUNTER — Ambulatory Visit
Admission: RE | Admit: 2023-04-20 | Discharge: 2023-04-20 | Disposition: A | Discharge status: Home / Self Care | Source: Ambulatory Visit | Attending: Surgery | Admitting: Surgery

## 2023-04-20 ENCOUNTER — Other Ambulatory Visit: Payer: Self-pay | Admitting: Surgery

## 2023-04-20 DIAGNOSIS — R590 Localized enlarged lymph nodes: Secondary | ICD-10-CM | POA: Insufficient documentation

## 2023-04-26 ENCOUNTER — Other Ambulatory Visit: Payer: Self-pay | Admitting: Surgery

## 2023-04-26 DIAGNOSIS — R221 Localized swelling, mass and lump, neck: Secondary | ICD-10-CM

## 2023-04-26 NOTE — Progress Notes (Signed)
 Irish Lack, MD sent to [facility] PROCEDURE / BIOPSY REVIEW Date: 04/25/23  Requested Biopsy site: Right neck mass Reason for request: Neck mass Imaging review: Best seen on [place]  Decision: Approved Imaging modality to perform: Ultrasound Schedule with: No sedation / Local anesthetic Schedule for: Any VIR  Additional comments:   Please contact me with questions, concerns, or if issue pertaining to this request arise.  Reola Calkins, MD [facility]

## 2023-04-28 NOTE — Progress Notes (Signed)
 Patient for US guided Core RT Neck Mass Biopsy on Mon 05/01/23, I called and spoke with the patient on the phone and gave pre-procedure instructions. Pt was made aware to be here at 12:30p and check in at the [facility] registration desk. Pt stated understanding.  Called 04/28/23

## 2023-05-01 ENCOUNTER — Ambulatory Visit
Admission: RE | Admit: 2023-05-01 | Discharge: 2023-05-01 | Disposition: A | Discharge status: Home / Self Care | Source: Ambulatory Visit | Attending: Surgery | Admitting: Surgery

## 2023-05-01 DIAGNOSIS — R221 Localized swelling, mass and lump, neck: Secondary | ICD-10-CM | POA: Insufficient documentation

## 2023-05-01 MED ORDER — LIDOCAINE HCL (PF) 1 % IJ SOLN
10.0000 mL | Freq: Once | INTRAMUSCULAR | Status: AC
  Administered 2023-05-01: 10 mL via INTRADERMAL
  Filled 2023-05-01: qty 10

## 2023-05-01 NOTE — Procedures (Signed)
 Interventional Radiology Procedure Note  Procedure: US guided biopsy of posterior neck mass.  Findings:  Imaging unchanged from comparison [place].  Characteristics suggest lipoma.   Complications: None EBL: None Specimen: to pathology  Recommendations:   - Routine wound care - Follow up pathology - Advance diet   Signed,  Gilmer Mor, DO

## 2023-05-02 LAB — SURGICAL PATHOLOGY

## 2023-05-10 ENCOUNTER — Other Ambulatory Visit: Payer: Self-pay | Admitting: Surgery

## 2023-05-10 DIAGNOSIS — R918 Other nonspecific abnormal finding of lung field: Secondary | ICD-10-CM

## 2023-05-26 ENCOUNTER — Ambulatory Visit
Admission: RE | Admit: 2023-05-26 | Discharge: 2023-05-26 | Disposition: A | Discharge status: Home / Self Care | Source: Ambulatory Visit | Attending: Surgery | Admitting: Surgery

## 2023-05-26 DIAGNOSIS — R918 Other nonspecific abnormal finding of lung field: Secondary | ICD-10-CM | POA: Insufficient documentation

## 2023-06-12 ENCOUNTER — Other Ambulatory Visit: Payer: Self-pay | Admitting: Surgery

## 2023-06-12 DIAGNOSIS — R918 Other nonspecific abnormal finding of lung field: Secondary | ICD-10-CM

## 2023-10-17 ENCOUNTER — Other Ambulatory Visit: Payer: Self-pay | Admitting: Surgery

## 2023-10-17 DIAGNOSIS — R918 Other nonspecific abnormal finding of lung field: Secondary | ICD-10-CM

## 2023-10-26 ENCOUNTER — Ambulatory Visit: Admitting: Internal Medicine

## 2023-10-27 ENCOUNTER — Encounter: Payer: Self-pay | Admitting: Internal Medicine

## 2023-10-27 ENCOUNTER — Ambulatory Visit: Attending: Internal Medicine | Admitting: Internal Medicine

## 2023-10-27 VITALS — BP 140/81 | HR 80 | Ht 63.0 in | Wt 260.6 lb

## 2023-10-27 DIAGNOSIS — I4729 Other ventricular tachycardia: Secondary | ICD-10-CM

## 2023-10-27 DIAGNOSIS — R079 Chest pain, unspecified: Secondary | ICD-10-CM | POA: Diagnosis not present

## 2023-10-27 DIAGNOSIS — I471 Supraventricular tachycardia, unspecified: Secondary | ICD-10-CM

## 2023-10-27 DIAGNOSIS — R0609 Other forms of dyspnea: Secondary | ICD-10-CM

## 2023-10-27 DIAGNOSIS — I10 Essential (primary) hypertension: Secondary | ICD-10-CM | POA: Diagnosis not present

## 2023-10-27 MED ORDER — AMLODIPINE BESYLATE 5 MG PO TABS: 5.0000 mg | ORAL_TABLET | Freq: Every day | ORAL | 3 refills | Status: DC

## 2023-10-27 NOTE — Patient Instructions (Signed)
 Medication Instructions:  Your physician recommends the following medication changes.   START TAKING: Amlodipine  5 mg by mouth daily    *If you need a refill on your cardiac medications before your next appointment, please call your pharmacy*  Lab Work: No labs ordered today    Testing/Procedures: No test ordered today   Follow-Up: At [facility], you and your health needs are our priority.  As part of our continuing mission to provide you with exceptional heart care, our providers are all part of one team.  This team includes your primary Cardiologist (physician) and Advanced Practice Providers or APPs (Physician Assistants and Nurse Practitioners) who all work together to provide you with the care you need, when you need it.  Your next appointment:   3 month(s)  Provider:   You may see [name], MD or one of the following Advanced Practice Providers on your designated Care Team:   Lonni Meager, NP Lesley Maffucci, PA-C Bernardino Bring, PA-C Cadence Franchester, PA-C Tylene Lunch, NP Barnie Hila, NP    Your physician recommends that you schedule a follow-up appointment with Pharmacist in 4-6 weeks

## 2023-10-27 NOTE — Progress Notes (Signed)
 Cardiology Office Note:  .   Date:  10/28/2023  ID:  Tammy Turner, DOB 1960-11-27, MRN [id number] PCP: Glover Lenis, MD  [facility] Providers Cardiologist:  [name], MD     History of Present Illness: Tammy Turner is a 63 y.o. female with history of hypertension, hyperlipidemia, hypothyroidism, pernicious anemia, morbid obesity, obstructive sleep apnea, and breast cancer, who presents for follow-up of chest pain, shortness of breath, and palpitations.  I last saw her in 05/2021, at which time she was feeling well.  We did not make any medication changes or pursue additional testing.  Today, [name] is concerned that she has been having elevated blood pressure readings for the last few months, up to 190/100.  She is terrified that she is going to have a stroke or heart attack.  She began experiencing severe neck pain radiating to her head in May.  She does not have any trauma but had a violent sneeze about a week before the pain began.  She has been diagnosed with cervical spine spondylosis.  She is planning to undergo some injections in the near future and hopes this will help with her pain.  She has been taking scheduled naproxen for pain control.  [name] recounts a single episode of neck pain that radiated to the upper chest a few months ago, shortly after her severe neck pains began.  She had just returned home from a long flight.  The pain resolved spontaneously and has not recurred.  She has chronic dyspnea on exertion, which is a little worse compared to a year ago.  She continues to undergo regular CTs of the chest to follow her recurrent pulmonary nodules.  She has not had any palpitations, lightheadedness, or edema.  ROS: See HPI  Studies Reviewed: SABRA   EKG Interpretation Date/Time:  Friday October 27 2023 15:40:56 EDT Ventricular Rate:  80 PR Interval:  160 QRS Duration:  88 QT Interval:  376 QTC Calculation: 433 R Axis:   6  Text  Interpretation: Normal sinus rhythm Incomplete right bundle branch block Borderline ECG When compared with ECG of 20-May-2021 No significant change was found Confirmed by Daquavion Catala, Lonni [phone]) on 10/27/2023 3:44:30 PM    Risk Assessment/Calculations:     HYPERTENSION CONTROL Vitals:   10/27/23 1533 10/27/23 1537  BP: (!) 140/81 (!) 140/81    The patient's blood pressure is elevated above target today.  In order to address the patient's elevated BP: A new medication was prescribed today.          Physical Exam:   VS:  BP (!) 140/81 (BP Location: Left Wrist, Patient Position: Sitting, Cuff Size: Normal)   Pulse 80   Ht 5' 3 (1.6 m)   Wt 260 lb 9.6 oz (118.2 kg)   SpO2 98%   BMI 46.16 kg/m    Wt Readings from Last 3 Encounters:  10/27/23 260 lb 9.6 oz (118.2 kg)  01/26/23 251 lb (113.9 kg)  12/28/21 240 lb (108.9 kg)    General:  NAD. Neck: Unable to assess JVP due to body habitus. Lungs: Clear to auscultation bilaterally without wheezes or crackles. Heart: Regular rate and rhythm without murmurs, rubs, or gallops. Abdomen: Soft, nontender, nondistended. Extremities: Trace pretibial edema bilaterally.  ASSESSMENT AND PLAN: .    Hypertension: Blood pressure mildly elevated in the office today, sometimes much higher at home.  I suspect her neck pain is contributing to this, as well as scheduled NSAID use.  We have agreed to continue current doses of losartan  and metoprolol succinate; we will add amlodipine  5 mg daily.  Sodium restriction and judicious use of NSAIDs encouraged.  Hopefully, as her neck pain improves, her blood pressure will also normalize.  Dyspnea on exertion and atypical chest pain: Chronic exertional dyspnea has become a little bit worse, though [name] thinks that her neck pain is exacerbating this.  Volume exam is difficult due to body habitus, though she does not appear grossly volume overloaded.  Weight has trended up over the last 3 years, however.   Echocardiogram in 2021 showed normal LVEF without significant valvular abnormalities.  We previously suggested performing a PET/CT, though [name] did not move forward with this as her symptoms had resolved.  We discussed ischemia evaluation and repeat echocardiogram today but have agreed to defer this in favor of working on blood pressure control and treating her cervical spondylosis.  I am reluctant to add a diuretic at this time, given risk for renal injury in the setting of scheduled NSAID and ARB use.  NSVT and PSVT: No palpitations reported.  Continue current dose of metoprolol succinate.  Morbid obesity: BMI greater than 46 and gradually trending up.  Activity has been somewhat limited on account of neck pain.  Weight loss encouraged through lifestyle modifications as tolerated.    Dispo: Follow-up in 4 to 6 weeks for blood pressure check with clinical pharmacist.  Return to see me in 3 months.  Signed, [name], MD

## 2023-10-28 ENCOUNTER — Encounter: Payer: Self-pay | Admitting: Internal Medicine

## 2023-11-07 ENCOUNTER — Ambulatory Visit
Admission: RE | Admit: 2023-11-07 | Discharge: 2023-11-07 | Disposition: A | Discharge status: Home / Self Care | Source: Ambulatory Visit | Attending: Surgery | Admitting: Surgery

## 2023-11-07 ENCOUNTER — Other Ambulatory Visit: Payer: Self-pay | Admitting: Surgery

## 2023-11-07 DIAGNOSIS — Z1231 Encounter for screening mammogram for malignant neoplasm of breast: Secondary | ICD-10-CM

## 2023-11-07 DIAGNOSIS — R918 Other nonspecific abnormal finding of lung field: Secondary | ICD-10-CM

## 2023-11-08 ENCOUNTER — Encounter: Payer: Self-pay | Admitting: Internal Medicine

## 2023-11-09 ENCOUNTER — Other Ambulatory Visit: Payer: Self-pay | Admitting: Surgery

## 2023-11-09 DIAGNOSIS — R918 Other nonspecific abnormal finding of lung field: Secondary | ICD-10-CM

## 2023-11-09 MED ORDER — AMLODIPINE BESYLATE 5 MG PO TABS: 2.5000 mg | ORAL_TABLET | Freq: Every day | ORAL | 3 refills | Status: AC

## 2023-11-09 NOTE — Telephone Encounter (Signed)
Medication list updated to reflect new orders. 

## 2023-11-09 NOTE — Telephone Encounter (Signed)
 Please have Tammy Turner cut amlodipine  in half to 2.5 mg daily and to be extra mindful of her sodium intake.  If her leg swelling does not improve over the next few days, she should stop amlodipine  and contact us  for further evaluation.  [name], MD [facility]

## 2023-11-13 ENCOUNTER — Ambulatory Visit
Admission: RE | Admit: 2023-11-13 | Discharge: 2023-11-13 | Disposition: A | Discharge status: Home / Self Care | Source: Ambulatory Visit | Attending: Surgery | Admitting: Surgery

## 2023-11-13 DIAGNOSIS — R918 Other nonspecific abnormal finding of lung field: Secondary | ICD-10-CM | POA: Insufficient documentation

## 2023-12-11 ENCOUNTER — Telehealth: Payer: Self-pay | Admitting: Internal Medicine

## 2023-12-11 NOTE — Telephone Encounter (Signed)
 Pt called to cancel pharmacy appt due to schedule conflict and would like to know of other options since next appt is not until after she visits Dr End please advise

## 2023-12-11 NOTE — Telephone Encounter (Signed)
 Spoke to pt - reports BP now 120s/low70s and dizziness has gone away after half-ing amlodipine  dose to 2.5  Pt has appt with clinical pharmacist AFTER Dec appt with Dr End - advised to keep both appts and evaluate after Dr End appt necessity

## 2023-12-12 ENCOUNTER — Ambulatory Visit: Admitting: Pharmacist

## 2024-01-01 ENCOUNTER — Encounter

## 2024-01-08 ENCOUNTER — Ambulatory Visit
Admission: RE | Admit: 2024-01-08 | Discharge: 2024-01-08 | Disposition: A | Discharge status: Home / Self Care | Source: Ambulatory Visit | Attending: Surgery | Admitting: Surgery

## 2024-01-08 DIAGNOSIS — Z1231 Encounter for screening mammogram for malignant neoplasm of breast: Secondary | ICD-10-CM | POA: Insufficient documentation

## 2024-01-18 ENCOUNTER — Ambulatory Visit (INDEPENDENT_AMBULATORY_CARE_PROVIDER_SITE_OTHER)

## 2024-01-18 ENCOUNTER — Ambulatory Visit: Admitting: Internal Medicine

## 2024-01-18 DIAGNOSIS — D1801 Hemangioma of skin and subcutaneous tissue: Secondary | ICD-10-CM | POA: Diagnosis not present

## 2024-01-18 DIAGNOSIS — L821 Other seborrheic keratosis: Secondary | ICD-10-CM

## 2024-01-18 DIAGNOSIS — D229 Melanocytic nevi, unspecified: Secondary | ICD-10-CM

## 2024-01-18 DIAGNOSIS — W908XXA Exposure to other nonionizing radiation, initial encounter: Secondary | ICD-10-CM | POA: Diagnosis not present

## 2024-01-18 DIAGNOSIS — L578 Other skin changes due to chronic exposure to nonionizing radiation: Secondary | ICD-10-CM | POA: Diagnosis not present

## 2024-01-18 DIAGNOSIS — L814 Other melanin hyperpigmentation: Secondary | ICD-10-CM

## 2024-01-18 DIAGNOSIS — Z1283 Encounter for screening for malignant neoplasm of skin: Secondary | ICD-10-CM

## 2024-01-18 NOTE — Patient Instructions (Addendum)
 Sunscreen  Who needs sunscreen? Everyone. Sunscreen use can help prevent skin cancer by protecting you from the sun's harmful ultraviolet rays. Anyone can get skin cancer, regardless of age, gender or race. In fact, it is estimated that one in five Americans will develop skin cancer in their lifetime.  Sunscreen alone cannot fully protect you. In addition to wearing sunscreen, dermatologists recommend taking the following steps to protect your skin and find skin cancer early:  Seek shade when appropriate, remembering that the sun's rays are strongest between 10 a.m. and 2 p.m. If your shadow is shorter than you are, seek shade. Dress to protect yourself from the sun by wearing a lightweight long-sleeved shirt, pants, a wide-brimmed hat and sunglasses, when possible.  Use extra caution near water, snow and sand as they reflect the damaging rays of the sun, which can increase your chance of sunburn.  Get vitamin D safely through a healthy diet that may include vitamin supplements. Don't seek the sun. Avoid tanning beds. Ultraviolet light from the sun and tanning beds can cause skin cancer and wrinkling. If you want to look tan, you may wish to use a self-tanning product, but continue to use sunscreen with it.  When should I use sunscreen? Every day you go outside--even if you're just walking to and from your form of transportation. The sun emits harmful UV rays year-round. Even on cloudy days, up to 80 percent of the sun's harmful UV rays can penetrate your skin. Snow, sand and water increase the need for sunscreen because they reflect the sun's rays.  How much sunscreen should I use, and how often should I apply it? Most people only apply 25-50 percent of the recommended amount of sunscreen. Apply enough sunscreen to cover all exposed skin. Most adults need about 1 ounce -- or enough to fill a shot glass -- to fully cover their body.  Don't forget to apply to the tops of your feet, your neck, your ears  and the top of your head. Apply sunscreen to dry skin 15 minutes before going outdoors.  Skin cancer also can form on the lips. To protect your lips, apply a lip balm or lipstick that contains sunscreen with an SPF of 30 or higher.  When outdoors, reapply sunscreen approximately every two hours, or after swimming or sweating, according to the directions on the bottle.   Broad-spectrum sunscreens protect against both UVA and UVB rays. What is the difference between the rays? Sunlight consists of two types of harmful rays that reach the earth -- UVA rays and UVB rays. Overexposure to either can lead to skin cancer. In addition to causing skin cancer, here's what each of these rays do:  UVA rays (or aging rays) can prematurely age your skin, causing wrinkles and age spots, and can pass through window glass. UVB rays (or burning rays) are the primary cause of sunburn and are blocked by window glass  There is no safe way to tan. Every time you tan, you damage your skin. As this damage builds, you speed up the aging of your skin and increase your risk for all types of skin cancer.  What is the difference between chemical and physical sunscreens? Chemical sunscreens work like a sponge, absorbing the sun's rays. They contain one or more of the following active ingredients: oxybenzone, avobenzone, octisalate, octocrylene, homosalate and octinoxate. These formulations tend to be easier to rub into the skin without leaving a white residue.   Physical sunscreens work like a shield,  sitting sit on the surface of your skin and deflecting the sun's rays. They contain the active ingredients zinc oxide and/or titanium dioxide. Use this sunscreen if you have sensitive skin.   What type of sunscreen should I use? The best type of sunscreen is the one you will use again and again. Just make sure it offers broad-spectrum (UVA and UVB) protection, has an SPF of 30+, and is water-resistant. The kind of sunscreen you use is  a matter of personal choice, and may vary depending on the area of the body to be protected. Available sunscreen options include lotions, creams, gels, ointments, wax sticks and sprays.  Recommended physical sunscreens for face: - Neutrogena Sheer Zinc - Aveeno Positively Mineral Sensitive - CeraVe Hydrating Mineral (also has a tinted version) - La Roche-Posay Anthelios Mineral Face (comes as a cream, lotion, light fluid, and there is also a tinted version).  - EltaMD UV Clear (also has a tinted version)  Recommended physical sunscreens for body: - Neutrogena Sheer Zinc Dry-Touch Sunscreen Sensitive Skin Lotion Broad Spectrum SPF 50 - Aveeno Positively Mineral Sensitive Skin Sunscreen Broad Spectrum SPF 50 - La Roche-Posay Anthelios SPF 50 Mineral Sunscreen - Gentle Lotion - CeraVe Hydrating Mineral Sunscreen SPF 50  Recommended chemical sunscreens for face: - Anthelios UV Correct Face Sunscreen SPF 70 with Niacinamide - Neutrogena Clear Face Oil-Free SPF 50 with Helioplex - Neutrogena Sport Face Oil-Free SPF 70+ with Helioplex - Aveeno Protect + Hydrate Sunscreen For Face SPF 70 - La Roche-Posay Anthelios Light Fluid Sunscreen for Face SPF 60  Recommended chemical sunscreens for body: - Neutrogena Ultra Sheer Dry-Touch Sunscreen SPF 70 - Aveeno Protect + Hydrate Broad Spectrum All-Day Hydration SPF 60 (comes in a big pump) - La Roche-Posay Anthelios Melt-In Milk Sunscreen SPF 60   Melanoma ABCDEs  Melanoma is the most dangerous type of skin cancer, and is the leading cause of death from skin disease.  You are more likely to develop melanoma if you: Have light-colored skin, light-colored eyes, or red or blond hair Spend a lot of time in the sun Tan regularly, either outdoors or in a tanning bed Have had blistering sunburns, especially during childhood Have a close family member who has had a melanoma Have atypical moles or large birthmarks  Early detection of melanoma is key  since treatment is typically straightforward and cure rates are extremely high if we catch it early.   The first sign of melanoma is often a change in a mole or a new dark spot.  The ABCDE system is a way of remembering the signs of melanoma.  A for asymmetry:  The two halves do not match. B for border:  The edges of the growth are irregular. C for color:  A mixture of colors are present instead of an even brown color. D for diameter:  Melanomas are usually (but not always) greater than 6mm - the size of a pencil eraser. E for evolution:  The spot keeps changing in size, shape, and color.  Please check your skin once per month between visits. You can use a small mirror in front and a large mirror behind you to keep an eye on the back side or your body.   If you see any new or changing lesions before your next follow-up, please call to schedule a visit.  Please continue daily skin protection including broad spectrum sunscreen SPF 30+ to sun-exposed areas, reapplying every 2 hours as needed when you're outdoors.   Staying in  the shade or wearing long sleeves, sun glasses (UVA+UVB protection) and wide brim hats (4-inch brim around the entire circumference of the hat) are also recommended for sun protection.    Due to recent changes in healthcare laws, you may see results of your pathology and/or laboratory studies on MyChart before the doctors have had a chance to review them. We understand that in some cases there may be results that are confusing or concerning to you. Please understand that not all results are received at the same time and often the doctors may need to interpret multiple results in order to provide you with the best plan of care or course of treatment. Therefore, we ask that you please give us  2 business days to thoroughly review all your results before contacting the office for clarification. Should we see a critical lab result, you will be contacted sooner.   If You Need  Anything After Your Visit  If you have any questions or concerns for your doctor, please call our main line at [phone] and press option 4 to reach your doctor's medical assistant. If no one answers, please leave a voicemail as directed and we will return your call as soon as possible. Messages left after 4 pm will be answered the following business day.   You may also send us  a message via MyChart. We typically respond to MyChart messages within 1-2 business days.  For prescription refills, please ask your pharmacy to contact our office. Our fax number is [phone].  If you have an urgent issue when the clinic is closed that cannot wait until the next business day, you can page your doctor at the number below.    Please note that while we do our best to be available for urgent issues outside of office hours, we are not available 24/7.   If you have an urgent issue and are unable to reach us , you may choose to seek medical care at your doctor's office, retail clinic, urgent care center, or emergency room.  If you have a medical emergency, please immediately call 911 or go to the emergency department.  Pager Numbers  - Dr. Hester: [phone]  - Dr. Jackquline: [phone]  - Dr. Claudene: [phone]   In the event of inclement weather, please call our main line at [phone] for an update on the status of any delays or closures.  Dermatology Medication Tips: Please keep the boxes that topical medications come in in order to help keep track of the instructions about where and how to use these. Pharmacies typically print the medication instructions only on the boxes and not directly on the medication tubes.   If your medication is too expensive, please contact our office at [phone] option 4 or send us  a message through MyChart.   We are unable to tell what your co-pay for medications will be in advance as this is different depending on your insurance coverage. However, we  may be able to find a substitute medication at lower cost or fill out paperwork to get insurance to cover a needed medication.   If a prior authorization is required to get your medication covered by your insurance company, please allow us  1-2 business days to complete this process.  Drug prices often vary depending on where the prescription is filled and some pharmacies may offer cheaper prices.  The website www.goodrx.com contains coupons for medications through different pharmacies. The prices here do not account for what the cost may be with help from insurance (  it may be cheaper with your insurance), but the website can give you the price if you did not use any insurance.  - You can print the associated coupon and take it with your prescription to the pharmacy.  - You may also stop by our office during regular business hours and pick up a GoodRx coupon card.  - If you need your prescription sent electronically to a different pharmacy, notify our office through [facility], [place] or by phone at [phone] option 4.     Si Usted Necesita Algo Despus de Su Visita  Tambin puede enviarnos un mensaje a travs de Clinical cytogeneticist. Por lo general respondemos a los mensajes de MyChart en el transcurso de 1 a 2 das hbiles.  Para renovar recetas, por favor pida a su farmacia que se ponga en contacto con nuestra oficina. Randi lakes de fax es [place] [phone].  Si tiene un asunto urgente cuando la clnica est cerrada y que no puede esperar hasta el siguiente da hbil, puede llamar/localizar a su doctor(a) al nmero que aparece a continuacin.   Por favor, tenga en cuenta que aunque hacemos todo lo posible para estar disponibles para asuntos urgentes fuera del horario de [place], no estamos disponibles las 24 horas del da, los 7 [address]  [address] de la [place].   Si tiene un problema urgente y no puede comunicarse con nosotros, puede optar por buscar atencin mdica  en el consultorio de su doctor(a), en una  clnica privada, en un centro de atencin urgente o en una sala de emergencias.  Si tiene Engineer, drilling, por favor llame inmediatamente al 911 o vaya a la sala de emergencias.  Nmeros de bper  - Dr. Hester: [phone]  - Dra. Jackquline: [phone]  - Dr. Claudene: [phone]   En caso de inclemencias del tiempo, por favor llame a landry capes principal al [phone] para una actualizacin sobre el [place] de cualquier retraso o cierre.  Consejos para la medicacin en dermatologa: Por favor, guarde las cajas en las que vienen los medicamentos de uso tpico para ayudarle a seguir las instrucciones sobre dnde y cmo usarlos. Las farmacias generalmente imprimen las instrucciones del medicamento slo en las cajas y no directamente en los tubos del [place].   Si su medicamento es muy caro, por favor, pngase en contacto con landry rieger llamando al [phone] y presione la opcin 4 o envenos un mensaje a travs de Clinical cytogeneticist.   No podemos decirle cul ser su copago por los medicamentos por adelantado ya que esto es diferente dependiendo de la cobertura de su seguro. Sin embargo, es posible que podamos encontrar un medicamento sustituto a Audiological scientist un formulario para que el seguro cubra el medicamento que se considera necesario.   Si se requiere una autorizacin previa para que su compaa de seguros malta su medicamento, por favor permtanos de 1 a 2 das hbiles para completar este proceso.  Los precios de los medicamentos varan con frecuencia dependiendo del Environmental consultant de dnde se surte la receta y alguna farmacias pueden ofrecer precios ms baratos.  El sitio web www.goodrx.com tiene cupones para medicamentos de Health and safety inspector. Los precios aqu no tienen en cuenta lo que podra costar con la ayuda del seguro (puede ser ms barato con su seguro), pero el sitio web puede darle el precio si no utiliz Tourist information centre manager.  - Puede imprimir el cupn correspondiente  y llevarlo con su receta a la farmacia.  - Tambin puede pasar por nuestra oficina durante el horario de  atencin regular y Education officer, museum una tarjeta de cupones de GoodRx.  - Si necesita que su receta se enve electrnicamente a una farmacia diferente, informe a nuestra oficina a travs de MyChart de [place] o por telfono llamando al [phone] y presione la opcin 4.

## 2024-01-18 NOTE — Progress Notes (Signed)
°  °  Subjective   Tammy Turner is a 63 y.o. female who presents for the following: Total body skin exam for skin cancer screening and mole check. The patient has spots, moles and lesions to be evaluated, some may be new or changing and the patient may have concern these could be cancer. Patient is new patient.  Today patient reports: Patient is wanting to establish care with us , patient's previous dermatologist retired and became a patient at [facility]. Patient with hx of breast cancer. Last skin check done one year ago. LOC at back. Family hx skin cancer- father.   Review of Systems:    No other skin or systemic complaints except as noted in HPI or Assessment and Plan.  The following portions of the chart were reviewed this encounter and updated as appropriate: medications, allergies, medical history  Relevant Medical History:  Family history of skin cancer - Father   Objective  (SKPE) Well appearing patient in no apparent distress; mood and affect are within normal limits. Examination was performed of the: Full Skin Examination: scalp, head, eyes, ears, nose, lips, neck, chest, axillae, abdomen, back, buttocks, bilateral upper extremities, bilateral lower extremities, hands, feet, fingers, toes, fingernails, and toenails.   Examination notable for: SKIN EXAM, Angioma(s): Scattered red vascular papule(s)  , Lentigo/lentigines: Scattered pigmented macules that are tan to brown in color and are somewhat non-uniform in shape and concentrated in the sun-exposed areas, Nevus/nevi: Scattered well-demarcated, regular, pigmented macule(s) and/or papule(s)  , Seborrheic Keratosis(es): Stuck-on appearing keratotic papule(s) on the trunk, none  irritated with redness, crusting, edema, and/or partial avulsion, Actinic Damage/Elastosis: chronic sun damage: dyspigmentation, telangiectasia, and wrinkling  Examination limited by: Undergarments     Assessment & Plan  (SKAP)   SKIN CANCER  SCREENING PERFORMED TODAY.  BENIGN SKIN FINDINGS  - Lentigines  - Seborrheic keratoses  - Hemangiomas   - Nevus/Multiple Benign Nevi - Reassurance provided regarding the benign appearance of lesions noted on exam today; no treatment is indicated in the absence of symptoms/changes. - Reinforced importance of photoprotective strategies including liberal and frequent sunscreen use of a broad-spectrum SPF 30 or greater, use of protective clothing, and sun avoidance for prevention of cutaneous malignancy and photoaging.  Counseled patient on the importance of regular self-skin monitoring as well as routine clinical skin examinations as scheduled.   ACTINIC DAMAGE - Chronic condition, secondary to cumulative UV/sun exposure - Recommend daily broad spectrum sunscreen SPF 30+ to sun-exposed areas, reapply every 2 hours as needed.  - Staying in the shade or wearing long sleeves, sun glasses (UVA+UVB protection) and wide brim hats (4-inch brim around the entire circumference of the hat) are also recommended for sun protection.  - Call for new or changing lesions.   Level of service outlined above   Patient instructions (SKPI)   Procedures, orders, diagnosis for this visit:    There are no diagnoses linked to this encounter.  Return to clinic: Return in about 1 year (around 01/17/2025) for TBSE, w/ Dr. Raymund.  I, Jacquelynn V. Wilfred, CMA, am acting as scribe for Lauraine JAYSON Raymund, MD.  Documentation: I have reviewed the above documentation for accuracy and completeness, and I agree with the above.  Lauraine JAYSON Raymund, MD

## 2024-04-03 ENCOUNTER — Ambulatory Visit: Admitting: Internal Medicine

## 2025-01-16 ENCOUNTER — Ambulatory Visit
